# Patient Record
Sex: Female | Born: 1950
Health system: Southern US, Community
[De-identification: ages and names within clinical notes are randomized; demographics above are authoritative.]

## PROBLEM LIST (undated history)

## (undated) DIAGNOSIS — M1990 Unspecified osteoarthritis, unspecified site: Secondary | ICD-10-CM

## (undated) DIAGNOSIS — M259 Joint disorder, unspecified: Secondary | ICD-10-CM

## (undated) DIAGNOSIS — H4311 Vitreous hemorrhage, right eye: Secondary | ICD-10-CM

## (undated) DIAGNOSIS — F4310 Post-traumatic stress disorder, unspecified: Secondary | ICD-10-CM

## (undated) DIAGNOSIS — F410 Panic disorder [episodic paroxysmal anxiety] without agoraphobia: Secondary | ICD-10-CM

## (undated) DIAGNOSIS — K648 Other hemorrhoids: Secondary | ICD-10-CM

## (undated) DIAGNOSIS — G459 Transient cerebral ischemic attack, unspecified: Secondary | ICD-10-CM

## (undated) DIAGNOSIS — R112 Nausea with vomiting, unspecified: Secondary | ICD-10-CM

## (undated) DIAGNOSIS — R51 Headache: Secondary | ICD-10-CM

## (undated) DIAGNOSIS — T4145XA Adverse effect of unspecified anesthetic, initial encounter: Secondary | ICD-10-CM

## (undated) DIAGNOSIS — Z9889 Other specified postprocedural states: Secondary | ICD-10-CM

## (undated) DIAGNOSIS — D509 Iron deficiency anemia, unspecified: Secondary | ICD-10-CM

## (undated) DIAGNOSIS — D649 Anemia, unspecified: Secondary | ICD-10-CM

## (undated) DIAGNOSIS — T8859XA Other complications of anesthesia, initial encounter: Secondary | ICD-10-CM

## (undated) DIAGNOSIS — Z800 Family history of malignant neoplasm of digestive organs: Secondary | ICD-10-CM

## (undated) DIAGNOSIS — M539 Dorsopathy, unspecified: Secondary | ICD-10-CM

## (undated) DIAGNOSIS — K5790 Diverticulosis of intestine, part unspecified, without perforation or abscess without bleeding: Secondary | ICD-10-CM

## (undated) DIAGNOSIS — J45909 Unspecified asthma, uncomplicated: Secondary | ICD-10-CM

## (undated) DIAGNOSIS — D126 Benign neoplasm of colon, unspecified: Secondary | ICD-10-CM

## (undated) DIAGNOSIS — Z803 Family history of malignant neoplasm of breast: Secondary | ICD-10-CM

## (undated) DIAGNOSIS — K219 Gastro-esophageal reflux disease without esophagitis: Secondary | ICD-10-CM

## (undated) DIAGNOSIS — I10 Essential (primary) hypertension: Secondary | ICD-10-CM

## (undated) HISTORY — DX: Benign neoplasm of colon, unspecified: D12.6

## (undated) HISTORY — DX: Dorsopathy, unspecified: M53.9

## (undated) HISTORY — DX: Post-traumatic stress disorder, unspecified: F43.10

## (undated) HISTORY — DX: Family history of malignant neoplasm of digestive organs: Z80.0

## (undated) HISTORY — DX: Other hemorrhoids: K64.8

## (undated) HISTORY — PX: COLONOSCOPY: SHX174

## (undated) HISTORY — DX: Family history of malignant neoplasm of breast: Z80.3

## (undated) HISTORY — DX: Iron deficiency anemia, unspecified: D50.9

## (undated) HISTORY — PX: COLONOSCOPY W/ BIOPSIES AND POLYPECTOMY: SHX1376

## (undated) HISTORY — DX: Diverticulosis of intestine, part unspecified, without perforation or abscess without bleeding: K57.90

## (undated) HISTORY — DX: Transient cerebral ischemic attack, unspecified: G45.9

## (undated) HISTORY — DX: Unspecified asthma, uncomplicated: J45.909

## (undated) HISTORY — PX: DILATION AND CURETTAGE OF UTERUS: SHX78

## (undated) HISTORY — DX: Joint disorder, unspecified: M25.9

## (undated) HISTORY — DX: Panic disorder (episodic paroxysmal anxiety): F41.0

## (undated) HISTORY — PX: BREAST SURGERY: SHX581

## (undated) HISTORY — PX: ABDOMINAL HYSTERECTOMY: SHX81

---

## 1997-09-20 HISTORY — PX: OTHER SURGICAL HISTORY: SHX169

## 2005-02-27 ENCOUNTER — Emergency Department (HOSPITAL_COMMUNITY): Admission: EM | Admit: 2005-02-27 | Discharge: 2005-02-27 | Payer: Self-pay | Admitting: Family Medicine

## 2005-09-06 ENCOUNTER — Emergency Department (HOSPITAL_COMMUNITY): Admission: EM | Admit: 2005-09-06 | Discharge: 2005-09-06 | Payer: Self-pay | Admitting: Family Medicine

## 2005-09-22 ENCOUNTER — Emergency Department (HOSPITAL_COMMUNITY): Admission: EM | Admit: 2005-09-22 | Discharge: 2005-09-22 | Payer: Self-pay | Admitting: Emergency Medicine

## 2005-12-06 ENCOUNTER — Other Ambulatory Visit: Admission: RE | Admit: 2005-12-06 | Discharge: 2005-12-06 | Payer: Self-pay | Admitting: Obstetrics and Gynecology

## 2005-12-15 ENCOUNTER — Encounter: Admission: RE | Admit: 2005-12-15 | Discharge: 2005-12-15 | Payer: Self-pay | Admitting: Obstetrics and Gynecology

## 2007-06-13 ENCOUNTER — Emergency Department (HOSPITAL_COMMUNITY): Admission: EM | Admit: 2007-06-13 | Discharge: 2007-06-13 | Payer: Self-pay | Admitting: Emergency Medicine

## 2007-06-23 ENCOUNTER — Ambulatory Visit: Payer: Self-pay | Admitting: Family Medicine

## 2007-06-29 ENCOUNTER — Ambulatory Visit: Payer: Self-pay | Admitting: *Deleted

## 2007-07-19 ENCOUNTER — Encounter: Admission: RE | Admit: 2007-07-19 | Discharge: 2007-07-19 | Payer: Self-pay | Admitting: Family Medicine

## 2007-07-26 ENCOUNTER — Encounter: Admission: RE | Admit: 2007-07-26 | Discharge: 2007-07-26 | Payer: Self-pay | Admitting: Family Medicine

## 2007-07-26 HISTORY — PX: BREAST BIOPSY: SHX20

## 2007-08-21 HISTORY — PX: BREAST EXCISIONAL BIOPSY: SUR124

## 2007-09-07 ENCOUNTER — Encounter: Admission: RE | Admit: 2007-09-07 | Discharge: 2007-09-07 | Payer: Self-pay | Admitting: General Surgery

## 2007-09-11 ENCOUNTER — Encounter (INDEPENDENT_AMBULATORY_CARE_PROVIDER_SITE_OTHER): Payer: Self-pay | Admitting: General Surgery

## 2007-09-11 ENCOUNTER — Ambulatory Visit (HOSPITAL_BASED_OUTPATIENT_CLINIC_OR_DEPARTMENT_OTHER): Admission: RE | Admit: 2007-09-11 | Discharge: 2007-09-11 | Payer: Self-pay | Admitting: General Surgery

## 2007-09-11 ENCOUNTER — Encounter: Admission: RE | Admit: 2007-09-11 | Discharge: 2007-09-11 | Payer: Self-pay | Admitting: General Surgery

## 2008-07-19 ENCOUNTER — Encounter: Admission: RE | Admit: 2008-07-19 | Discharge: 2008-07-19 | Payer: Self-pay | Admitting: Internal Medicine

## 2008-08-28 ENCOUNTER — Encounter: Admission: RE | Admit: 2008-08-28 | Discharge: 2008-09-19 | Payer: Self-pay | Admitting: Internal Medicine

## 2009-06-29 ENCOUNTER — Emergency Department (HOSPITAL_COMMUNITY): Admission: EM | Admit: 2009-06-29 | Discharge: 2009-06-29 | Payer: Self-pay | Admitting: Emergency Medicine

## 2009-08-19 ENCOUNTER — Ambulatory Visit: Payer: Self-pay | Admitting: Family Medicine

## 2009-08-19 LAB — CONVERTED CEMR LAB
ALT: 10 units/L (ref 0–35)
AST: 13 units/L (ref 0–37)
Albumin: 4.2 g/dL (ref 3.5–5.2)
Basophils Relative: 0 % (ref 0–1)
CO2: 26 meq/L (ref 19–32)
Calcium: 9 mg/dL (ref 8.4–10.5)
Chloride: 107 meq/L (ref 96–112)
Creatinine, Ser: 0.69 mg/dL (ref 0.40–1.20)
Hemoglobin: 12.4 g/dL (ref 12.0–15.0)
Lymphocytes Relative: 23 % (ref 12–46)
Lymphs Abs: 1.4 10*3/uL (ref 0.7–4.0)
MCHC: 31 g/dL (ref 30.0–36.0)
Microalb, Ur: 1.22 mg/dL (ref 0.00–1.89)
Monocytes Absolute: 0.5 10*3/uL (ref 0.1–1.0)
Monocytes Relative: 8 % (ref 3–12)
Neutro Abs: 4.1 10*3/uL (ref 1.7–7.7)
Neutrophils Relative %: 67 % (ref 43–77)
Potassium: 3.7 meq/L (ref 3.5–5.3)
RBC: 4.85 M/uL (ref 3.87–5.11)
Sed Rate: 19 mm/hr (ref 0–22)
Sodium: 146 meq/L — ABNORMAL HIGH (ref 135–145)
TSH: 0.532 microintl units/mL (ref 0.350–4.500)
Total Protein: 6.5 g/dL (ref 6.0–8.3)
WBC: 6.1 10*3/uL (ref 4.0–10.5)

## 2009-10-14 ENCOUNTER — Ambulatory Visit (HOSPITAL_COMMUNITY): Admission: RE | Admit: 2009-10-14 | Discharge: 2009-10-14 | Payer: Self-pay | Admitting: Family Medicine

## 2009-10-14 ENCOUNTER — Ambulatory Visit: Payer: Self-pay | Admitting: Family Medicine

## 2009-11-07 ENCOUNTER — Ambulatory Visit: Payer: Self-pay | Admitting: Family Medicine

## 2009-11-20 ENCOUNTER — Ambulatory Visit (HOSPITAL_COMMUNITY): Admission: RE | Admit: 2009-11-20 | Discharge: 2009-11-20 | Payer: Self-pay | Admitting: Internal Medicine

## 2009-11-20 ENCOUNTER — Ambulatory Visit: Payer: Self-pay | Admitting: Family Medicine

## 2009-11-25 ENCOUNTER — Ambulatory Visit: Payer: Self-pay | Admitting: Family Medicine

## 2009-11-25 ENCOUNTER — Encounter: Admission: RE | Admit: 2009-11-25 | Discharge: 2009-11-25 | Payer: Self-pay | Admitting: Family Medicine

## 2009-11-29 ENCOUNTER — Encounter: Admission: RE | Admit: 2009-11-29 | Discharge: 2009-11-29 | Payer: Self-pay | Admitting: Family Medicine

## 2009-12-17 ENCOUNTER — Ambulatory Visit: Payer: Self-pay | Admitting: Obstetrics and Gynecology

## 2009-12-23 ENCOUNTER — Ambulatory Visit (HOSPITAL_COMMUNITY): Admission: RE | Admit: 2009-12-23 | Discharge: 2009-12-23 | Payer: Self-pay | Admitting: Family Medicine

## 2009-12-25 ENCOUNTER — Ambulatory Visit: Payer: Self-pay | Admitting: Family Medicine

## 2009-12-26 ENCOUNTER — Encounter: Admission: RE | Admit: 2009-12-26 | Discharge: 2009-12-26 | Payer: Self-pay | Admitting: Family Medicine

## 2010-01-13 ENCOUNTER — Encounter: Admission: RE | Admit: 2010-01-13 | Discharge: 2010-02-23 | Payer: Self-pay | Admitting: Family Medicine

## 2010-02-27 ENCOUNTER — Ambulatory Visit: Payer: Self-pay | Admitting: Family Medicine

## 2010-06-22 ENCOUNTER — Encounter: Admission: RE | Admit: 2010-06-22 | Discharge: 2010-06-22 | Payer: Self-pay | Admitting: Family Medicine

## 2011-02-02 NOTE — Op Note (Signed)
NAME:  Sheila Hartman, Sheila Hartman           ACCOUNT NO.:  [id number]   MEDICAL RECORD NO.:  [id number]          PATIENT TYPE:  AMB   LOCATION:  DSC                          FACILITY:  [facility]   PHYSICIAN:  Timothy E. Earlene Plater, M.D. DATE OF BIRTH:  05-05-1951   DATE OF PROCEDURE:  09/11/2007  DATE OF DISCHARGE:  09/11/2007                               OPERATIVE REPORT   PREOP DIAGNOSIS:  Calcifications, left breast.   POSTOPERATIVE DIAGNOSIS:  Calcifications, left breast.   PROCEDURE:  Needle-localized excision calcifications, left breast.   SURGEON:  Timothy E. Earlene Plater, M.D.   ANESTHESIA:  Local standby.   DESCRIPTION OF PROCEDURE:  [name] is 34, and has her first abnormal  mammogram with calcifications, left breast.  The patient is to large to  have a stereotactic biopsy, but she is a candidate for a free hand  needle localization, and surgical excision.  She has agreed, and is here  today for that.   The patient is seen, identified, the left breast marked, and the permit  signed.  She had been at the [facility] for an  excellent needle localization, Dr. Deboraha Sprang.   The patient was taken to the operating room, placed supine.  IV sedation  given.  The left breast was examined.  The bandage and tape removed, the  needle cut to length.  The area prepped and draped with Betadine.  The  wire entered the left breast from the medial aspect, going posterior and  laterally.  This area was measured, compared to the mammograms, and the  skin and subcu were anesthetized with 1/4% Marcaine.  A curvilinear  incision made over the needle entrance and medially.  I followed the  needle through the superficial subcu, and then widened the excision  around the shaft of the needle removing the tissue, the needle, the  shaft, and the point of the localizing wire.  There was a hematoma along  the shaft.  This area was marked #1, deep was marked #2, and superior  was marked #3.  An additional  rim of tissue was taken inferiorly along  the hematoma at the shaft of the needle.   Cautery was used to keep the wound dry; and it was approximated deep and  superficial with Monocryl and Steri-Strips.  I looked at the x-ray, all  of calcifications, and wire were included.  Counts were correct.  She  tolerated it well, and was removed to recovery room in good condition.  Vicodin and instructions given.  She will be followed as an outpatient.      Timothy E. Earlene Plater, M.D.  Electronically Signed     TED/MEDQ  D:  09/11/2007  T:  09/11/2007  Job:  [id number]

## 2011-04-06 ENCOUNTER — Other Ambulatory Visit: Payer: Self-pay | Admitting: Internal Medicine

## 2011-04-06 DIAGNOSIS — R921 Mammographic calcification found on diagnostic imaging of breast: Secondary | ICD-10-CM

## 2011-04-09 ENCOUNTER — Ambulatory Visit
Admission: RE | Admit: 2011-04-09 | Discharge: 2011-04-09 | Disposition: A | Discharge status: Home / Self Care | Payer: Self-pay | Source: Ambulatory Visit | Attending: Internal Medicine | Admitting: Internal Medicine

## 2011-04-09 DIAGNOSIS — R921 Mammographic calcification found on diagnostic imaging of breast: Secondary | ICD-10-CM

## 2011-06-02 ENCOUNTER — Inpatient Hospital Stay (INDEPENDENT_AMBULATORY_CARE_PROVIDER_SITE_OTHER)
Admission: RE | Admit: 2011-06-02 | Discharge: 2011-06-02 | Disposition: A | Discharge status: Home / Self Care | Payer: Self-pay | Source: Ambulatory Visit | Attending: Family Medicine | Admitting: Family Medicine

## 2011-06-02 DIAGNOSIS — R6889 Other general symptoms and signs: Secondary | ICD-10-CM

## 2011-06-02 DIAGNOSIS — I10 Essential (primary) hypertension: Secondary | ICD-10-CM

## 2011-06-02 DIAGNOSIS — M79609 Pain in unspecified limb: Secondary | ICD-10-CM

## 2011-06-02 LAB — POCT I-STAT, CHEM 8
Calcium, Ion: 1.21 mmol/L (ref 1.12–1.32)
Chloride: 110 mEq/L (ref 96–112)
Glucose, Bld: 107 mg/dL — ABNORMAL HIGH (ref 70–99)
HCT: 39 % (ref 36.0–46.0)
TCO2: 22 mmol/L (ref 0–100)

## 2011-06-25 LAB — DIFFERENTIAL
Basophils Relative: 0
Lymphocytes Relative: 23
Lymphs Abs: 1.4
Monocytes Relative: 5
Neutro Abs: 4.3

## 2011-06-25 LAB — CBC
HCT: 39.8
Platelets: 290
RDW: 14.9
WBC: 6.2

## 2011-06-25 LAB — COMPREHENSIVE METABOLIC PANEL
AST: 18
Albumin: 4
BUN: 7
Creatinine, Ser: 0.72
GFR calc Af Amer: >60
Potassium: 4.4
Total Protein: 6.8

## 2012-01-25 ENCOUNTER — Other Ambulatory Visit: Payer: Self-pay | Admitting: Nephrology

## 2012-01-25 DIAGNOSIS — Z1231 Encounter for screening mammogram for malignant neoplasm of breast: Secondary | ICD-10-CM

## 2012-01-27 ENCOUNTER — Ambulatory Visit
Admission: RE | Admit: 2012-01-27 | Discharge: 2012-01-27 | Disposition: A | Discharge status: Home / Self Care | Payer: Medicaid Other | Source: Ambulatory Visit | Attending: Nephrology | Admitting: Nephrology

## 2012-01-27 ENCOUNTER — Other Ambulatory Visit: Payer: Self-pay | Admitting: Nephrology

## 2012-01-27 DIAGNOSIS — R0602 Shortness of breath: Secondary | ICD-10-CM

## 2012-01-27 DIAGNOSIS — M25571 Pain in right ankle and joints of right foot: Secondary | ICD-10-CM

## 2012-02-22 ENCOUNTER — Encounter (HOSPITAL_BASED_OUTPATIENT_CLINIC_OR_DEPARTMENT_OTHER): Payer: Self-pay | Admitting: *Deleted

## 2012-02-22 ENCOUNTER — Observation Stay (HOSPITAL_BASED_OUTPATIENT_CLINIC_OR_DEPARTMENT_OTHER)
Admission: EM | Admit: 2012-02-22 | Discharge: 2012-02-23 | Disposition: A | Discharge status: Home / Self Care | Payer: Medicaid Other | Attending: Internal Medicine | Admitting: Internal Medicine

## 2012-02-22 ENCOUNTER — Emergency Department (HOSPITAL_BASED_OUTPATIENT_CLINIC_OR_DEPARTMENT_OTHER): Payer: Medicaid Other

## 2012-02-22 DIAGNOSIS — R209 Unspecified disturbances of skin sensation: Principal | ICD-10-CM | POA: Insufficient documentation

## 2012-02-22 DIAGNOSIS — Z8673 Personal history of transient ischemic attack (TIA), and cerebral infarction without residual deficits: Secondary | ICD-10-CM | POA: Insufficient documentation

## 2012-02-22 DIAGNOSIS — G459 Transient cerebral ischemic attack, unspecified: Secondary | ICD-10-CM

## 2012-02-22 DIAGNOSIS — I10 Essential (primary) hypertension: Secondary | ICD-10-CM | POA: Insufficient documentation

## 2012-02-22 HISTORY — DX: Essential (primary) hypertension: I10

## 2012-02-22 LAB — CBC
HCT: 39.8 % (ref 36.0–46.0)
MCH: 24.7 pg — ABNORMAL LOW (ref 26.0–34.0)
MCHC: 32.2 g/dL (ref 30.0–36.0)
MCV: 76.7 fL — ABNORMAL LOW (ref 78.0–100.0)
Platelets: 289 10*3/uL (ref 150–400)
RDW: 16.1 % — ABNORMAL HIGH (ref 11.5–15.5)
WBC: 9.3 10*3/uL (ref 4.0–10.5)

## 2012-02-22 LAB — DIFFERENTIAL
Basophils Absolute: 0.1 10*3/uL (ref 0.0–0.1)
Eosinophils Absolute: 0.1 10*3/uL (ref 0.0–0.7)
Eosinophils Relative: 1 % (ref 0–5)
Lymphocytes Relative: 22 % (ref 12–46)
Monocytes Absolute: 0.7 10*3/uL (ref 0.1–1.0)

## 2012-02-22 LAB — BASIC METABOLIC PANEL
CO2: 30 mEq/L (ref 19–32)
Calcium: 10.1 mg/dL (ref 8.4–10.5)
Creatinine, Ser: 0.9 mg/dL (ref 0.50–1.10)
GFR calc non Af Amer: 69 mL/min — ABNORMAL LOW (ref >90)
Sodium: 141 mEq/L (ref 135–145)

## 2012-02-22 NOTE — ED Notes (Signed)
Pt c/o allergic reaction to hydrocodone, numbness to right side of face

## 2012-02-22 NOTE — ED Notes (Signed)
Report received from KP, RN. Assuming care of patient at this time.

## 2012-02-22 NOTE — ED Provider Notes (Signed)
History     CSN: [id number]  Arrival date & time 02/22/12  2111   First MD Initiated Contact with Patient 02/22/12 2152      Chief Complaint  Patient presents with  . Facial numbness     (Consider location/radiation/quality/duration/timing/severity/associated sxs/prior treatment) HPI This is a 61 year old female who had the sudden onset of right facial paresthesias about 7 PM today. Her face did not go completely numb but did have altered sensation. This is accompanied by a feeling of coldness in the right upper extremity without numbness of the right upper extremity. The symptoms abated after about 30 minutes with the exception of a slight area of paresthesia is still remaining at right side of the mouth. There was no headache. There was no motor deficit. There was no speech deficit. She has a history of chronic nystagmus. She also has a history of back pain which requires her to ambulate with a cane. She states her ambulation has not changed. The symptoms are described as mild and she states she only came here at the insistence of her family.  Past Medical History  Diagnosis Date  . Hypertension     Past Surgical History  Procedure Date  . Abdominal hysterectomy   . Joint replacement     History reviewed. No pertinent family history.  History  Substance Use Topics  . Smoking status: Never Smoker   . Smokeless tobacco: Not on file  . Alcohol Use: No    OB History    Grav Para Term Preterm Abortions TAB SAB Ect Mult Living                  Review of Systems  All other systems reviewed and are negative.    Allergies  Penicillins  Home Medications   Current Outpatient Rx  Name Route Sig Dispense Refill  . ACETAMINOPHEN-CODEINE 300-30 MG PO TABS Oral Take 1 tablet by mouth every 4 (four) hours as needed. Patient used this medication for pain.    Marland Kitchen AMLODIPINE BESYLATE 5 MG PO TABS Oral Take 5 mg by mouth daily.    Marland Kitchen BENAZEPRIL HCL 10 MG PO TABS Oral Take 10 mg by  mouth daily.    Marland Kitchen DICLOFENAC SODIUM 75 MG PO TBEC Oral Take 75 mg by mouth 2 (two) times daily.      BP 146/82  Temp(Src) 98.5 F (36.9 C) (Oral)  Resp 16  Ht 5\' 7"  (1.702 m)  Wt 280 lb (127.007 kg)  BMI 43.85 kg/m2  SpO2 100%  Physical Exam General: Well-developed, well-nourished female in no acute distress; appearance consistent with age of record HENT: normocephalic, atraumatic Eyes: pupils equal round and reactive to light; extraocular muscles intact; persistent nystagmus Neck: supple; no bruit Heart: regular rate and rhythm; tachycardic Lungs: clear to auscultation bilaterally Abdomen: soft; nondistended; nontender Extremities: No deformity; full range of motion Neurologic: Awake, alert and oriented; motor function intact in all extremities and symmetric; no facial droop; normal coordination his speech; negative Romberg; antalgic gait Skin: Warm and dry Psychiatric: Normal mood and affect    ED Course  Procedures (including critical care time)     MDM   Nursing notes and vitals signs, including pulse oximetry, reviewed.  Summary of this visit's results, reviewed by myself:  Labs:  Results for orders placed during the hospital encounter of 02/22/12  CBC      Component Value Range   WBC 9.3  4.0 - 10.5 (K/uL)   RBC 5.19 (*) 3.87 -  5.11 (MIL/uL)   Hemoglobin 12.8  12.0 - 15.0 (g/dL)   HCT 40.9  81.1 - 91.4 (%)   MCV 76.7 (*) 78.0 - 100.0 (fL)   MCH 24.7 (*) 26.0 - 34.0 (pg)   MCHC 32.2  30.0 - 36.0 (g/dL)   RDW 78.2 (*) 95.6 - 15.5 (%)   Platelets 289  150 - 400 (K/uL)  DIFFERENTIAL      Component Value Range   Neutrophils Relative 70  43 - 77 (%)   Neutro Abs 6.5  1.7 - 7.7 (K/uL)   Lymphocytes Relative 22  12 - 46 (%)   Lymphs Abs 2.0  0.7 - 4.0 (K/uL)   Monocytes Relative 8  3 - 12 (%)   Monocytes Absolute 0.7  0.1 - 1.0 (K/uL)   Eosinophils Relative 1  0 - 5 (%)   Eosinophils Absolute 0.1  0.0 - 0.7 (K/uL)   Basophils Relative 1  0 - 1 (%)    Basophils Absolute 0.1  0.0 - 0.1 (K/uL)  BASIC METABOLIC PANEL      Component Value Range   Sodium 141  135 - 145 (mEq/L)   Potassium 3.8  3.5 - 5.1 (mEq/L)   Chloride 106  96 - 112 (mEq/L)   CO2 30  19 - 32 (mEq/L)   Glucose, Bld 131 (*) 70 - 99 (mg/dL)   BUN 17  6 - 23 (mg/dL)   Creatinine, Ser 2.13  0.50 - 1.10 (mg/dL)   Calcium 08.6  8.4 - 10.5 (mg/dL)   GFR calc non Af Amer 69 (*) >90 (mL/min)   GFR calc Af Amer 80 (*) >90 (mL/min)    Imaging Studies: Ct Head Wo Contrast  02/22/2012  *RADIOLOGY REPORT*  Clinical Data: Right-sided facial numbness and tingling.  CT HEAD WITHOUT CONTRAST  Technique:  Contiguous axial images were obtained from the base of the skull through the vertex without contrast.  Comparison: None  Findings: The ventricles are normal.  No extra-axial fluid collections are seen.  The brainstem and cerebellum are unremarkable.  No acute intracranial findings such as infarction or hemorrhage.  No mass lesions.  The bony calvarium is intact.  The visualized paranasal sinuses and mastoid air cells are clear.  IMPRESSION: No acute intracranial findings or mass lesions.  Original Report Authenticated By: P. Loralie Champagne, M.D.      EKG Interpretation:  Date & Time: 02/22/2012 10:13 PM  Rate: 104  Rhythm: sinus tachycardia  QRS Axis: right  Intervals: normal  ST/T Wave abnormalities: normal  Conduction Disutrbances:none  Narrative Interpretation:   Old EKG Reviewed: Rate is faster; axis is changed  12:25 AM We'll admit for TIA workup.        Hanley Seamen, MD 02/23/12 [phone]

## 2012-02-23 ENCOUNTER — Encounter (HOSPITAL_COMMUNITY): Payer: Self-pay | Admitting: Internal Medicine

## 2012-02-23 ENCOUNTER — Observation Stay (HOSPITAL_COMMUNITY): Payer: Medicaid Other

## 2012-02-23 DIAGNOSIS — I10 Essential (primary) hypertension: Secondary | ICD-10-CM

## 2012-02-23 DIAGNOSIS — G459 Transient cerebral ischemic attack, unspecified: Secondary | ICD-10-CM

## 2012-02-23 DIAGNOSIS — I517 Cardiomegaly: Secondary | ICD-10-CM

## 2012-02-23 LAB — RAPID URINE DRUG SCREEN, HOSP PERFORMED
Amphetamines: NOT DETECTED
Barbiturates: NOT DETECTED
Benzodiazepines: NOT DETECTED
Cocaine: NOT DETECTED
Tetrahydrocannabinol: NOT DETECTED

## 2012-02-23 LAB — COMPREHENSIVE METABOLIC PANEL
Alkaline Phosphatase: 83 U/L (ref 39–117)
BUN: 13 mg/dL (ref 6–23)
CO2: 27 mEq/L (ref 19–32)
Chloride: 106 mEq/L (ref 96–112)
GFR calc Af Amer: >90 mL/min (ref >90)
GFR calc non Af Amer: >90 mL/min (ref >90)
Glucose, Bld: 115 mg/dL — ABNORMAL HIGH (ref 70–99)
Potassium: 3.6 mEq/L (ref 3.5–5.1)
Total Bilirubin: 0.3 mg/dL (ref 0.3–1.2)
Total Protein: 7 g/dL (ref 6.0–8.3)

## 2012-02-23 LAB — CBC
HCT: 38.3 % (ref 36.0–46.0)
MCH: 24.8 pg — ABNORMAL LOW (ref 26.0–34.0)
MCV: 78 fL (ref 78.0–100.0)
WBC: 6.7 10*3/uL (ref 4.0–10.5)

## 2012-02-23 LAB — LIPID PANEL
Cholesterol: 151 mg/dL (ref 0–200)
LDL Cholesterol: 92 mg/dL (ref 0–99)
Triglycerides: 90 mg/dL (ref <150)
VLDL: 18 mg/dL (ref 0–40)

## 2012-02-23 LAB — GLUCOSE, CAPILLARY

## 2012-02-23 MED ORDER — ASPIRIN 325 MG PO TABS
325.0000 mg | ORAL_TABLET | Freq: Every day | ORAL | Status: DC
  Administered 2012-02-23: 325 mg via ORAL
  Filled 2012-02-23: qty 1

## 2012-02-23 MED ORDER — SODIUM CHLORIDE 0.9 % IV SOLN
INTRAVENOUS | Status: DC
  Administered 2012-02-23: 05:00:00 via INTRAVENOUS

## 2012-02-23 MED ORDER — BENAZEPRIL HCL 10 MG PO TABS
10.0000 mg | ORAL_TABLET | Freq: Every day | ORAL | Status: DC
  Administered 2012-02-23: 10 mg via ORAL
  Filled 2012-02-23: qty 1

## 2012-02-23 MED ORDER — ENOXAPARIN SODIUM 40 MG/0.4ML ~~LOC~~ SOLN
40.0000 mg | Freq: Every day | SUBCUTANEOUS | Status: DC
  Administered 2012-02-23: 40 mg via SUBCUTANEOUS
  Filled 2012-02-23: qty 0.4

## 2012-02-23 MED ORDER — SODIUM CHLORIDE 0.9 % IV SOLN: INTRAVENOUS | Status: AC

## 2012-02-23 MED ORDER — ACETAMINOPHEN-CODEINE #3 300-30 MG PO TABS: 1.0000 | ORAL_TABLET | ORAL | Status: DC | PRN

## 2012-02-23 MED ORDER — LORAZEPAM 1 MG PO TABS: 1.0000 mg | ORAL_TABLET | Freq: Once | ORAL | Status: DC

## 2012-02-23 MED ORDER — ASPIRIN 325 MG PO TABS: 325.0000 mg | ORAL_TABLET | Freq: Every day | ORAL | Status: DC

## 2012-02-23 MED ORDER — AMLODIPINE BESYLATE 5 MG PO TABS
5.0000 mg | ORAL_TABLET | Freq: Every day | ORAL | Status: DC
  Administered 2012-02-23: 5 mg via ORAL
  Filled 2012-02-23: qty 1

## 2012-02-23 MED ORDER — SODIUM CHLORIDE 0.9 % IV SOLN
INTRAVENOUS | Status: AC
  Administered 2012-02-23: 03:00:00 via INTRAVENOUS

## 2012-02-23 MED ORDER — LORAZEPAM 2 MG/ML IJ SOLN
1.0000 mg | Freq: Once | INTRAMUSCULAR | Status: AC
  Administered 2012-02-23: 1 mg via INTRAVENOUS
  Filled 2012-02-23: qty 1

## 2012-02-23 NOTE — H&P (Signed)
Sheila Hartman is an 61 y.o. female.   PCP - Dr.Mayo in [facility]. Chief Complaint: Tingling and numbness of the face and hand. HPI: 61 year-old female history of hypertension last evening around 7 to 7:30 PM while watching TV felt her right hand gets numb with tingling and also her right facial area. The whole episode lasted for 15 minutes and got completely resolved. Denies any headache, blurred vision, weakness of her upper or lower extremities, difficulty speaking or swallowing. By the time she reached ER her symptoms have resolved. CT head was negative and patient is admitted for further observation and management for possible TIA. Patient denies having similar symptoms previously. Denies any chest pain, shortness of breath or dizziness.  Past Medical History  Diagnosis Date  . Hypertension     Past Surgical History  Procedure Date  . Abdominal hysterectomy   . Joint replacement     History reviewed. No pertinent family history. Social History:  reports that she has never smoked. She does not have any smokeless tobacco history on file. She reports that she does not drink alcohol or use illicit drugs.  Allergies:  Allergies  Allergen Reactions  . Penicillins Hives    Medications Prior to Admission  Medication Sig Dispense Refill  . Acetaminophen-Codeine 300-30 MG per tablet Take 1 tablet by mouth every 4 (four) hours as needed. Patient used this medication for pain.      Marland Kitchen amLODipine (NORVASC) 5 MG tablet Take 5 mg by mouth daily.      . benazepril (LOTENSIN) 10 MG tablet Take 10 mg by mouth daily.      . diclofenac (VOLTAREN) 75 MG EC tablet Take 75 mg by mouth 2 (two) times daily.        Results for orders placed during the hospital encounter of 02/22/12 (from the past 48 hour(s))  CBC     Status: Abnormal   Collection Time   02/22/12 10:13 PM      Component Value Range Comment   WBC 9.3  4.0 - 10.5 (K/uL)    RBC 5.19 (*) 3.87 - 5.11 (MIL/uL)    Hemoglobin 12.8  12.0  - 15.0 (g/dL)    HCT 16.1  09.6 - 04.5 (%)    MCV 76.7 (*) 78.0 - 100.0 (fL)    MCH 24.7 (*) 26.0 - 34.0 (pg)    MCHC 32.2  30.0 - 36.0 (g/dL)    RDW 40.9 (*) 81.1 - 15.5 (%)    Platelets 289  150 - 400 (K/uL)   DIFFERENTIAL     Status: Normal   Collection Time   02/22/12 10:13 PM      Component Value Range Comment   Neutrophils Relative 70  43 - 77 (%)    Neutro Abs 6.5  1.7 - 7.7 (K/uL)    Lymphocytes Relative 22  12 - 46 (%)    Lymphs Abs 2.0  0.7 - 4.0 (K/uL)    Monocytes Relative 8  3 - 12 (%)    Monocytes Absolute 0.7  0.1 - 1.0 (K/uL)    Eosinophils Relative 1  0 - 5 (%)    Eosinophils Absolute 0.1  0.0 - 0.7 (K/uL)    Basophils Relative 1  0 - 1 (%)    Basophils Absolute 0.1  0.0 - 0.1 (K/uL)   BASIC METABOLIC PANEL     Status: Abnormal   Collection Time   02/22/12 10:13 PM      Component Value Range Comment  Sodium 141  135 - 145 (mEq/L)    Potassium 3.8  3.5 - 5.1 (mEq/L)    Chloride 106  96 - 112 (mEq/L)    CO2 30  19 - 32 (mEq/L)    Glucose, Bld 131 (*) 70 - 99 (mg/dL)    BUN 17  6 - 23 (mg/dL)    Creatinine, Ser 1.61  0.50 - 1.10 (mg/dL)    Calcium 09.6  8.4 - 10.5 (mg/dL)    GFR calc non Af Amer 69 (*) >90 (mL/min)    GFR calc Af Amer 80 (*) >90 (mL/min)    Ct Head Wo Contrast  02/22/2012  *RADIOLOGY REPORT*  Clinical Data: Right-sided facial numbness and tingling.  CT HEAD WITHOUT CONTRAST  Technique:  Contiguous axial images were obtained from the base of the skull through the vertex without contrast.  Comparison: None  Findings: The ventricles are normal.  No extra-axial fluid collections are seen.  The brainstem and cerebellum are unremarkable.  No acute intracranial findings such as infarction or hemorrhage.  No mass lesions.  The bony calvarium is intact.  The visualized paranasal sinuses and mastoid air cells are clear.  IMPRESSION: No acute intracranial findings or mass lesions.  Original Report Authenticated By: P. Loralie Champagne, M.D.    Review of Systems    Constitutional: Negative.   HENT: Negative.   Eyes: Negative.   Respiratory: Negative.   Cardiovascular: Negative.   Gastrointestinal: Negative.   Genitourinary: Negative.   Musculoskeletal: Negative.   Skin: Negative.   Neurological: Positive for tingling (Numbness and tingling in the right side of face and right hand.).  Endo/Heme/Allergies: Negative.   Psychiatric/Behavioral: Negative.     Blood pressure 126/86, pulse 88, temperature 98.5 F (36.9 C), temperature source Oral, resp. rate 18, height 5\' 7"  (1.702 m), weight 127.007 kg (280 lb), SpO2 100.00%. Physical Exam  Constitutional: She is oriented to person, place, and time. She appears well-developed and well-nourished. No distress.  HENT:  Head: Normocephalic and atraumatic.  Right Ear: External ear normal.  Left Ear: External ear normal.  Nose: Nose normal.  Mouth/Throat: Oropharynx is clear and moist. No oropharyngeal exudate.  Eyes: Conjunctivae are normal. Pupils are equal, round, and reactive to light. Right eye exhibits no discharge. Left eye exhibits no discharge. No scleral icterus.  Neck: Normal range of motion. Neck supple.  Cardiovascular: Normal rate and regular rhythm.   Respiratory: Effort normal and breath sounds normal. No respiratory distress. She has no wheezes. She has no rales.  GI: Soft. Bowel sounds are normal. She exhibits no distension. There is no tenderness. There is no rebound.  Musculoskeletal: Normal range of motion. She exhibits no edema and no tenderness.  Neurological: She is alert and oriented to person, place, and time.       No facial asymmetry. Tongue is midline. Moves all extremities 5/5.  Skin: Skin is warm and dry. She is not diaphoretic.  Psychiatric: Her behavior is normal.     Assessment/Plan #1. TIA - place patient on neuro checks, swallow evaluation, MRI/MRA brain, carotid Dopplers, 2-D echo. Aspirin. I have ordered 1 mg of Ativan one hour prior to MRI due to history of  claustrophobia #2. Hypertension - continue present medication. #3. Family history of stroke in both her parents.  CODE STATUS - full code.  Athziri Freundlich N. 02/23/2012, 3:54 AM

## 2012-02-23 NOTE — Progress Notes (Addendum)
*  PRELIMINARY RESULTS* Vascular Ultrasound Carotid Duplex (Doppler) has been completed.  No evidence of internal carotid artery stenosis bilaterally. Bilateral antegrade vertebral artery stenosis.  Malachy Moan, RDMS, RDCS 02/23/2012, 1:55 PM

## 2012-02-23 NOTE — ED Notes (Signed)
Pt refuses to change into a gown. States she will put one on once she arrives to the hospital. States she will allow [facility] to place pt on monitor upon arrival "but i dont see the point in you doing all that right now".

## 2012-02-23 NOTE — Care Management Note (Addendum)
    Page 1 of 1   02/24/2012     3:52:11 PM   CARE MANAGEMENT NOTE 02/24/2012  Patient:  Sheila Hartman,Sheila Hartman   Account Number:  [id number]  Date Initiated:  02/23/2012  Documentation initiated by:  Onnie Boer  Subjective/Objective Assessment:   PT WAS ADMITTED WITH TIA     Action/Plan:   PROGRESSION OF CARE AND DISCHARGE PLANNING   Anticipated DC Date:  02/25/2012   Anticipated DC Plan:  HOME W HOME HEALTH SERVICES      DC Planning Services  CM consult      Choice offered to / List presented to:             Status of service:  Completed, signed off Medicare Important Message given?   (If response is "NO", the following Medicare IM given date fields will be blank) Date Medicare IM given:   Date Additional Medicare IM given:    Discharge Disposition:  HOME/SELF CARE  Per UR Regulation:  Reviewed for med. necessity/level of care/duration of stay  If discussed at Long Length of Stay Meetings, dates discussed:    Comments:  02/23/12 Onnie Boer, RN, BSN 1551 PT DC'D TO HOME WITH SELF CARE  02/23/12 Onnie Boer, RN, BSN 1002 PT WAS ADMITTED WITH TIA.  PTA PT WAS AT HOME WITH SELF CARE.  WILL F/U ON DC NEEDS.

## 2012-02-23 NOTE — ED Notes (Signed)
Pt in agreement with plan to be admitted to [facility].

## 2012-02-23 NOTE — Progress Notes (Signed)
  Echocardiogram 2D Echocardiogram has been performed.  Jorje Guild [facility] 02/23/2012, 11:10 AM

## 2012-02-24 LAB — GLUCOSE, CAPILLARY: Glucose-Capillary: 97 mg/dL (ref 70–99)

## 2012-03-08 NOTE — Discharge Summary (Signed)
Physician Discharge Summary  Patient ID: Sheila Hartman MRN: [id number] DOB/AGE: [id number] 59 y.o.  Admit date: 02/22/2012 Discharge date: 03/08/2012  Primary Care Physician:  [facility]   Discharge Diagnoses:    Principal Problem:  *TIA (transient ischemic attack)  HTN (hypertension)    Medication List  As of 03/08/2012  7:46 PM   STOP taking these medications         diclofenac 75 MG EC tablet         TAKE these medications         Acetaminophen-Codeine 300-30 MG per tablet   Take 1 tablet by mouth every 4 (four) hours as needed. Patient used this medication for pain.      amLODipine 5 MG tablet   Commonly known as: NORVASC   Take 5 mg by mouth daily.      aspirin 325 MG tablet   Take 1 tablet (325 mg total) by mouth daily.      benazepril 10 MG tablet   Commonly known as: LOTENSIN   Take 10 mg by mouth daily.           Disposition and Follow-up:  PCP in 1 week  Significant Diagnostic Studies:   MRI HEAD IMPRESSION: No acute intracranial abnormality. Scattered foci of white matter signal abnormality could represent early chronic microvascular ischemic change.  MRA HEAD IMPRESSION: Negative MRA intracranial.  2D echo 6/5: Study Conclusions Left ventricle: The cavity size was normal. Wall thickness was increased in a pattern of mild LVH. Systolic function was vigorous. The estimated ejection fraction was in the range of 65% to 70%.   Carotid duplex No significant extracranial carotid artery stenosis demonstrated. Vertebrals are patent with antegrade flow.   Brief H and P:  Chief Complaint: Tingling and numbness of the face and hand.  HPI: 61 year-old female history of hypertension last evening around 7 to 7:30 PM while watching TV felt her right hand gets numb with tingling and also her right facial area. The whole episode lasted for 15 minutes and got completely resolved. Denies any headache, blurred vision, weakness of her upper or lower  extremities, difficulty speaking or swallowing. By the time she reached ER her symptoms have resolved. CT head was negative and patient is admitted for further observation and management for possible TIA. Patient denies having similar symptoms previously. Denies any chest pain, shortness of breath or dizziness.  Hospital Course:  #1. TIA - symptoms completely resolved prior to admission, underwent TIA/CVA  Workup as noted above, MRI/MRA/ECHO/Carotid duplex were unremarkable Started on ASA for secondary prevention   #2. Hypertension - continue present medication.   Time spent on Discharge:  Signed: Deiondra Denley [facility]  03/08/2012, 7:46 PM

## 2012-04-10 ENCOUNTER — Ambulatory Visit: Payer: Self-pay

## 2012-06-29 ENCOUNTER — Ambulatory Visit (INDEPENDENT_AMBULATORY_CARE_PROVIDER_SITE_OTHER): Payer: Medicaid Other | Admitting: Family Medicine

## 2012-06-29 ENCOUNTER — Encounter: Payer: Self-pay | Admitting: Family Medicine

## 2012-06-29 VITALS — BP 145/85 | HR 101 | Temp 98.1°F | Ht 68.0 in | Wt 294.0 lb

## 2012-06-29 DIAGNOSIS — M25559 Pain in unspecified hip: Secondary | ICD-10-CM | POA: Insufficient documentation

## 2012-06-29 DIAGNOSIS — G459 Transient cerebral ischemic attack, unspecified: Secondary | ICD-10-CM

## 2012-06-29 DIAGNOSIS — I10 Essential (primary) hypertension: Secondary | ICD-10-CM

## 2012-06-29 MED ORDER — DICLOFENAC SODIUM 75 MG PO TBEC: 75.0000 mg | DELAYED_RELEASE_TABLET | Freq: Two times a day (BID) | ORAL | Status: DC

## 2012-06-29 MED ORDER — AMLODIPINE BESYLATE 5 MG PO TABS: 5.0000 mg | ORAL_TABLET | Freq: Every day | ORAL | Status: DC

## 2012-06-29 MED ORDER — BENAZEPRIL HCL 10 MG PO TABS: 10.0000 mg | ORAL_TABLET | Freq: Every day | ORAL | Status: DC

## 2012-06-29 NOTE — Assessment & Plan Note (Signed)
Slightly above goal today. Patient states she typically is well controlled on Norvasc 5mg  daily and Lotensin 10mg . Will continue these medications for now and follow up in 3 months. If remains high, will increase Lotensin to 20mg  daily.

## 2012-06-29 NOTE — Assessment & Plan Note (Signed)
No symptoms. Lipid panel and A1C good. Will continue ASA, but can take 81mg  if she prefers. Will aim for better BP control, and encourage diet and exercise.

## 2012-06-29 NOTE — Patient Instructions (Addendum)
It was nice to meet you today! I hope your surgery does well. I will send the medical clearance to your hip doctor.  Please come back to see me in 3 month.  Adriane Gabbert M. Santoria Chason, M.D.

## 2012-06-29 NOTE — Assessment & Plan Note (Signed)
Medically clear from my standpoint for surgery. Will need to d/c ASA prior to procedure. Continue antihypertensives.
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
## 2012-07-21 ENCOUNTER — Telehealth: Payer: Self-pay | Admitting: *Deleted

## 2012-07-21 NOTE — Telephone Encounter (Signed)
Sheila Hartman with [facility] calling for our NPI.  Patient is having a Total Hip Replacement on 08/08/2012 with Dr. Charlann Boxer.  NPI authorization number given.  Ileana Ladd

## 2012-07-31 ENCOUNTER — Encounter (HOSPITAL_COMMUNITY): Payer: Self-pay

## 2012-08-02 ENCOUNTER — Ambulatory Visit
Admission: RE | Admit: 2012-08-02 | Discharge: 2012-08-02 | Disposition: A | Discharge status: Home / Self Care | Payer: Medicaid Other | Source: Ambulatory Visit | Attending: Nephrology | Admitting: Nephrology

## 2012-08-02 ENCOUNTER — Encounter (HOSPITAL_COMMUNITY)
Admission: RE | Admit: 2012-08-02 | Discharge: 2012-08-02 | Disposition: A | Discharge status: Home / Self Care | Payer: Medicaid Other | Source: Ambulatory Visit | Attending: Orthopedic Surgery | Admitting: Orthopedic Surgery

## 2012-08-02 ENCOUNTER — Encounter (HOSPITAL_COMMUNITY): Payer: Self-pay

## 2012-08-02 DIAGNOSIS — Z1231 Encounter for screening mammogram for malignant neoplasm of breast: Secondary | ICD-10-CM

## 2012-08-02 HISTORY — DX: Unspecified osteoarthritis, unspecified site: M19.90

## 2012-08-02 HISTORY — DX: Gastro-esophageal reflux disease without esophagitis: K21.9

## 2012-08-02 LAB — BASIC METABOLIC PANEL
GFR calc Af Amer: 86 mL/min — ABNORMAL LOW (ref >90)
GFR calc non Af Amer: 75 mL/min — ABNORMAL LOW (ref >90)
Potassium: 3.2 mEq/L — ABNORMAL LOW (ref 3.5–5.1)
Sodium: 143 mEq/L (ref 135–145)

## 2012-08-02 LAB — PROTIME-INR
INR: 0.93 (ref 0.00–1.49)
Prothrombin Time: 12.4 seconds (ref 11.6–15.2)

## 2012-08-02 LAB — URINALYSIS, ROUTINE W REFLEX MICROSCOPIC
Bilirubin Urine: NEGATIVE
Hgb urine dipstick: NEGATIVE
Ketones, ur: NEGATIVE mg/dL
Nitrite: NEGATIVE
Urobilinogen, UA: 1 mg/dL (ref 0.0–1.0)
pH: 5 (ref 5.0–8.0)

## 2012-08-02 LAB — CBC
Hemoglobin: 12.7 g/dL (ref 12.0–15.0)
RBC: 5.17 MIL/uL — ABNORMAL HIGH (ref 3.87–5.11)

## 2012-08-02 LAB — APTT: aPTT: 38 seconds — ABNORMAL HIGH (ref 24–37)

## 2012-08-02 LAB — SURGICAL PCR SCREEN
MRSA, PCR: NEGATIVE
Staphylococcus aureus: NEGATIVE

## 2012-08-02 NOTE — Pre-Procedure Instructions (Addendum)
CBC, BMET, PT, PTT, UA, T/S WERE DONE TODAY-PREOP AT [facility] AS PER ORDERS DR. Charlann Boxer.  PT HAS CXR 01/27/12 AND EKG 02/22/12 - REPORTS IN EPIC.  PT REPORTS ALLERGY TO PENICILLIN-HIVES.  I FAXED A NOTE TO DR. Nilsa Nutting OFFICE TO NOTIFY HIM OF PT'S ALLERGY AND THAT ANCEF IS ORDERED PREOP-DOES HE WANT TO GIVE SOME OTHER ANTIBIOTIC?

## 2012-08-02 NOTE — Patient Instructions (Signed)
YOUR SURGERY IS SCHEDULED AT [facility]  ON:  Tuesday  11/19  AT 12:10 PM  REPORT TO  SHORT STAY CENTER AT:  9:30 AM      PHONE # FOR SHORT STAY IS [phone]  DO NOT EAT OR DRINK ANYTHING AFTER MIDNIGHT THE NIGHT BEFORE YOUR SURGERY.  YOU MAY BRUSH YOUR TEETH, RINSE OUT YOUR MOUTH--BUT NO WATER, NO FOOD, NO CHEWING GUM, NO MINTS, NO CANDIES, NO CHEWING TOBACCO.  PLEASE TAKE THE FOLLOWING MEDICATIONS THE AM OF YOUR SURGERY WITH A FEW SIPS OF WATER:  AMLODIPINE   IF YOU USE INHALERS--USE YOUR INHALERS THE AM OF YOUR SURGERY AND BRING INHALERS TO THE HOSPITAL -TAKE TO SURGERY.    IF YOU ARE DIABETIC:  DO NOT TAKE ANY DIABETIC MEDICATIONS THE AM OF YOUR SURGERY.  IF YOU TAKE INSULIN IN THE EVENINGS--PLEASE ONLY TAKE 1/2 NORMAL EVENING DOSE THE NIGHT BEFORE YOUR SURGERY.  NO INSULIN THE AM OF YOUR SURGERY.  IF YOU HAVE SLEEP APNEA AND USE CPAP OR BIPAP--PLEASE BRING THE MASK AND THE TUBING.  DO NOT BRING YOUR MACHINE.  DO NOT BRING VALUABLES, MONEY, CREDIT CARDS.  DO NOT WEAR JEWELRY, MAKE-UP, NAIL POLISH AND NO METAL PINS OR CLIPS IN YOUR HAIR. CONTACT LENS, DENTURES / PARTIALS, GLASSES SHOULD NOT BE WORN TO SURGERY AND IN MOST CASES-HEARING AIDS WILL NEED TO BE REMOVED.  BRING YOUR GLASSES CASE, ANY EQUIPMENT NEEDED FOR YOUR CONTACT LENS. FOR PATIENTS ADMITTED TO THE HOSPITAL--CHECK OUT TIME THE DAY OF DISCHARGE IS 11:00 AM.  ALL INPATIENT ROOMS ARE PRIVATE - WITH BATHROOM, TELEPHONE, TELEVISION AND WIFI INTERNET.  IF YOU ARE BEING DISCHARGED THE SAME DAY OF YOUR SURGERY--YOU CAN NOT DRIVE YOURSELF HOME--AND SHOULD NOT GO HOME ALONE BY TAXI OR BUS.  NO DRIVING OR OPERATING MACHINERY FOR 24 HOURS FOLLOWING ANESTHESIA / PAIN MEDICATIONS.  PLEASE MAKE ARRANGEMENTS FOR SOMEONE TO BE WITH YOU AT HOME THE FIRST 24 HOURS AFTER SURGERY. RESPONSIBLE DRIVER'S NAME___________________________                                               PHONE #   _______________________                          PLEASE READ OVER ANY  FACT SHEETS THAT YOU WERE GIVEN: MRSA INFORMATION, BLOOD TRANSFUSION INFORMATION, INCENTIVE SPIROMETER INFORMATION.
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
## 2012-08-08 ENCOUNTER — Inpatient Hospital Stay (HOSPITAL_COMMUNITY): Payer: Medicaid Other

## 2012-08-08 ENCOUNTER — Inpatient Hospital Stay (HOSPITAL_COMMUNITY)
Admission: RE | Admit: 2012-08-08 | Discharge: 2012-08-10 | DRG: 470 | Disposition: A | Discharge status: Home Health | Payer: Medicaid Other | Source: Ambulatory Visit | Attending: Orthopedic Surgery | Admitting: Orthopedic Surgery

## 2012-08-08 ENCOUNTER — Inpatient Hospital Stay (HOSPITAL_COMMUNITY): Payer: Medicaid Other | Admitting: *Deleted

## 2012-08-08 ENCOUNTER — Encounter (HOSPITAL_COMMUNITY): Payer: Self-pay | Admitting: *Deleted

## 2012-08-08 ENCOUNTER — Encounter (HOSPITAL_COMMUNITY)
Admission: RE | Disposition: A | Discharge status: Home Health | Payer: Self-pay | Source: Ambulatory Visit | Attending: Orthopedic Surgery

## 2012-08-08 DIAGNOSIS — Z6841 Body Mass Index (BMI) 40.0 and over, adult: Secondary | ICD-10-CM

## 2012-08-08 DIAGNOSIS — M1610 Unilateral primary osteoarthritis, unspecified hip: Principal | ICD-10-CM | POA: Diagnosis present

## 2012-08-08 DIAGNOSIS — I10 Essential (primary) hypertension: Secondary | ICD-10-CM | POA: Diagnosis present

## 2012-08-08 DIAGNOSIS — D62 Acute posthemorrhagic anemia: Secondary | ICD-10-CM | POA: Diagnosis not present

## 2012-08-08 DIAGNOSIS — K219 Gastro-esophageal reflux disease without esophagitis: Secondary | ICD-10-CM | POA: Diagnosis present

## 2012-08-08 DIAGNOSIS — Z96649 Presence of unspecified artificial hip joint: Secondary | ICD-10-CM

## 2012-08-08 DIAGNOSIS — F411 Generalized anxiety disorder: Secondary | ICD-10-CM | POA: Diagnosis present

## 2012-08-08 DIAGNOSIS — D500 Iron deficiency anemia secondary to blood loss (chronic): Secondary | ICD-10-CM

## 2012-08-08 DIAGNOSIS — M169 Osteoarthritis of hip, unspecified: Principal | ICD-10-CM | POA: Diagnosis present

## 2012-08-08 DIAGNOSIS — Z01812 Encounter for preprocedural laboratory examination: Secondary | ICD-10-CM

## 2012-08-08 HISTORY — PX: TOTAL HIP ARTHROPLASTY: SHX124

## 2012-08-08 LAB — TYPE AND SCREEN: Antibody Screen: NEGATIVE

## 2012-08-08 LAB — HEMOGLOBIN AND HEMATOCRIT, BLOOD: HCT: 28.3 % — ABNORMAL LOW (ref 36.0–46.0)

## 2012-08-08 SURGERY — ARTHROPLASTY, HIP, TOTAL, ANTERIOR APPROACH
Anesthesia: General | Site: Hip | Laterality: Right | Wound class: Clean

## 2012-08-08 MED ORDER — BISACODYL 10 MG RE SUPP: 10.0000 mg | Freq: Every day | RECTAL | Status: DC | PRN

## 2012-08-08 MED ORDER — MIDAZOLAM HCL 5 MG/5ML IJ SOLN
INTRAMUSCULAR | Status: DC | PRN
  Administered 2012-08-08: 2 mg via INTRAVENOUS

## 2012-08-08 MED ORDER — EPHEDRINE SULFATE 50 MG/ML IJ SOLN
5.0000 mg | Freq: Once | INTRAMUSCULAR | Status: AC
  Administered 2012-08-08: 5 mg via INTRAVENOUS

## 2012-08-08 MED ORDER — ACETAMINOPHEN 10 MG/ML IV SOLN
INTRAVENOUS | Status: DC | PRN
  Administered 2012-08-08: 1000 mg via INTRAVENOUS

## 2012-08-08 MED ORDER — HYDROMORPHONE HCL PF 1 MG/ML IJ SOLN: 0.5000 mg | INTRAMUSCULAR | Status: DC | PRN

## 2012-08-08 MED ORDER — METHOCARBAMOL 100 MG/ML IJ SOLN
500.0000 mg | Freq: Four times a day (QID) | INTRAMUSCULAR | Status: DC | PRN
  Administered 2012-08-08: 500 mg via INTRAVENOUS
  Filled 2012-08-08: qty 5

## 2012-08-08 MED ORDER — LABETALOL HCL 5 MG/ML IV SOLN
INTRAVENOUS | Status: DC | PRN
  Administered 2012-08-08 (×2): 5 mg via INTRAVENOUS

## 2012-08-08 MED ORDER — HETASTARCH-ELECTROLYTES 6 % IV SOLN
INTRAVENOUS | Status: DC | PRN
  Administered 2012-08-08: 14:00:00 via INTRAVENOUS

## 2012-08-08 MED ORDER — ALUM & MAG HYDROXIDE-SIMETH 200-200-20 MG/5ML PO SUSP: 30.0000 mL | ORAL | Status: DC | PRN

## 2012-08-08 MED ORDER — PHENOL 1.4 % MT LIQD: 1.0000 | OROMUCOSAL | Status: DC | PRN

## 2012-08-08 MED ORDER — METOCLOPRAMIDE HCL 5 MG/ML IJ SOLN: 5.0000 mg | Freq: Three times a day (TID) | INTRAMUSCULAR | Status: DC | PRN

## 2012-08-08 MED ORDER — MENTHOL 3 MG MT LOZG: 1.0000 | LOZENGE | OROMUCOSAL | Status: DC | PRN

## 2012-08-08 MED ORDER — DEXAMETHASONE SODIUM PHOSPHATE 4 MG/ML IJ SOLN
INTRAMUSCULAR | Status: DC | PRN
  Administered 2012-08-08: 10 mg via INTRAVENOUS

## 2012-08-08 MED ORDER — CLINDAMYCIN PHOSPHATE 900 MG/50ML IV SOLN
900.0000 mg | INTRAVENOUS | Status: AC
  Administered 2012-08-08: 900 mg via INTRAVENOUS
  Filled 2012-08-08: qty 50

## 2012-08-08 MED ORDER — KETAMINE HCL 10 MG/ML IJ SOLN
INTRAMUSCULAR | Status: DC | PRN
  Administered 2012-08-08 (×2): 2 mg via INTRAVENOUS
  Administered 2012-08-08: 30 mg via INTRAVENOUS
  Administered 2012-08-08 (×3): 2 mg via INTRAVENOUS
  Administered 2012-08-08: 1 mg via INTRAVENOUS
  Administered 2012-08-08: 2 mg via INTRAVENOUS
  Administered 2012-08-08: 1 mg via INTRAVENOUS

## 2012-08-08 MED ORDER — DEXAMETHASONE SODIUM PHOSPHATE 10 MG/ML IJ SOLN
10.0000 mg | Freq: Once | INTRAMUSCULAR | Status: DC
  Filled 2012-08-08: qty 1

## 2012-08-08 MED ORDER — CELECOXIB 200 MG PO CAPS
200.0000 mg | ORAL_CAPSULE | Freq: Two times a day (BID) | ORAL | Status: DC
  Administered 2012-08-08 – 2012-08-10 (×4): 200 mg via ORAL
  Filled 2012-08-08 (×5): qty 1

## 2012-08-08 MED ORDER — EPHEDRINE SULFATE 50 MG/ML IJ SOLN
INTRAMUSCULAR | Status: AC
  Filled 2012-08-08: qty 1

## 2012-08-08 MED ORDER — DIPHENHYDRAMINE HCL 25 MG PO CAPS: 25.0000 mg | ORAL_CAPSULE | Freq: Four times a day (QID) | ORAL | Status: DC | PRN

## 2012-08-08 MED ORDER — ONDANSETRON HCL 4 MG/2ML IJ SOLN: 4.0000 mg | Freq: Four times a day (QID) | INTRAMUSCULAR | Status: DC | PRN

## 2012-08-08 MED ORDER — LACTATED RINGERS IV SOLN
INTRAVENOUS | Status: DC
  Administered 2012-08-08: 1000 mL via INTRAVENOUS

## 2012-08-08 MED ORDER — HYDROMORPHONE HCL PF 1 MG/ML IJ SOLN
0.2500 mg | INTRAMUSCULAR | Status: DC | PRN
Start: 2012-08-08 — End: 2012-08-08
  Administered 2012-08-08: 0.5 mg via INTRAVENOUS

## 2012-08-08 MED ORDER — DOCUSATE SODIUM 100 MG PO CAPS
100.0000 mg | ORAL_CAPSULE | Freq: Two times a day (BID) | ORAL | Status: DC
  Administered 2012-08-08 – 2012-08-10 (×4): 100 mg via ORAL

## 2012-08-08 MED ORDER — 0.9 % SODIUM CHLORIDE (POUR BTL) OPTIME
TOPICAL | Status: DC | PRN
  Administered 2012-08-08: 1000 mL

## 2012-08-08 MED ORDER — HYDROCODONE-ACETAMINOPHEN 7.5-325 MG PO TABS
1.0000 | ORAL_TABLET | ORAL | Status: DC
  Administered 2012-08-08 – 2012-08-10 (×4): 1 via ORAL
  Filled 2012-08-08: qty 1
  Filled 2012-08-08: qty 2
  Filled 2012-08-08 (×3): qty 1

## 2012-08-08 MED ORDER — INFLUENZA VIRUS VACC SPLIT PF IM SUSP: 0.5000 mL | INTRAMUSCULAR | Status: DC | PRN

## 2012-08-08 MED ORDER — LACTATED RINGERS IV BOLUS (SEPSIS): 500.0000 mL | Freq: Once | INTRAVENOUS | Status: DC

## 2012-08-08 MED ORDER — PROPOFOL 10 MG/ML IV BOLUS
INTRAVENOUS | Status: DC | PRN
  Administered 2012-08-08: 200 mg via INTRAVENOUS

## 2012-08-08 MED ORDER — PHENYLEPHRINE HCL 10 MG/ML IJ SOLN
INTRAMUSCULAR | Status: DC | PRN
  Administered 2012-08-08 (×2): 40 ug via INTRAVENOUS
  Administered 2012-08-08 (×3): 80 ug via INTRAVENOUS

## 2012-08-08 MED ORDER — NEOSTIGMINE METHYLSULFATE 1 MG/ML IJ SOLN
INTRAMUSCULAR | Status: DC | PRN
  Administered 2012-08-08: 5 mg via INTRAVENOUS

## 2012-08-08 MED ORDER — DEXAMETHASONE SODIUM PHOSPHATE 10 MG/ML IJ SOLN
10.0000 mg | Freq: Once | INTRAMUSCULAR | Status: AC
  Administered 2012-08-09: 10 mg via INTRAVENOUS
  Filled 2012-08-08: qty 1

## 2012-08-08 MED ORDER — ROCURONIUM BROMIDE 100 MG/10ML IV SOLN
INTRAVENOUS | Status: DC | PRN
  Administered 2012-08-08: 50 mg via INTRAVENOUS

## 2012-08-08 MED ORDER — ZOLPIDEM TARTRATE 5 MG PO TABS: 5.0000 mg | ORAL_TABLET | Freq: Every evening | ORAL | Status: DC | PRN

## 2012-08-08 MED ORDER — ONDANSETRON HCL 4 MG PO TABS: 4.0000 mg | ORAL_TABLET | Freq: Four times a day (QID) | ORAL | Status: DC | PRN

## 2012-08-08 MED ORDER — GLYCOPYRROLATE 0.2 MG/ML IJ SOLN
INTRAMUSCULAR | Status: DC | PRN
  Administered 2012-08-08: 0.6 mg via INTRAVENOUS

## 2012-08-08 MED ORDER — POLYETHYLENE GLYCOL 3350 17 G PO PACK
17.0000 g | PACK | Freq: Two times a day (BID) | ORAL | Status: DC
  Administered 2012-08-08 – 2012-08-10 (×4): 17 g via ORAL

## 2012-08-08 MED ORDER — PROMETHAZINE HCL 25 MG/ML IJ SOLN
INTRAMUSCULAR | Status: AC
  Filled 2012-08-08: qty 1

## 2012-08-08 MED ORDER — HYDROMORPHONE HCL PF 1 MG/ML IJ SOLN
INTRAMUSCULAR | Status: DC | PRN
  Administered 2012-08-08 (×2): 1 mg via INTRAVENOUS
  Administered 2012-08-08 (×2): 0.5 mg via INTRAVENOUS

## 2012-08-08 MED ORDER — FENTANYL CITRATE 0.05 MG/ML IJ SOLN
INTRAMUSCULAR | Status: DC | PRN
  Administered 2012-08-08: 50 ug via INTRAVENOUS
  Administered 2012-08-08: 100 ug via INTRAVENOUS
  Administered 2012-08-08 (×3): 50 ug via INTRAVENOUS

## 2012-08-08 MED ORDER — AMLODIPINE BESYLATE 5 MG PO TABS
5.0000 mg | ORAL_TABLET | Freq: Every morning | ORAL | Status: DC
Start: 2012-08-09 — End: 2012-08-10
  Administered 2012-08-09 – 2012-08-10 (×2): 5 mg via ORAL
  Filled 2012-08-08 (×2): qty 1

## 2012-08-08 MED ORDER — ONDANSETRON HCL 4 MG/2ML IJ SOLN
INTRAMUSCULAR | Status: DC | PRN
  Administered 2012-08-08: 4 mg via INTRAVENOUS

## 2012-08-08 MED ORDER — RIVAROXABAN 10 MG PO TABS
10.0000 mg | ORAL_TABLET | ORAL | Status: DC
  Administered 2012-08-09 – 2012-08-10 (×2): 10 mg via ORAL
  Filled 2012-08-08 (×3): qty 1

## 2012-08-08 MED ORDER — FLEET ENEMA 7-19 GM/118ML RE ENEM: 1.0000 | ENEMA | Freq: Once | RECTAL | Status: AC | PRN

## 2012-08-08 MED ORDER — FERROUS SULFATE 325 (65 FE) MG PO TABS
325.0000 mg | ORAL_TABLET | Freq: Three times a day (TID) | ORAL | Status: DC
  Administered 2012-08-08 – 2012-08-10 (×6): 325 mg via ORAL
  Filled 2012-08-08 (×8): qty 1

## 2012-08-08 MED ORDER — CLINDAMYCIN PHOSPHATE 600 MG/50ML IV SOLN
600.0000 mg | Freq: Four times a day (QID) | INTRAVENOUS | Status: AC
  Administered 2012-08-08 – 2012-08-09 (×2): 600 mg via INTRAVENOUS
  Filled 2012-08-08 (×2): qty 50

## 2012-08-08 MED ORDER — METOCLOPRAMIDE HCL 10 MG PO TABS: 5.0000 mg | ORAL_TABLET | Freq: Three times a day (TID) | ORAL | Status: DC | PRN

## 2012-08-08 MED ORDER — LACTATED RINGERS IV BOLUS (SEPSIS)
500.0000 mL | Freq: Once | INTRAVENOUS | Status: AC
  Administered 2012-08-08: 500 mL via INTRAVENOUS

## 2012-08-08 MED ORDER — METHOCARBAMOL 500 MG PO TABS
500.0000 mg | ORAL_TABLET | Freq: Four times a day (QID) | ORAL | Status: DC | PRN
  Administered 2012-08-09 (×2): 500 mg via ORAL
  Filled 2012-08-08 (×2): qty 1

## 2012-08-08 MED ORDER — CHLORHEXIDINE GLUCONATE 4 % EX LIQD
60.0000 mL | Freq: Once | CUTANEOUS | Status: DC
  Filled 2012-08-08: qty 60

## 2012-08-08 MED ORDER — METOCLOPRAMIDE HCL 5 MG/ML IJ SOLN
INTRAMUSCULAR | Status: DC | PRN
  Administered 2012-08-08: 10 mg via INTRAVENOUS

## 2012-08-08 MED ORDER — SODIUM CHLORIDE 0.9 % IV SOLN
100.0000 mL/h | INTRAVENOUS | Status: DC
  Administered 2012-08-08 – 2012-08-09 (×2): 100 mL/h via INTRAVENOUS
  Filled 2012-08-08 (×11): qty 1000

## 2012-08-08 MED ORDER — LACTATED RINGERS IV SOLN
INTRAVENOUS | Status: DC | PRN
  Administered 2012-08-08 (×3): via INTRAVENOUS

## 2012-08-08 MED ORDER — PROMETHAZINE HCL 25 MG/ML IJ SOLN
6.2500 mg | INTRAMUSCULAR | Status: DC | PRN
  Administered 2012-08-08: 6.25 mg via INTRAVENOUS

## 2012-08-08 SURGICAL SUPPLY — 42 items
BAG ZIPLOCK 12X15 (MISCELLANEOUS) ×4 IMPLANT
BLADE SAW SGTL 18X1.27X75 (BLADE) ×2 IMPLANT
CELLS DAT CNTRL 66122 CELL SVR (MISCELLANEOUS) ×1 IMPLANT
CLOTH BEACON ORANGE TIMEOUT ST (SAFETY) ×2 IMPLANT
DERMABOND ADVANCED (GAUZE/BANDAGES/DRESSINGS) ×1
DERMABOND ADVANCED .7 DNX12 (GAUZE/BANDAGES/DRESSINGS) ×1 IMPLANT
DRAPE C-ARM 42X72 X-RAY (DRAPES) ×2 IMPLANT
DRAPE STERI IOBAN 125X83 (DRAPES) ×2 IMPLANT
DRAPE U-SHAPE 47X51 STRL (DRAPES) ×6 IMPLANT
DRSG AQUACEL AG ADV 3.5X10 (GAUZE/BANDAGES/DRESSINGS) ×2 IMPLANT
DRSG TEGADERM 4X4.75 (GAUZE/BANDAGES/DRESSINGS) ×2 IMPLANT
DURAPREP 26ML APPLICATOR (WOUND CARE) ×2 IMPLANT
ELECT BLADE TIP CTD 4 INCH (ELECTRODE) ×2 IMPLANT
ELECT REM PT RETURN 9FT ADLT (ELECTROSURGICAL) ×2
ELECTRODE REM PT RTRN 9FT ADLT (ELECTROSURGICAL) ×1 IMPLANT
EVACUATOR 1/8 PVC DRAIN (DRAIN) ×2 IMPLANT
FACESHIELD LNG OPTICON STERILE (SAFETY) ×8 IMPLANT
GAUZE SPONGE 2X2 8PLY STRL LF (GAUZE/BANDAGES/DRESSINGS) ×1 IMPLANT
GLOVE BIOGEL PI IND STRL 7.5 (GLOVE) ×1 IMPLANT
GLOVE BIOGEL PI IND STRL 8 (GLOVE) ×1 IMPLANT
GLOVE BIOGEL PI INDICATOR 7.5 (GLOVE) ×1
GLOVE BIOGEL PI INDICATOR 8 (GLOVE) ×1
GLOVE ECLIPSE 8.0 STRL XLNG CF (GLOVE) ×2 IMPLANT
GLOVE INDICATOR 6.5 STRL GRN (GLOVE) ×2 IMPLANT
GLOVE ORTHO TXT STRL SZ7.5 (GLOVE) ×4 IMPLANT
GLOVE SURG SS PI 6.5 STRL IVOR (GLOVE) ×2 IMPLANT
GOWN BRE IMP PREV XXLGXLNG (GOWN DISPOSABLE) ×2 IMPLANT
GOWN BRE IMP SLV AUR XL STRL (GOWN DISPOSABLE) ×4 IMPLANT
GOWN STRL NON-REIN LRG LVL3 (GOWN DISPOSABLE) ×2 IMPLANT
KIT BASIN OR (CUSTOM PROCEDURE TRAY) ×2 IMPLANT
PACK TOTAL JOINT (CUSTOM PROCEDURE TRAY) ×2 IMPLANT
PADDING CAST COTTON 6X4 STRL (CAST SUPPLIES) ×2 IMPLANT
RTRCTR WOUND ALEXIS 18CM MED (MISCELLANEOUS) ×2
SPONGE GAUZE 2X2 STER 10/PKG (GAUZE/BANDAGES/DRESSINGS) ×1
SUCTION FRAZIER 12FR DISP (SUCTIONS) ×2 IMPLANT
SUT MNCRL AB 4-0 PS2 18 (SUTURE) ×2 IMPLANT
SUT VIC AB 1 CT1 36 (SUTURE) ×8 IMPLANT
SUT VIC AB 2-0 CT1 27 (SUTURE) ×2
SUT VIC AB 2-0 CT1 TAPERPNT 27 (SUTURE) ×2 IMPLANT
SUT VLOC 180 0 24IN GS25 (SUTURE) ×2 IMPLANT
TOWEL OR 17X26 10 PK STRL BLUE (TOWEL DISPOSABLE) ×4 IMPLANT
TRAY FOLEY CATH 14FRSI W/METER (CATHETERS) ×2 IMPLANT

## 2012-08-08 NOTE — Progress Notes (Signed)
PACU NOTE:  One black bag and cane received from surgical  Waiting area and placed at bedside.

## 2012-08-08 NOTE — Interval H&P Note (Signed)
History and Physical Interval Note:  08/08/2012 10:38 AM  Sheila Hartman  has presented today for surgery, with the diagnosis of Right Hip Osteoarthritis  The various methods of treatment have been discussed with the patient and family. After consideration of risks, benefits and other options for treatment, the patient has consented to  Procedure(s) (LRB) with comments: TOTAL HIP ARTHROPLASTY ANTERIOR APPROACH (Right) as a surgical intervention .  The patient's history has been reviewed, patient examined, no change in status, stable for surgery.  I have reviewed the patient's chart and labs.  Questions were answered to the patient's satisfaction.     Shelda Pal

## 2012-08-08 NOTE — Progress Notes (Signed)
PACU note; pt's Blood pressure 90's/60's; Dr. Rica Mast notified, made aware of blood loss with surgery, nausea and med given; orders rec'd; H & H drawn and IV fluid bolus given

## 2012-08-08 NOTE — Transfer of Care (Signed)
Immediate Anesthesia Transfer of Care Note  Patient: Sheila Hartman  Procedure(s) Performed: Procedure(s) (LRB) with comments: TOTAL HIP ARTHROPLASTY ANTERIOR APPROACH (Right)  Patient Location: PACU  Anesthesia Type:General  Level of Consciousness: sedated and patient cooperative  Airway & Oxygen Therapy: Patient Spontanous Breathing and Patient connected to face mask oxygen  Post-op Assessment: Report given to PACU RN, Post -op Vital signs reviewed and stable and Patient moving all extremities  Post vital signs: Reviewed and stable  Complications: No apparent anesthesia complications

## 2012-08-08 NOTE — Op Note (Signed)
NAME:  ALEECE LOYD                ACCOUNT NO.: [id number]      MEDICAL RECORD NO.: [id number]      FACILITY:  [facility]      PHYSICIAN:  Durene Romans D  DATE OF BIRTH:  December 13, 1950     DATE OF PROCEDURE:  08/08/2012                                 OPERATIVE REPORT         PREOPERATIVE DIAGNOSIS: Right  hip osteoarthritis.      POSTOPERATIVE DIAGNOSIS:  Right hip osteoarthritis.      PROCEDURE:  Right total hip replacement through an anterior approach   utilizing DePuy THR system, component size 52mm pinnacle cup, a size 36+4 neutral   Altrex liner, a size 4 Hi Tri Lock stem with a 36+1.5 delta ceramic   ball.      SURGEON:  Madlyn Frankel. Charlann Boxer, M.D.      ASSISTANT:  Lanney Gins, PA      ANESTHESIA:  General.      SPECIMENS:  None.      COMPLICATIONS:  None.      BLOOD LOSS:  700 cc     DRAINS:  One Hemovac.      INDICATION OF THE PROCEDURE:  CARESSA SCEARCE is a 61 y.o. female who had   presented to office for evaluation of right hip pain.  Radiographs revealed   progressive degenerative changes with bone-on-bone   articulation to the  hip joint.  The patient had painful limited range of   motion significantly affecting their overall quality of life.  The patient was failing to    respond to conservative measures, and at this point was ready   to proceed with more definitive measures.  The patient has noted progressive   degenerative changes in his hip, progressive problems and dysfunction   with regarding the hip prior to surgery.  Consent was obtained for   benefit of pain relief.  Specific risk of infection, DVT, component   failure, dislocation, need for revision surgery, as well discussion of   the anterior versus posterior approach were reviewed.  Consent was   obtained for benefit of anterior pain relief through an anterior   approach.      PROCEDURE IN DETAIL:  The patient was brought to operative theater.   Once adequate  anesthesia, preoperative antibiotics, 900mg  of Clindamycin administered.   The patient was positioned supine on the OSI Hanna table.  Once adequate   padding of boney process was carried out, we had predraped out the hip, and  used fluoroscopy to confirm orientation of the pelvis and position.      The right hip was then prepped and draped from proximal iliac crest to   mid thigh with shower curtain technique.      Time-out was performed identifying the patient, planned procedure, and   extremity.     An incision was then made 2 cm distal and lateral to the   anterior superior iliac spine extending over the orientation of the   tensor fascia lata muscle and sharp dissection was carried down to the   fascia of the muscle and protractor placed in the soft tissues.      The fascia was then incised.  The muscle belly was identified  and swept   laterally and retractor placed along the superior neck.  Following   cauterization of the circumflex vessels and removing some pericapsular   fat, a second cobra retractor was placed on the inferior neck.  A third   retractor was placed on the anterior acetabulum after elevating the   anterior rectus.  A L-capsulotomy was along the line of the   superior neck to the trochanteric fossa, then extended proximally and   distally.  Tag sutures were placed and the retractors were then placed   intracapsular.  We then identified the trochanteric fossa and   orientation of my neck cut, confirmed this radiographically   and then made a neck osteotomy with the femur on traction.  The femoral   head was removed without difficulty or complication.  Traction was let   off and retractors were placed posterior and anterior around the   acetabulum.      The labrum and foveal tissue were debrided.  I began reaming with a 45mm   reamer and reamed up to 51mm reamer with good bony bed preparation and a 52   cup was chosen.  The final 52mm Pinnacle cup was then  impacted under fluoroscopy  to confirm the depth of penetration and orientation with respect to   abduction.  A screw was placed followed by the hole eliminator.  Osteophytes were removed from the anterior and posterior aspect of the acetabulum.  The final   36+4 neutral Altrex liner was impacted with good visualized rim fit.  The cup was positioned anatomically within the acetabular portion of the pelvis.      At this point, the femur was rolled at 80 degrees.  Further capsule was   released off the inferior aspect of the femoral neck.  I then   released the superior capsule proximally.  The hook was placed laterally   along the femur and elevated manually and held in position with the bed   hook.  The leg was then extended and adducted with the leg rolled to 100   degrees of external rotation.  Once the proximal femur was fully   exposed, I used a box osteotome to set orientation.  I then began   broaching with the starting chili pepper broach and passed this by hand and then broached up to 4.  With the 4 broach in place I chose a high offset neck and did a trial reduction with the 36+1.5 ball.  The offset was appropriate, leg lengths   appeared to be equal, confirmed radiographically.   Given these findings, I went ahead and dislocated the hip, repositioned all   retractors and positioned the right hip in the extended and abducted position.  The final 4 Hi Tri Lock stem was   chosen and it was impacted down to the level of neck cut.  Based on this   and the trial reduction, a 36+1.5 delta ceramic ball was chosen and   impacted onto a clean and dry trunnion, and the hip was reduced.  The   hip had been irrigated throughout the case again at this point.  I did   reapproximate the superior capsular leaflet to the anterior leaflet   using #1 Vicryl, placed a medium Hemovac drain deep.  The fascia of the   tensor fascia lata muscle was then reapproximated using #1 Vicryl.  The   remaining wound  was closed with 2-0 Vicryl and running 4-0 Monocryl.   The hip  was cleaned, dried, and dressed sterilely using Dermabond and   Aquacel dressing.  Drain site dressed separately.  She was then brought   to recovery room in stable condition tolerating the procedure well.    Lanney Gins, PA-C was present for the entirety of the case involved from   preoperative positioning, perioperative retractor management, general   facilitation of the case, as well as primary wound closure as assistant.            Madlyn Frankel Charlann Boxer, M.D.

## 2012-08-08 NOTE — Anesthesia Preprocedure Evaluation (Signed)
Anesthesia Evaluation    Airway Mallampati: II TM Distance: >3 FB Neck ROM: Full    Dental  (+) Teeth Intact and Caps   Pulmonary  breath sounds clear to auscultation  Pulmonary exam normal       Cardiovascular hypertension, Pt. on medications Rhythm:Regular Rate:Normal     Neuro/Psych Anxiety TIA (No residual)   GI/Hepatic GERD-  ,  Endo/Other  Morbid obesity  Renal/GU      Musculoskeletal   Abdominal   Peds  Hematology   Anesthesia Other Findings   Reproductive/Obstetrics                           Anesthesia Physical Anesthesia Plan  ASA: III  Anesthesia Plan: General   Post-op Pain Management:    Induction: Intravenous  Airway Management Planned: Oral ETT  Additional Equipment:   Intra-op Plan:   Post-operative Plan: Extubation in OR  Informed Consent: I have reviewed the patients History and Physical, chart, labs and discussed the procedure including the risks, benefits and alternatives for the proposed anesthesia with the patient or authorized representative who has indicated his/her understanding and acceptance.   Dental advisory given  Plan Discussed with: CRNA  Anesthesia Plan Comments:         Anesthesia Quick Evaluation

## 2012-08-08 NOTE — Anesthesia Postprocedure Evaluation (Signed)
Anesthesia Post Note  Patient: Sheila Hartman  Procedure(s) Performed: Procedure(s) (LRB): TOTAL HIP ARTHROPLASTY ANTERIOR APPROACH (Right)  Anesthesia type: General  Patient location: PACU  Post pain: Pain level controlled  Post assessment: Post-op Vital signs reviewed  Last Vitals:  Filed Vitals:   08/08/12 2029  BP:   Pulse: 84  Temp:   Resp: 14    Post vital signs: Reviewed  Level of consciousness: sedated  Complications: No apparent anesthesia complications

## 2012-08-08 NOTE — Progress Notes (Signed)
PACU note; lab results called to Dr. Rica Mast; new orders rec'd

## 2012-08-08 NOTE — Preoperative (Signed)
Beta Blockers   Reason not to administer Beta Blockers:Not Applicable, not on home BB 

## 2012-08-09 ENCOUNTER — Encounter (HOSPITAL_COMMUNITY): Payer: Self-pay | Admitting: Orthopedic Surgery

## 2012-08-09 DIAGNOSIS — D500 Iron deficiency anemia secondary to blood loss (chronic): Secondary | ICD-10-CM

## 2012-08-09 LAB — BASIC METABOLIC PANEL
Chloride: 107 mEq/L (ref 96–112)
Creatinine, Ser: 0.73 mg/dL (ref 0.50–1.10)
GFR calc Af Amer: >90 mL/min (ref >90)
Potassium: 3.8 mEq/L (ref 3.5–5.1)
Sodium: 140 mEq/L (ref 135–145)

## 2012-08-09 LAB — CBC
Platelets: 233 10*3/uL (ref 150–400)
RDW: 15.1 % (ref 11.5–15.5)
WBC: 13.6 10*3/uL — ABNORMAL HIGH (ref 4.0–10.5)

## 2012-08-09 NOTE — Progress Notes (Signed)
Clinical Social Work Department BRIEF PSYCHOSOCIAL ASSESSMENT 08/09/2012  Patient:  ALAUNA, HAYDEN     Account Number:  [id number]     Admit date:  08/08/2012  Clinical Social Worker:  Candie Chroman  Date/Time:  08/09/2012 10:53 AM  Referred by:  RN  Date Referred:  08/09/2012 Referred for  SNF Placement   Other Referral:   Interview type:  Patient Other interview type:    PSYCHOSOCIAL DATA Living Status:  FAMILY Admitted from facility:   Level of care:   Primary support name:  Reneta Barris Primary support relationship to patient:  FAMILY Degree of support available:   supportive    CURRENT CONCERNS Current Concerns  Post-Acute Placement   Other Concerns:    SOCIAL WORK ASSESSMENT / PLAN Pt is a 61 yr old female living at home with her family prior to hospitalization.CSW met with pt to assist with d/c planning. Pt plans to return home following hospital d/c. D/C needs discussed with PA/PT. D/C home with [facility] appears appropriate at this time. PT will work with pt on stairs prior to d/c home.   Assessment/plan status:  Psychosocial Support/Ongoing Assessment of Needs Other assessment/ plan:   HH vs ST SNF   Information/referral to community resources:   None needed at this time.    PATIENT'S/FAMILY'S RESPONSE TO PLAN OF CARE: Pt is plans to return home with [facility]. Pt will request CSW assistance with [facility] if she feels , after continued treatment with PT, SNF is needed.    Cori Razor LCSW [phone]

## 2012-08-09 NOTE — Progress Notes (Signed)
   08/09/12 1200  PT Visit Information  Last PT Received On 08/09/12  Assistance Needed +1  PT Time Calculation  PT Start Time 1209  PT Stop Time 1235  PT Time Calculation (min) 26 min  Subjective Data  Subjective I feel better  Patient Stated Goal home  Precautions  Precautions None  Restrictions  RLE Weight Bearing WBAT  Cognition  Overall Cognitive Status Appears within functional limits for tasks assessed/performed  Arousal/Alertness Awake/alert  Orientation Level Appears intact for tasks assessed  Behavior During Session [facility] for tasks performed  Transfers  Transfers Sit to Stand;Stand to Sit  Sit to Stand 4: Min guard;5: Supervision  Stand to Sit 4: Min guard;5: Supervision  Details for Transfer Assistance cues for hand placement,and wt shift  Ambulation/Gait  Ambulation/Gait Assistance 4: Min guard  Ambulation Distance (Feet) 95 Feet  Assistive device Rolling walker  Ambulation/Gait Assistance Details less dizziness; cues for sequence  Gait Pattern Step-to pattern;Trunk flexed  Exercises  Exercises Total Joint  Total Joint Exercises  Ankle Circles/Pumps AROM;Both;10 reps  Quad Sets AROM;10 reps;Both  Heel Slides AAROM;Right;10 reps  Hip ABduction/ADduction Right;10 reps;AAROM  Long Arc Quad AROM;20 reps;Right  PT - End of Session  Activity Tolerance Patient tolerated treatment well  Patient left in chair;with call bell/phone within reach;with family/visitor present  PT - Assessment/Plan  Comments on Treatment Session progressing nicely, stairs in am  PT Plan Discharge plan remains appropriate;Frequency remains appropriate  PT Frequency 7X/week  Follow Up Recommendations Home health PT  Equipment Recommended Rolling walker with 5" wheels  Acute Rehab PT Goals  Time For Goal Achievement 08/14/12  Potential to Achieve Goals Good  Pt will go Sit to Stand with modified independence  PT Goal: Sit to Stand - Progress Progressing toward goal  Pt will go Stand to Sit  with modified independence  PT Goal: Stand to Sit - Progress Progressing toward goal  Pt will Ambulate 51 - 150 feet;with supervision;with rolling walker  PT Goal: Ambulate - Progress Progressing toward goal  PT General Charges  $$ ACUTE PT VISIT 1 Procedure  PT Treatments  $Gait Training 23-37 mins

## 2012-08-09 NOTE — Evaluation (Signed)
Physical Therapy Evaluation Patient Details Name: Sheila Hartman MRN: [id number] DOB: Dec 15, 1950 Today's Date: 08/09/2012 Time: 0952-1020 PT Time Calculation (min): 28 min  PT Assessment / Plan / Recommendation Clinical Impression  pt is s/p AA R THA POD # 1 today and will benefit from PT to maximize independence for return home.    PT Assessment  Patient needs continued PT services    Follow Up Recommendations  Home health PT    Does the patient have the potential to tolerate intense rehabilitation      Barriers to Discharge   15 steps to enter apt    Equipment Recommendations  Rolling walker with 5" wheels    Recommendations for Other Services     Frequency 7X/week    Precautions / Restrictions Restrictions Weight Bearing Restrictions: No RLE Weight Bearing: Weight bearing as tolerated   Pertinent Vitals/Pain       Mobility  Bed Mobility Bed Mobility: Supine to Sit Supine to Sit: 5: Supervision Details for Bed Mobility Assistance: increased time; pt self assists with LLE Transfers Transfers: Sit to Stand;Stand to Sit Sit to Stand: 4: Min guard;From bed;With upper extremity assist Stand to Sit: 4: Min guard;To chair/3-in-1;With upper extremity assist Details for Transfer Assistance: cues for hand placement,and wt shift Ambulation/Gait Ambulation/Gait Assistance: 4: Min guard Ambulation Distance (Feet): 30 Feet Assistive device: Rolling walker Ambulation/Gait Assistance Details: chair follow due to mild dizziness; cues for sequence and and RW distance from self Gait Pattern: Step-to pattern;Trunk flexed    Shoulder Instructions     Exercises Total Joint Exercises Ankle Circles/Pumps: AROM;Both;10 reps Quad Sets: AROM;10 reps;Both   PT Diagnosis: Difficulty walking  PT Problem List: Decreased strength;Decreased range of motion;Decreased activity tolerance;Decreased balance;Decreased mobility;Decreased knowledge of use of DME PT Treatment Interventions:  Gait training;Stair training;Functional mobility training;Therapeutic activities;Therapeutic exercise;Balance training;Patient/family education;DME instruction   PT Goals Acute Rehab PT Goals PT Goal Formulation: With patient Time For Goal Achievement: 08/14/12 Potential to Achieve Goals: Good Pt will go Supine/Side to Sit: with modified independence PT Goal: Supine/Side to Sit - Progress: Goal set today Pt will go Sit to Supine/Side: with modified independence PT Goal: Sit to Supine/Side - Progress: Goal set today Pt will go Sit to Stand: with modified independence PT Goal: Sit to Stand - Progress: Goal set today Pt will go Stand to Sit: with modified independence PT Goal: Stand to Sit - Progress: Goal set today Pt will Ambulate: 51 - 150 feet;with supervision;with rolling walker PT Goal: Ambulate - Progress: Goal set today Pt will Go Up / Down Stairs: Flight;with min assist;with least restrictive assistive device;with rail(s) PT Goal: Up/Down Stairs - Progress: Goal set today  Visit Information  Last PT Received On: 08/09/12 Assistance Needed: +2 (safety due to dizziness)    Subjective Data  Subjective: It is this hip Patient Stated Goal: home   Prior Functioning  Home Living Lives With: Other (Comment) Available Help at Discharge: Family Type of Home: Apartment Home Access: Stairs to enter Secretary/administrator of Steps: 15 Entrance Stairs-Rails: Right Home Layout: One level Home Adaptive Equipment: Straight cane Prior Function Level of Independence: Independent Able to Take Stairs?: Yes Communication Communication: No difficulties    Cognition  Overall Cognitive Status: Appears within functional limits for tasks assessed/performed Arousal/Alertness: Awake/alert Orientation Level: Appears intact for tasks assessed Behavior During Session: [facility] for tasks performed    Extremity/Trunk Assessment Right Upper Extremity Assessment RUE ROM/Strength/Tone: [facility] for tasks  assessed Left Upper Extremity Assessment LUE ROM/Strength/Tone: [facility] for  tasks assessed Right Lower Extremity Assessment RLE ROM/Strength/Tone: Deficits RLE ROM/Strength/Tone Deficits: hip grossly 3/5, ankle WFL Left Lower Extremity Assessment LLE ROM/Strength/Tone: WFL for tasks assessed   Balance    End of Session PT - End of Session Activity Tolerance: Patient tolerated treatment well Patient left: in chair;with call bell/phone within reach;with family/visitor present Nurse Communication: Mobility status  GP     [facility] 08/09/2012, 10:34 AM

## 2012-08-09 NOTE — Evaluation (Addendum)
Occupational Therapy Evaluation Patient Details Name: CECILIA Hartman MRN: [id number] DOB: 1951/03/08 Today's Date: 08/09/2012 Time: 4098-1191 OT Time Calculation (min): 15 min  OT Assessment / Plan / Recommendation Clinical Impression  This 61 year old female was admitted for R anterior direct THA.  All education was completed.  Pt does not need further OT.  HHPT can assess her tub and recommend DME if pt is unable to step inside in a couple of days.      OT Assessment  Patient does not need any further OT services    Follow Up Recommendations  No OT follow up    Barriers to Discharge      Equipment Recommendations  Other (comment)  Pt wants an elevated toilet seat--(needs one that accommodates her weight 297 lbs).  Will not fit into one with arm rests--prefer this over 3:1. HHPT can assess for tub dme if she cannot step over tub in a couple of days   Recommendations for Other Services    Frequency       Precautions / Restrictions Precautions Precautions: None Restrictions RLE Weight Bearing: Weight bearing as tolerated   Pertinent Vitals/Pain No pain     ADL  Grooming: Simulated;Min guard Where Assessed - Grooming: Supported standing Toilet Transfer: Performed;Minimal Dentist Method: Sit to Barista: Comfort height toilet;Grab bars Toileting - Architect and Hygiene: Simulated;Min guard Where Assessed - Engineer, mining and Hygiene: Sit to stand from 3-in-1 or toilet Equipment Used: Rolling walker Transfers/Ambulation Related to ADLs: ambulated to bathroom with min guard A.  She is not ready to step into tub at this time:reviewed method.  cues for turning 90 degrees instead of 270 and cues for UE/LE management ADL Comments: Pt's family member is giving her a reacher:  hers is broken.  Pt has used this for adls before.  She does not have precautions to follow but cannot reach to don pants yet.      OT  Diagnosis:    OT Problem List:   OT Treatment Interventions:     OT Goals    Visit Information  Last OT Received On: 08/09/12 Assistance Needed: +1    Subjective Data  Subjective: My toilet is a little higher than a normal toilet Patient Stated Goal: home   Prior Functioning     Home Living Bathroom Shower/Tub: Engineer, manufacturing systems:  (comfort height commode) Communication Communication: No difficulties         Vision/Perception     Cognition  Overall Cognitive Status: Appears within functional limits for tasks assessed/performed Arousal/Alertness: Awake/alert Orientation Level: Appears intact for tasks assessed Behavior During Session: [facility] for tasks performed    Extremity/Trunk Assessment Right Upper Extremity Assessment RUE ROM/Strength/Tone: [facility] for tasks assessed Left Upper Extremity Assessment LUE ROM/Strength/Tone: WFL for tasks assessed     Mobility Transfers Sit to Stand: 5: Supervision Stand to Sit: 4: Min guard;5: Supervision Details for Transfer Assistance: cues for hand placement,and wt shift     Shoulder Instructions     Exercise    Balance     End of Session OT - End of Session Activity Tolerance: Patient tolerated treatment well Patient left: in chair;with call bell/phone within reach;with family/visitor present  GO     Sheila Hartman 08/09/2012, 2:36 PM Marica Otter, OTR/L [phone] 08/09/2012

## 2012-08-09 NOTE — Progress Notes (Signed)
   Subjective: 1 Day Post-Op Procedure(s) (LRB): TOTAL HIP ARTHROPLASTY ANTERIOR APPROACH (Right)   Patient reports pain as mild, pain well controlled. Feels that she is doing really well. No events throughout the night.   Objective:   VITALS:   Filed Vitals:   08/09/12 1000  BP: 121/76  Pulse: 99  Temp: 98.4 F (36.9 C)   Resp: 16    Neurovascular intact Dorsiflexion/Plantar flexion intact Incision: dressing C/D/I No cellulitis present Compartment soft  LABS  Basename 08/09/12 0440 08/08/12 1618  HGB 8.1* 9.1*  HCT 26.0* 28.3*  WBC 13.6* --  PLT 233 --     Basename 08/09/12 0440  NA 140  K 3.8  BUN 11  CREATININE 0.73  GLUCOSE 129*     Assessment/Plan: 1 Day Post-Op Procedure(s) (LRB): TOTAL HIP ARTHROPLASTY ANTERIOR APPROACH (Right) Foley cath d/c'ed HV drain d/c'ed Advance diet Up with therapy D/C IV fluids Plan for discharge tomorrow to home, if she continues to do well.  Expected ABLA  Treated with iron and will observe  Morbid Obesity (BMI >40)  Estimated Body mass index is 47.94 kg/(m^2) as calculated from the following:   Height as of this encounter: 5\' 6" (1.676 m).   Weight as of this encounter: 297 lb(134.718 kg). Patient also counseled that weight may inhibit the healing process Patient counseled that losing weight will help with future health issues       Sheila Hartman. Sheila Hartman   PAC  08/09/2012, 11:06 AM

## 2012-08-09 NOTE — Progress Notes (Signed)
Utilization review completed.  

## 2012-08-10 LAB — BASIC METABOLIC PANEL
BUN: 17 mg/dL (ref 6–23)
Chloride: 106 mEq/L (ref 96–112)
GFR calc Af Amer: >90 mL/min (ref >90)
Potassium: 4 mEq/L (ref 3.5–5.1)

## 2012-08-10 LAB — CBC
HCT: 23.9 % — ABNORMAL LOW (ref 36.0–46.0)
Hemoglobin: 7.6 g/dL — ABNORMAL LOW (ref 12.0–15.0)
RBC: 3.02 MIL/uL — ABNORMAL LOW (ref 3.87–5.11)
RDW: 15.4 % (ref 11.5–15.5)
WBC: 15.5 10*3/uL — ABNORMAL HIGH (ref 4.0–10.5)

## 2012-08-10 MED ORDER — ASPIRIN 325 MG PO TABS: 325.0000 mg | ORAL_TABLET | Freq: Two times a day (BID) | ORAL | Status: DC

## 2012-08-10 MED ORDER — FERROUS SULFATE 325 (65 FE) MG PO TABS: 325.0000 mg | ORAL_TABLET | Freq: Three times a day (TID) | ORAL | Status: DC

## 2012-08-10 MED ORDER — DSS 100 MG PO CAPS: 100.0000 mg | ORAL_CAPSULE | Freq: Two times a day (BID) | ORAL | Status: DC

## 2012-08-10 MED ORDER — DIPHENHYDRAMINE HCL 25 MG PO CAPS: 25.0000 mg | ORAL_CAPSULE | Freq: Four times a day (QID) | ORAL | Status: DC | PRN

## 2012-08-10 MED ORDER — HYDROCODONE-ACETAMINOPHEN 7.5-325 MG PO TABS: 1.0000 | ORAL_TABLET | ORAL | Status: DC | PRN

## 2012-08-10 MED ORDER — POLYETHYLENE GLYCOL 3350 17 G PO PACK: 17.0000 g | PACK | Freq: Two times a day (BID) | ORAL | Status: DC

## 2012-08-10 MED ORDER — METHOCARBAMOL 500 MG PO TABS: 500.0000 mg | ORAL_TABLET | Freq: Four times a day (QID) | ORAL | Status: DC | PRN

## 2012-08-10 NOTE — Progress Notes (Signed)
Physical Therapy Treatment Patient Details Name: Sheila Hartman MRN: [id number] DOB: May 15, 1951 Today's Date: 08/10/2012 Time: 0865-7846 PT Time Calculation (min): 29 min  PT Assessment / Plan / Recommendation Comments on Treatment Session  Performed flight of stairs using one right rail and one crutch. Given handout on HEP and performed all standing TE's. Pt plan to D/C to home today.    Follow Up Recommendations  Home health PT     Does the patient have the potential to tolerate intense rehabilitation     Barriers to Discharge        Equipment Recommendations  Rolling walker with 5" wheels    Recommendations for Other Services    Frequency 7X/week   Plan Discharge plan remains appropriate    Precautions / Restrictions Precautions Precautions: None Precaution Comments: Direct Anterior THR Restrictions Weight Bearing Restrictions: No RLE Weight Bearing: Weight bearing as tolerated    Pertinent Vitals/Pain "soreness" ICE applied    Mobility  Bed Mobility Bed Mobility: Supine to Sit;Sitting - Scoot to Edge of Bed Supine to Sit: 6: Modified independent (Device/Increase time) Sitting - Scoot to Edge of Bed: 6: Modified independent (Device/Increase time) Details for Bed Mobility Assistance: increased time  Transfers Transfers: Sit to Stand;Stand to Sit Sit to Stand: 6: Modified independent (Device/Increase time);From chair/3-in-1 Stand to Sit: 6: Modified independent (Device/Increase time);To chair/3-in-1 Details for Transfer Assistance: increased time and good use of hands  Ambulation/Gait Ambulation/Gait Assistance: 5: Supervision Ambulation Distance (Feet): 115 Feet Assistive device: Rolling walker Ambulation/Gait Assistance Details: feeling better, no c/o dizzyness.  Increased time and < 25% VC's on safety with turns inreguards to proper walker to self distance. Gait Pattern: Step-through pattern;Trunk flexed  Stairs: Yes Stairs Assistance: 5:  Supervision Stairs Assistance Details (indicate cue type and reason): 25% VC's on proper sequencing and crutch placement. Stair Management Technique: One rail Right;Forwards (one crutch) Number of Stairs: 12     Exercises Total Joint Exercises Knee Flexion: AROM;Right;10 reps;Standing Standing Hip Extension: Right;10 reps;Standing;AROM Standing Hip ABD: Right; 10 reps AROM Standing Hip flexion: Right; 10 reps AROM    PT Goals                                                               progressing    Visit Information  Last PT Received On: 08/10/12 Assistance Needed: +1                   End of Session PT - End of Session Equipment Utilized During Treatment: Gait belt Activity Tolerance: Patient tolerated treatment well Patient left: in chair;with call bell/phone within reach (ICE to right hip) Nurse Communication: Other (comment) (Pt ready for D/C to home)   Felecia Shelling  PTA [facility]  Acute  Rehab Pager     [phone]

## 2012-08-10 NOTE — Care Management Note (Signed)
    Page 1 of 2   08/10/2012     12:53:02 PM   CARE MANAGEMENT NOTE 08/10/2012  Patient:  Sheila Hartman, Sheila Hartman   Account Number:  [id number]  Date Initiated:  08/09/2012  Documentation initiated by:  Colleen Can  Subjective/Objective Assessment:   DX OSTEOARTHRITIS HIP-RIGHT; TOTAL HIP REPLACEMNT-RIGHT, ANTERIOR APPROACH     Action/Plan:   CM  spoke with patient. Plans are for her to return to her home in [place], [place] where family members willl be caregivers. Will need RW and commde chair. Wants agency in network   Anticipated DC Date:  08/10/2012   Anticipated DC Plan:  HOME W HOME HEALTH SERVICES  In-house referral  Clinical Social Worker      DC Associate Professor  CM consult      [facility] Choice  HOME HEALTH  DURABLE MEDICAL EQUIPMENT   Choice offered to / List presented to:  C-1 Patient   DME arranged  [facility]  3-N-1      DME agency  [facility]     HH arranged  HH-2 PT      [facility] agency  [facility]   Status of service:  Completed, signed off Medicare Important Message given?  NA - LOS <3 / Initial given by admissions (If response is "NO", the following Medicare IM given date fields will be blank) Date Medicare IM given:   Date Additional Medicare IM given:    Discharge Disposition:  HOME W HOME HEALTH SERVICES  Per UR Regulation:  Reviewed for med. necessity/level of care/duration of stay  If discussed at Long Length of Stay Meetings, dates discussed:    Comments:  08/10/2012 Colleen Can BSN RN CCM [phone] Pt will discharge today with [facility] providing [facility] services> Services will staqrt within 48hrs of discharge. Contact information for [facility] agency given to patient.

## 2012-08-10 NOTE — Progress Notes (Signed)
   Subjective: 2 Days Post-Op Procedure(s) (LRB): TOTAL HIP ARTHROPLASTY ANTERIOR APPROACH (Right)   Patient reports pain as mild, pain well controlled. No events throughout the night. Ready to be discharged home.  Objective:   VITALS:   Filed Vitals:   08/10/12 0617  BP: 106/69  Pulse: 81  Temp: 98.2 F (36.8 C)  Resp: 16    Neurovascular intact Dorsiflexion/Plantar flexion intact Incision: scant drainage No cellulitis present Compartment soft HV drain site draining moderately, dressing changed   LABS  Basename 08/10/12 0447 08/09/12 0440 08/08/12 1618  HGB 7.6* 8.1* 9.1*  HCT 23.9* 26.0* 28.3*  WBC 15.5* 13.6* --  PLT 240 233 --     Basename 08/10/12 0447 08/09/12 0440  NA 141 140  K 4.0 3.8  BUN 17 11  CREATININE 0.77 0.73  GLUCOSE 141* 129*     Assessment/Plan: 2 Days Post-Op Procedure(s) (LRB): TOTAL HIP ARTHROPLASTY ANTERIOR APPROACH (Right) BID or as needed dressing changes to the HV site with guaze and tape Up with therapy Discharge home with home health Follow up in 2 weeks at [facility]. Follow up with OLIN,Leylah Tarnow D in 2 weeks.  Contact information:  [facility] [address], [address] [place] [address] [phone]     Expected ABLA (asymptomatic) Treated with iron and will observe   Morbid Obesity (BMI >40)  Estimated Body mass index is 47.94 kg/(m^2) as calculated from the following:  Height as of this encounter: 5\' 6" (1.676 m).  Weight as of this encounter: 297 lb(134.718 kg).  Patient also counseled that weight may inhibit the healing process  Patient counseled that losing weight will help with future health issues     [name]. Pamelyn Bancroft   PAC  08/10/2012, 8:25 AM

## 2012-08-10 NOTE — Progress Notes (Signed)
Pt for discharge home today with [facility] PT per [facility]. Dressing CDI to R hip. Benzoin applied to [facility] site by MD this am d/t bleeding. Dressing supplies provided for home use. Tolerated PT session this am. DME's delivered for home use. Discharge instructions & Rx given with verbalized understanding. Awaiting friend to pick her up & assist with d/c.

## 2012-08-16 NOTE — Discharge Summary (Signed)
Physician Discharge Summary  Patient ID: [name] MRN: [id number] DOB/AGE: 59-Jan-1952 59 y.o.  Admit date: 08/08/2012 Discharge date: 08/10/2012   Procedures:  Procedure(s) (LRB): TOTAL HIP ARTHROPLASTY ANTERIOR APPROACH (Right)  Attending Physician:  Dr. Durene Romans   Admission Diagnoses:   Right hip OA / pain  Discharge Diagnoses:  Principal Problem:  *S/P right THA, AA Active Problems:  Expected blood loss anemia  Morbid obesity with BMI of 45.0-49.9, adult Hypertension   PTSD (post-traumatic stress disorder)   Panic attacks   TIA (transient ischemic attack)   GERD (gastroesophageal reflux disease)   Arthritis   HPI:  Sheila Hartman, 61 y.o. female, has a history of pain and functional disability in the right hip(s) due to arthritis and patient has failed non-surgical conservative treatments for greater than 12 weeks to include NSAID's and/or analgesics, corticosteriod injections, use of assistive devices and activity modification. Onset of symptoms was gradual starting 2 years ago with gradually worsening course since that time.The patient noted no past surgery on the right hip(s). Patient currently rates pain in the right hip at 9 out of 10 with activity. Patient has worsening of pain with activity and weight bearing, pain that interfers with activities of daily living and pain with passive range of motion. Patient has evidence of periarticular osteophytes and joint space narrowing by imaging studies. This condition presents safety issues increasing the risk of falls. Risks, benefits and expectations were discussed with the patient. Patient understand the risks, benefits and expectations and wishes to proceed with surgery.  PCP: Rodman Pickle, MD   Discharged Condition: good  Hospital Course:  Patient underwent the above stated procedure on 08/08/2012. Patient tolerated the procedure well and brought to the recovery room in good condition and subsequently  to the floor.  POD #1 BP: 121/76 ; Pulse: 99 ; Temp: 98.4 F (36.9 C) ; Resp: 16  Pt's foley was removed, as well as the hemovac drain removed. IV was changed to a saline lock. Patient reports pain as mild, pain well controlled. Feels that she is doing really well. No events throughout the night.  Neurovascular intact, dorsiflexion/plantar flexion intact, incision: dressing C/D/I, no cellulitis present and compartment soft.   LABS  Basename  08/09/12 0440   HGB  8.1  HCT  26.0   POD #2  BP: 106/69 ; Pulse: 81 ; Temp: 98.2 F (36.8 C) ; Resp: 16 Patient reports pain as mild, pain well controlled. No events throughout the night. Ready to be discharged home. Neurovascular intact, dorsiflexion/plantar flexion intact, incision: dressing C/D/I, no cellulitis present and compartment soft.   LABS  Basename  08/10/12 0447   HGB  7.6  HCT  23.9    Discharge Exam: General appearance: alert, cooperative and no distress Extremities: Homans sign is negative, no sign of DVT, no edema, redness or tenderness in the calves or thighs and no ulcers, gangrene or trophic changes  Disposition:  Home-Health Care Svc with follow up in 2 weeks   Follow-up Information    Follow up with Shelda Pal, MD. In 2 weeks.   Contact information:   [address] [place] [address] [phone]          Discharge Orders    Future Orders Please Complete By Expires   Diet - low sodium heart healthy      Call MD / Call 911      Comments:   If you experience chest pain or shortness of breath,  CALL 911 and be transported to the hospital emergency room.  If you develope a fever above 101 F, pus (white drainage) or increased drainage or redness at the wound, or calf pain, call your surgeon's office.   Discharge instructions      Comments:   Maintain surgical dressing for 10-14 days, then replace with gauze and tape. Twice daily dressing changes or as needed to the hemo-vac site, until it  stops draining.  Keep the area dry and clean until follow up. Follow up in 2 weeks at [facility]. Call with any questions or concerns.   Constipation Prevention      Comments:   Drink plenty of fluids.  Prune juice may be helpful.  You may use a stool softener, such as Colace (over the counter) 100 mg twice a day.  Use MiraLax (over the counter) for constipation as needed.   Increase activity slowly as tolerated      Change dressing      Comments:   Maintain surgical dressing for 10-14 days, then replace with 4x4 guaze and tape. Keep the area dry and clean.   TED hose      Comments:   Use stockings (TED hose) for 2 weeks on both leg(s).  You may remove them at night for sleeping.      Discharge Medication List as of 08/10/2012 11:23 AM    START taking these medications   Details  diphenhydrAMINE (BENADRYL) 25 mg capsule Take 1 capsule (25 mg total) by mouth every 6 (six) hours as needed for itching, allergies or sleep., Starting 08/10/2012, Until Discontinued, No Print    docusate sodium 100 MG CAPS Take 100 mg by mouth 2 (two) times daily., Starting 08/10/2012, Until Discontinued, No Print    ferrous sulfate 325 (65 FE) MG tablet Take 1 tablet (325 mg total) by mouth 3 (three) times daily after meals., Starting 08/10/2012, Until Discontinued, No Print    HYDROcodone-acetaminophen (NORCO) 7.5-325 MG per tablet Take 1-2 tablets by mouth every 4 (four) hours as needed for pain., Starting 08/10/2012, Until Discontinued, Print    methocarbamol (ROBAXIN) 500 MG tablet Take 1 tablet (500 mg total) by mouth every 6 (six) hours as needed (muscle spasms)., Starting 08/10/2012, Until Discontinued, No Print    polyethylene glycol (MIRALAX / GLYCOLAX) packet Take 17 g by mouth 2 (two) times daily., Starting 08/10/2012, Until Discontinued, No Print      CONTINUE these medications which have CHANGED   Details  aspirin 325 MG tablet Take 1 tablet (325 mg total) by mouth 2 (two) times  daily., Starting 08/10/2012, Until Fri 08/10/13, No Print      CONTINUE these medications which have NOT CHANGED   Details  amLODipine (NORVASC) 5 MG tablet Take 5 mg by mouth every morning., Starting 06/29/2012, Until Discontinued, Historical Med    benazepril (LOTENSIN) 10 MG tablet Take 10 mg by mouth every morning., Starting 06/29/2012, Until Discontinued, Historical Med      STOP taking these medications     diclofenac (VOLTAREN) 75 MG EC tablet Comments:  Reason for Stopping:           Signed: [name]. Makilah Dowda   PAC  08/16/2012, 4:01 PM

## 2012-10-16 ENCOUNTER — Ambulatory Visit (INDEPENDENT_AMBULATORY_CARE_PROVIDER_SITE_OTHER): Payer: Medicaid Other | Admitting: Family Medicine

## 2012-10-16 ENCOUNTER — Encounter: Payer: Self-pay | Admitting: Family Medicine

## 2012-10-16 VITALS — BP 139/93 | HR 83 | Temp 97.4°F | Ht 66.0 in | Wt 302.0 lb

## 2012-10-16 DIAGNOSIS — I10 Essential (primary) hypertension: Secondary | ICD-10-CM

## 2012-10-16 DIAGNOSIS — M25559 Pain in unspecified hip: Secondary | ICD-10-CM

## 2012-10-16 DIAGNOSIS — M79609 Pain in unspecified limb: Secondary | ICD-10-CM

## 2012-10-16 DIAGNOSIS — M79602 Pain in left arm: Secondary | ICD-10-CM

## 2012-10-16 MED ORDER — AMLODIPINE BESY-BENAZEPRIL HCL 5-20 MG PO CAPS: 1.0000 | ORAL_CAPSULE | Freq: Every day | ORAL | Status: DC

## 2012-10-16 NOTE — Assessment & Plan Note (Signed)
BP close to goal today. Will change medication to Lotrel 5-20mg  since she would rather take one pill daily and has done well on this in the past. F/u in 3 months and will need routine labs at that time.

## 2012-10-16 NOTE — Assessment & Plan Note (Signed)
Doing well after surgery. Will follow up with Dr. Charlann Boxer in a few weeks. Continue to use cane until cleared by Dr. Charlann Boxer.

## 2012-10-16 NOTE — Assessment & Plan Note (Signed)
Pain is not limiting her daily life. Could be biceps tendon strain or mild rotator cuff injury. ROM is good, continue exercises and take Aleve as needed for pain. I do not think the pain is interarticular therefore steroid injection was not given today, but if pain persists will consider that as an alternative treatment.

## 2012-10-16 NOTE — Progress Notes (Signed)
Patient ID: Sheila Hartman, female   DOB: 1951-08-29, 62 y.o.   MRN: [id number]  [name] Lavontae Cornia M. Shmiel Morton, MD Phone: [phone]   Subjective: HPI: Patient is a 62 y.o. female presenting to clinic today for follow up appointment. Concerns today include left arm pain  1. S/p hip replacement- Had surgery in November 2013. Doing well after surgery. Pain is well controlled. She states that Dr. Charlann Boxer let her know that there was a small piece of an instrument that broke off during surgery that will not cause any problems. PT x 2-3 weeks and used walker for 2 weeks. Now with cane but trying to wean herself off that as well. Will take time to fully recover and she is pleased with result.   2. Hypertension - Long-standing history. Blood pressure today: 139/93 Taking Meds: Taking benazepril and norvasc and requesting change back to Lotrel.  Side effects: None ROS: Denies headache, visual changes, nausea, vomiting, chest pain, abdominal pain or shortness of breath.  3. Arm pain- right sided arm pain for many months. Upper arm. 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 Does not interfere with daily life but does have to rest her shoulder. Does use the cane in that arm but it was hurting before the surgery. Feels like it is arthritis. Tried Aleve with some relief but continues to have pain.  History Reviewed: Non smoker. Health Maintenance: Decline flu shot  ROS: Please see HPI above.  Objective: Office vital signs reviewed. BP 139/93  Pulse 83  Temp 97.4 F (36.3 C) (Oral)  Ht 5\' 6"  (1.676 m)  Wt 302 lb (136.986 kg)  BMI 48.74 kg/m2  Physical Examination:  General: Awake, alert. NAD. Very pleasant HEENT: Atraumatic, normocephalic.  Pulm: CTAB, no wheezes Cardio: RRR, no murmurs appreciated Abdomen: Obese+BS, soft, nontender, nondistended Extremities: Left upper extremity with some mild TTP over biceps tendon. Full ROM with no pops or  clicks. Good strength.   Right hip with well healed scar. No extremity edema. Neuro: Grossly intact. Walks with cane.  Assessment: 62 y.o. female follow up appointment  Plan: See Problem List and After Visit Summary

## 2012-10-16 NOTE — Patient Instructions (Signed)
I have changed your blood pressure medication to Lotrel 5-20mg   Start taking baby aspirin rather than the full dose.  For your shoulder, I have given you some range of motion exercises. I would also take Aleve as needed for anti-inflammatory.  I will see you back in 3 months.  Amber M. Hairford, M.D.  Shoulder Exercises EXERCISES  RANGE OF MOTION (ROM) AND STRETCHING EXERCISES These exercises may help you when beginning to rehabilitate your injury. Your symptoms may resolve with or without further involvement from your physician, physical therapist or athletic trainer. While completing these exercises, remember:   Restoring tissue flexibility helps normal motion to return to the joints. This allows healthier, less painful movement and activity.  An effective stretch should be held for at least 30 seconds.  A stretch should never be painful. You should only feel a gentle lengthening or release in the stretched tissue. ROM - Pendulum  Bend at the waist so that your right / left arm falls away from your body. Support yourself with your opposite hand on a solid surface, such as a table or a countertop.  Your right / left arm should be perpendicular to the ground. If it is not perpendicular, you need to lean over farther. Relax the muscles in your right / left arm and shoulder as much as possible.  Gently sway your hips and trunk so they move your right / left arm without any use of your right / left shoulder muscles.  Progress your movements so that your right / left arm moves side to side, then forward and backward, and finally, both clockwise and counterclockwise.  Complete __________ repetitions in each direction. Many people use this exercise to relieve discomfort in their shoulder as well as to gain range of motion. Repeat __________ times. Complete this exercise __________ times per day. STRETCH  Flexion, Standing  Stand with good posture. With an underhand grip on your right /  left hand and an overhand grip on the opposite hand, grasp a broomstick or cane so that your hands are a little more than shoulder-width apart.  Keeping your right / left elbow straight and shoulder muscles relaxed, push the stick with your opposite hand to raise your right / left arm in front of your body and then overhead. Raise your arm until you feel a stretch in your right / left shoulder, but before you have increased shoulder pain.  Try to avoid shrugging your right / left shoulder as your arm rises by keeping your shoulder blade tucked down and toward your mid-back spine. Hold __________ seconds.  Slowly return to the starting position. Repeat __________ times. Complete this exercise __________ times per day. STRETCH - Internal Rotation  Place your right / left hand behind your back, palm-up.  Throw a towel or belt over your opposite shoulder. Grasp the towel/belt with your right / left hand.  While keeping an upright posture, gently pull up on the towel/belt until you feel a stretch in the front of your right / left shoulder.  Avoid shrugging your right / left shoulder as your arm rises by keeping your shoulder blade tucked down and toward your mid-back spine.  Hold __________. Release the stretch by lowering your opposite hand. Repeat __________ times. Complete this exercise __________ times per day. STRETCH - External Rotation and Abduction  Stagger your stance through a doorframe. It does not matter which foot is forward.  As instructed by your physician, physical therapist or athletic trainer, place your hands:  And forearms above your head and on the door frame.  And forearms at head-height and on the door frame.  At elbow-height and on the door frame.  Keeping your head and chest upright and your stomach muscles tight to prevent over-extending your low-back, slowly shift your weight onto your front foot until you feel a stretch across your chest and/or in the front of  your shoulders.  Hold __________ seconds. Shift your weight to your back foot to release the stretch. Repeat __________ times. Complete this stretch __________ times per day.  STRENGTHENING EXERCISES  These exercises may help you when beginning to rehabilitate your injury. They may resolve your symptoms with or without further involvement from your physician, physical therapist or athletic trainer. While completing these exercises, remember:   Muscles can gain both the endurance and the strength needed for everyday activities through controlled exercises.  Complete these exercises as instructed by your physician, physical therapist or athletic trainer. Progress the resistance and repetitions only as guided.  You may experience muscle soreness or fatigue, but the pain or discomfort you are trying to eliminate should never worsen during these exercises. If this pain does worsen, stop and make certain you are following the directions exactly. If the pain is still present after adjustments, discontinue the exercise until you can discuss the trouble with your clinician.  If advised by your physician, during your recovery, avoid activity or exercises which involve actions that place your right / left hand or elbow above your head or behind your back or head. These positions stress the tissues which are trying to heal. STRENGTH - Scapular Depression and Adduction  With good posture, sit on a firm chair. Supported your arms in front of you with pillows, arm rests or a table top. Have your elbows in line with the sides of your body.  Gently draw your shoulder blades down and toward your mid-back spine. Gradually increase the tension without tensing the muscles along the top of your shoulders and the back of your neck.  Hold for __________ seconds. Slowly release the tension and relax your muscles completely before completing the next repetition.  After you have practiced this exercise, remove the arm  support and complete it in standing as well as sitting. Repeat __________ times. Complete this exercise __________ times per day.  STRENGTH - External Rotators  Secure a rubber exercise band/tubing to a fixed object so that it is at the same height as your right / left elbow when you are standing or sitting on a firm surface.  Stand or sit so that the secured exercise band/tubing is at your side that is not injured.  Bend your elbow 90 degrees. Place a folded towel or small pillow under your right / left arm so that your elbow is a few inches away from your side.  Keeping the tension on the exercise band/tubing, pull it away from your body, as if pivoting on your elbow. Be sure to keep your body steady so that the movement is only coming from your shoulder rotating.  Hold __________ seconds. Release the tension in a controlled manner as you return to the starting position. Repeat __________ times. Complete this exercise __________ times per day.  STRENGTH - Supraspinatus  Stand or sit with good posture. Grasp a __________ weight or an exercise band/tubing so that your hand is "thumbs-up," like when you shake hands.  Slowly lift your right / left hand from your thigh into the air, traveling about 30 degrees  from straight out at your side. Lift your hand to shoulder height or as far as you can without increasing any shoulder pain. Initially, many people do not lift their hands above shoulder height.  Avoid shrugging your right / left shoulder as your arm rises by keeping your shoulder blade tucked down and toward your mid-back spine.  Hold for __________ seconds. Control the descent of your hand as you slowly return to your starting position. Repeat __________ times. Complete this exercise __________ times per day.  STRENGTH - Shoulder Extensors  Secure a rubber exercise band/tubing so that it is at the height of your shoulders when you are either standing or sitting on a firm arm-less  chair.  With a thumbs-up grip, grasp an end of the band/tubing in each hand. Straighten your elbows and lift your hands straight in front of you at shoulder height. Step back away from the secured end of band/tubing until it becomes tense.  Squeezing your shoulder blades together, pull your hands down to the sides of your thighs. Do not allow your hands to go behind you.  Hold for __________ seconds. Slowly ease the tension on the band/tubing as you reverse the directions and return to the starting position. Repeat __________ times. Complete this exercise __________ times per day.  STRENGTH - Scapular Retractors  Secure a rubber exercise band/tubing so that it is at the height of your shoulders when you are either standing or sitting on a firm arm-less chair.  With a palm-down grip, grasp an end of the band/tubing in each hand. Straighten your elbows and lift your hands straight in front of you at shoulder height. Step back away from the secured end of band/tubing until it becomes tense.  Squeezing your shoulder blades together, draw your elbows back as you bend them. Keep your upper arm lifted away from your body throughout the exercise.  Hold __________ seconds. Slowly ease the tension on the band/tubing as you reverse the directions and return to the starting position. Repeat __________ times. Complete this exercise __________ times per day. STRENGTH  Scapular Depressors  Find a sturdy chair without wheels, such as a from a dining room table.  Keeping your feet on the floor, lift your bottom from the seat and lock your elbows.  Keeping your elbows straight, allow gravity to pull your body weight down. Your shoulders will rise toward your ears.  Raise your body against gravity by drawing your shoulder blades down your back, shortening the distance between your shoulders and ears. Although your feet should always maintain contact with the floor, your feet should progressively support less  body weight as you get stronger.  Hold __________ seconds. In a controlled and slow manner, lower your body weight to begin the next repetition. Repeat __________ times. Complete this exercise __________ times per day.  Document Released: 07/21/2005 Document Revised: 11/29/2011 Document Reviewed: 12/19/2008 [facility] Patient Information 2013 [place], [place].

## 2012-12-20 ENCOUNTER — Encounter: Payer: Self-pay | Admitting: Family Medicine

## 2012-12-20 ENCOUNTER — Ambulatory Visit (INDEPENDENT_AMBULATORY_CARE_PROVIDER_SITE_OTHER): Payer: Medicaid Other | Admitting: Family Medicine

## 2012-12-20 VITALS — BP 149/84 | HR 112 | Temp 98.6°F | Ht 66.0 in | Wt 302.0 lb

## 2012-12-20 DIAGNOSIS — M25579 Pain in unspecified ankle and joints of unspecified foot: Secondary | ICD-10-CM

## 2012-12-20 DIAGNOSIS — M25572 Pain in left ankle and joints of left foot: Secondary | ICD-10-CM

## 2012-12-20 NOTE — Progress Notes (Signed)
Patient ID: Sheila Hartman, female   DOB: 1951-09-17, 62 y.o.   MRN: [id number]  [name] Brienne Liguori M. Yarnell Arvidson, MD Phone: [phone]   Subjective: HPI: Patient is a 62 y.o. female presenting to clinic today for same day appointment for left foot pain. She has had left lateral heel pain for about 10 years but getting worse. She had cortisone shot in [place] (?8 years ago) which helped. Worse with shoes with back on them. Prefers wearing clogs. Does not limit daily activity, but it is annoying.Takes Aleve as needed. No obvious swelling or redness, no bruising. No injury that she is aware of.  S/p right hip replacement but heel pain was before that.   History Reviewed: Non-smoker. Health Maintenance: UTD  ROS: Please see HPI above.  Objective: Office vital signs reviewed. There were no vitals taken for this visit.  Physical Examination:  General: Awake, alert. NAD HEENT: Atraumatic, normocephalic Extremities: No edema, no rashes. Good peripheral pulses.  Left heel without erythema or breakdown. TTP lateral ankle, distal to mallelous. No TTP of sole of foot. Ankle stable with no pops/clicks. Good strength. Gait observed with Dr. Jennette Kettle. She has a short stride with slightly outward pointing toes on left. Neuro: Grossly intact  Assessment: 62 y.o. female with foot pain  Plan: See Problem List and After Visit Summary

## 2012-12-20 NOTE — Patient Instructions (Addendum)
It was good to see you today. Continue taking Aleve as needed for pain. We will give you a call to make an appointment with Dr. Jennette Kettle in sports medicine clinic for orthotics. I have attached the information sheet we give patients starting nitro for injury. If you decide to start this before you see Dr. Jennette Kettle, let me know and I will send it to the pharmacy.  Take care! Sheila Hartman, M.D.  FYI: Nitroglycerin Protocol   Apply 1/8-1/4 nitroglycerin patch to affected area daily.  Change position of patch within the affected area every 24 hours.  You may experience a headache during the first 1-2 weeks of using the patch, these should subside.  If you experience headaches after beginning nitroglycerin patch treatment, you may take your preferred over the counter pain reliever.  Another side effect of the nitroglycerin patch is skin irritation or rash related to patch adhesive.  Please notify our office if you develop more severe headaches or rash, and stop the patch.  Tendon healing with nitroglycerin patch may require 12 to 24 weeks depending on the extent of injury.  Men should not use if taking Viagra, Cialis, or Levitra.   Do not use if you have migraines or rosacea.

## 2012-12-20 NOTE — Assessment & Plan Note (Signed)
Discussed with Dr. Jennette Kettle. Patient most likely has chronic tendon tear or strain from gait abnormality after right hip replacement. Discussed starting nitro patch with patient but she is hesitant at this time. Given information and have encouraged her to consider it for tendon healing. Will refer to [facility] for orthotic evaluation with Dr. Jennette Kettle and follow up with me after that. Pt agrees with plan.

## 2012-12-22 ENCOUNTER — Encounter: Payer: Self-pay | Admitting: Family Medicine

## 2012-12-22 ENCOUNTER — Ambulatory Visit (INDEPENDENT_AMBULATORY_CARE_PROVIDER_SITE_OTHER): Payer: Medicaid Other | Admitting: Family Medicine

## 2012-12-22 VITALS — BP 128/87 | HR 80 | Ht 66.0 in | Wt 302.0 lb

## 2012-12-22 DIAGNOSIS — M25579 Pain in unspecified ankle and joints of unspecified foot: Secondary | ICD-10-CM

## 2012-12-22 DIAGNOSIS — M25572 Pain in left ankle and joints of left foot: Secondary | ICD-10-CM

## 2012-12-22 NOTE — Progress Notes (Signed)
Patient ID: Sheila Hartman, female   DOB: 09/20/1951, 62 y.o.   MRN: [id number] Patient here for orthotics. I had seen her briefly in family medicine clinic with her PCP. She had recently had hip replacement. Since then she's been having left posterior ankle and heel pain. OBJECTIVE: Well-developed female no acute distress Patient was fitted for a : standard, cushioned, semi-rigid orthotic. The orthotic was heated, placed on the orthotic stand. The patient was positioned in subtalar neutral position and 10 degrees of ankle dorsiflexion in a weight bearing stance on the heated orthotic blank After completion of molding, a stable base was applied to the orthotic blank. The blank was ground to a stable position for weight bearing. Blank: Red size 11 Base: Blue foam Posting: Left lateral heel.  Face to face time spent in evaluation, measurement and manufacture of custom molded orthotic was 40 minutes.

## 2013-01-12 ENCOUNTER — Ambulatory Visit: Payer: Medicaid Other | Admitting: Family Medicine

## 2013-02-16 ENCOUNTER — Ambulatory Visit (INDEPENDENT_AMBULATORY_CARE_PROVIDER_SITE_OTHER): Payer: Medicaid Other | Admitting: Family Medicine

## 2013-02-16 VITALS — BP 148/81 | HR 102 | Temp 97.9°F | Wt 305.0 lb

## 2013-02-16 DIAGNOSIS — M62838 Other muscle spasm: Secondary | ICD-10-CM

## 2013-02-16 MED ORDER — CYCLOBENZAPRINE HCL 5 MG PO TABS: 5.0000 mg | ORAL_TABLET | Freq: Three times a day (TID) | ORAL | Status: DC | PRN

## 2013-02-16 NOTE — Patient Instructions (Addendum)
I think you have a muscle spasm -Time will help -Warm compresses will help   Soak a towel in warm water or use a heating pad -Try Alleve  -Try the muscle relaxant (Flexeril)   Do 1 tablet first. If this helps but is not strong enough, you can take up to 2 tablets every 8 hours.  -Try a neck brace if you think it will make you more comfortable   Medical supply stores will carry this    Follow-up in 1-2 weeks if the pain is not getting better or worsens
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
## 2013-02-16 NOTE — Progress Notes (Signed)
  Subjective:    Patient ID: Sheila Hartman, female    DOB: 09/29/1950, 62 y.o.   MRN: [id number]  HPI # SDA Right-sided neck pain, radiates to shoulder She woke up this morning and it was there. It happened one day last week and it went away.   She has trouble driving because she cannot turn her head  Medications:  -None tried  Alleviated by:  -Nothing  Exacerbated by: moving head    Review of Systems Denies paresthesias, fevers, chills  Allergies, medication, past medical history reviewed.  Smoking status noted. TIA 06/2012 Joint pain: ankle, foot, hip Morbid obesity HTN     Objective:   Physical Exam GEN: NAD; obese NECK:    Appearance: normal   Tender: along right trapezius with spasm; no tenderness on left   Pain with turning head to right with ROM 160 deg on right versus 170 on left NEURO: 5/5 strength upper extremities, sensation intact; warm; 2+ radial pulses    Assessment & Plan:

## 2013-03-15 ENCOUNTER — Encounter (HOSPITAL_BASED_OUTPATIENT_CLINIC_OR_DEPARTMENT_OTHER): Payer: Self-pay | Admitting: *Deleted

## 2013-03-15 ENCOUNTER — Emergency Department (HOSPITAL_BASED_OUTPATIENT_CLINIC_OR_DEPARTMENT_OTHER)
Admission: EM | Admit: 2013-03-15 | Discharge: 2013-03-15 | Disposition: A | Discharge status: Home / Self Care | Payer: Medicaid Other | Attending: Emergency Medicine | Admitting: Emergency Medicine

## 2013-03-15 DIAGNOSIS — Z79899 Other long term (current) drug therapy: Secondary | ICD-10-CM | POA: Insufficient documentation

## 2013-03-15 DIAGNOSIS — H547 Unspecified visual loss: Secondary | ICD-10-CM | POA: Insufficient documentation

## 2013-03-15 DIAGNOSIS — Z8673 Personal history of transient ischemic attack (TIA), and cerebral infarction without residual deficits: Secondary | ICD-10-CM | POA: Insufficient documentation

## 2013-03-15 DIAGNOSIS — Z880 Allergy status to penicillin: Secondary | ICD-10-CM | POA: Insufficient documentation

## 2013-03-15 DIAGNOSIS — H538 Other visual disturbances: Secondary | ICD-10-CM | POA: Insufficient documentation

## 2013-03-15 DIAGNOSIS — I10 Essential (primary) hypertension: Secondary | ICD-10-CM | POA: Insufficient documentation

## 2013-03-15 DIAGNOSIS — Z7982 Long term (current) use of aspirin: Secondary | ICD-10-CM | POA: Insufficient documentation

## 2013-03-15 DIAGNOSIS — Z87891 Personal history of nicotine dependence: Secondary | ICD-10-CM | POA: Insufficient documentation

## 2013-03-15 DIAGNOSIS — M129 Arthropathy, unspecified: Secondary | ICD-10-CM | POA: Insufficient documentation

## 2013-03-15 DIAGNOSIS — Z8659 Personal history of other mental and behavioral disorders: Secondary | ICD-10-CM | POA: Insufficient documentation

## 2013-03-15 DIAGNOSIS — Z8719 Personal history of other diseases of the digestive system: Secondary | ICD-10-CM | POA: Insufficient documentation

## 2013-03-15 MED ORDER — FLUORESCEIN SODIUM 1 MG OP STRP
ORAL_STRIP | OPHTHALMIC | Status: AC
  Filled 2013-03-15: qty 1

## 2013-03-15 MED ORDER — TETRACAINE HCL 0.5 % OP SOLN
OPHTHALMIC | Status: AC
  Administered 2013-03-15: 2 [drp] via OPHTHALMIC
  Filled 2013-03-15: qty 2

## 2013-03-15 MED ORDER — TETRACAINE HCL 0.5 % OP SOLN
2.0000 [drp] | Freq: Once | OPHTHALMIC | Status: AC
  Administered 2013-03-15: 2 [drp] via OPHTHALMIC
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
## 2013-03-15 NOTE — ED Notes (Signed)
Pt c/o right eye irritation and blurred vision and " floaters" x 6 hrs

## 2013-03-15 NOTE — ED Notes (Signed)
Doctor will be calling back to [phone]

## 2013-03-19 ENCOUNTER — Telehealth: Payer: Self-pay | Admitting: Family Medicine

## 2013-03-19 DIAGNOSIS — H547 Unspecified visual loss: Secondary | ICD-10-CM

## 2013-03-19 NOTE — Telephone Encounter (Signed)
Referral placed for opthalmology. Patient already has appointment.  Thank you! Cinque Begley M. Ziara Thelander, M.D.

## 2013-03-19 NOTE — Telephone Encounter (Signed)
Patient was seen at [facility] ER on 6/26 lost vision in right eye. [facility] ER sent her for a follow up at Dr. Clarisa Kindred  At Dr. Jacelyn Pi office on [address]. [phone] and said that she needs a referral so that she can continue to go there. She has an appointment this WED 7/2 and needs this done before they will see her. JW

## 2013-03-20 DIAGNOSIS — H4311 Vitreous hemorrhage, right eye: Secondary | ICD-10-CM

## 2013-03-20 HISTORY — DX: Vitreous hemorrhage, right eye: H43.11

## 2013-03-21 ENCOUNTER — Other Ambulatory Visit: Payer: Self-pay | Admitting: Ophthalmology

## 2013-03-27 ENCOUNTER — Encounter (HOSPITAL_COMMUNITY): Payer: Self-pay | Admitting: Pharmacy Technician

## 2013-03-29 NOTE — H&P (Signed)
  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 She notes modest improvement. She can see some shapes and shadows. History of Present Illness:   She has no other known eye problems. Past History:  Allergies:  Penicillin, hives, Active Medications:   Other Medications:  Lotril 1 po QD, 81 ASA QD Birth History:  none Past Ocular History:   Nystagmus Past Medical History:   Hypertension Arthritis Past Surgical History:   Hysterectomy 1997 Hip repalcement right side. Left knee arthroscopy Family History: , Sister has Nystagmus as well, a cousin has nystragmus in one eye All other family history is negative. Social History:   Smoking Status: former smoker  Alcohol:  none  Review of Systems:   Eyes: + decreased vision  All other systems are negative.  Examination:  Visual Acuity:   Distance [facility] [place]:  OD: CF 25ft    OS: 20/200 ph 20/80 IOP:  OD:  18     OS:  18    @ 05:11PM (Goldmann applanation)  Confrontation visual field:  OU:  Normal  Motility:  nystagmus  Pupils:  OU:  Shape, size, direct and consensual reaction normal  Adnexa:  Preauricular LN, lacrimal drainage, lacrimal glands, orbit normal  Eyelids:  Eyelids:  normal Conjunctiva:  OU:  bulbar, palpebral normal  Cornea:  OU:  epithelium, stroma, endothelium, tear film normal  Anterior Chamber:  OU:  depth normal, no cell, no flare, 3+ deep / clear  Iris:  OU:  normal Dilation:  OU: Tropicamide 1%/ N 2.5% @ 03:11PM  Lens:  OD:  clear without abnormal findings  Vitreous:  OD:  4+, vitreous hemorrhage OS:  normal  Optic Disc:  OD:  cupping: no view OS:  cupping: 0.1 , small, tilted  Macula:  OD:  No view OS:  normal    Vessels:  OS:  normal    Periphery:  OS:  normal  Orientation to person, place and time:  Normal  Mood and affect:  Normal  Impression:  379.23  Vitreous Hemorrhage OD: ? etiology -  -retina attached on BScan x 03/21/2013 224.60  Choroidal Nevus OD  Plan/Treatment:  We discussed her options as I am still unable to see the retina and I cannot rule out a torn retina, etc. I cannot treat the cause for the time being. She expresses the desire to proceed with surgery to remove the blood and treat the source at the time of surgery.  She indicated understanding our discussion and felt that her questions had been answered to her satisfaction.   Patient Instructions: Please do not eat anything after mignight the day before surgery. Return to clinic:  Thursday July 17th at 5:00 PM. for post-operative follow-up  Schedule:  Pars Plana, Vitrectomy, Membrane Peeling, Endolaser, Fluid Gas Exchange Right Eye x 04/04/2016   (electronically signed) Shade Flood, MD

## 2013-04-03 ENCOUNTER — Encounter (HOSPITAL_COMMUNITY): Payer: Self-pay

## 2013-04-03 ENCOUNTER — Encounter (HOSPITAL_COMMUNITY)
Admission: RE | Admit: 2013-04-03 | Discharge: 2013-04-03 | Disposition: A | Discharge status: Home / Self Care | Payer: Medicaid Other | Source: Ambulatory Visit | Attending: Ophthalmology | Admitting: Ophthalmology

## 2013-04-03 ENCOUNTER — Ambulatory Visit (HOSPITAL_COMMUNITY)
Admission: RE | Admit: 2013-04-03 | Discharge: 2013-04-03 | Disposition: A | Discharge status: Home / Self Care | Payer: Medicaid Other | Source: Ambulatory Visit | Attending: Anesthesiology | Admitting: Anesthesiology

## 2013-04-03 HISTORY — DX: Vitreous hemorrhage, right eye: H43.11

## 2013-04-03 LAB — CBC
HCT: 39 % (ref 36.0–46.0)
MCHC: 32.8 g/dL (ref 30.0–36.0)
MCV: 76.8 fL — ABNORMAL LOW (ref 78.0–100.0)
RDW: 14.9 % (ref 11.5–15.5)

## 2013-04-03 LAB — BASIC METABOLIC PANEL
BUN: 13 mg/dL (ref 6–23)
Creatinine, Ser: 0.95 mg/dL (ref 0.50–1.10)
GFR calc Af Amer: 75 mL/min — ABNORMAL LOW (ref >90)
GFR calc non Af Amer: 64 mL/min — ABNORMAL LOW (ref >90)
Potassium: 3.6 mEq/L (ref 3.5–5.1)

## 2013-04-03 NOTE — Pre-Procedure Instructions (Signed)
JAYLEAN BUENAVENTURA  04/03/2013   Your procedure is scheduled on:  Wednesday, July 16  Report to [facility] at 0830 AM.  Call this number if you have problems the morning of surgery: [phone]   Remember:   Do not eat food or drink liquids after midnight.   Take these medicines the morning of surgery with A SIP OF WATER: Lotrel   Do not wear jewelry, make-up or nail polish.  Do not wear lotions, powders, or perfumes. You may wear deodorant.  Do not shave 48 hours prior to surgery.   Do not bring valuables to the hospital.  [facility] is not responsible  for any belongings or valuables.  Contacts, dentures or bridgework may not be worn into surgery.      Patients discharged the day of surgery will not be allowed to drive  home.  Name and phone number of your driver:    Special Instructions: Shower using CHG 2 nights before surgery and the night before surgery.  If you shower the day of surgery use CHG.  Use special wash - you have one bottle of CHG for all showers.  You should use approximately 1/3 of the bottle for each shower.   Please read over the following fact sheets that you were given: Pain Booklet, Coughing and Deep Breathing and Surgical Site Infection Prevention

## 2013-04-04 ENCOUNTER — Ambulatory Visit (HOSPITAL_COMMUNITY)
Admission: RE | Admit: 2013-04-04 | Discharge: 2013-04-04 | Disposition: A | Discharge status: Home / Self Care | Payer: Medicaid Other | Source: Ambulatory Visit | Attending: Ophthalmology | Admitting: Ophthalmology

## 2013-04-04 ENCOUNTER — Encounter (HOSPITAL_COMMUNITY): Payer: Self-pay | Admitting: Anesthesiology

## 2013-04-04 ENCOUNTER — Ambulatory Visit (HOSPITAL_COMMUNITY): Payer: Medicaid Other | Admitting: Anesthesiology

## 2013-04-04 ENCOUNTER — Encounter (HOSPITAL_COMMUNITY): Payer: Self-pay | Admitting: *Deleted

## 2013-04-04 ENCOUNTER — Encounter (HOSPITAL_COMMUNITY)
Admission: RE | Disposition: A | Discharge status: Home / Self Care | Payer: Self-pay | Source: Ambulatory Visit | Attending: Ophthalmology

## 2013-04-04 DIAGNOSIS — H5500 Unspecified nystagmus: Secondary | ICD-10-CM | POA: Insufficient documentation

## 2013-04-04 DIAGNOSIS — K219 Gastro-esophageal reflux disease without esophagitis: Secondary | ICD-10-CM | POA: Insufficient documentation

## 2013-04-04 DIAGNOSIS — M129 Arthropathy, unspecified: Secondary | ICD-10-CM | POA: Insufficient documentation

## 2013-04-04 DIAGNOSIS — H33009 Unspecified retinal detachment with retinal break, unspecified eye: Secondary | ICD-10-CM | POA: Insufficient documentation

## 2013-04-04 DIAGNOSIS — Z880 Allergy status to penicillin: Secondary | ICD-10-CM | POA: Insufficient documentation

## 2013-04-04 DIAGNOSIS — H35419 Lattice degeneration of retina, unspecified eye: Secondary | ICD-10-CM | POA: Insufficient documentation

## 2013-04-04 DIAGNOSIS — Z7982 Long term (current) use of aspirin: Secondary | ICD-10-CM | POA: Insufficient documentation

## 2013-04-04 DIAGNOSIS — Z90710 Acquired absence of both cervix and uterus: Secondary | ICD-10-CM | POA: Insufficient documentation

## 2013-04-04 DIAGNOSIS — Z8673 Personal history of transient ischemic attack (TIA), and cerebral infarction without residual deficits: Secondary | ICD-10-CM | POA: Insufficient documentation

## 2013-04-04 DIAGNOSIS — F411 Generalized anxiety disorder: Secondary | ICD-10-CM | POA: Insufficient documentation

## 2013-04-04 DIAGNOSIS — Z96649 Presence of unspecified artificial hip joint: Secondary | ICD-10-CM | POA: Insufficient documentation

## 2013-04-04 DIAGNOSIS — Z87891 Personal history of nicotine dependence: Secondary | ICD-10-CM | POA: Insufficient documentation

## 2013-04-04 DIAGNOSIS — Z79899 Other long term (current) drug therapy: Secondary | ICD-10-CM | POA: Insufficient documentation

## 2013-04-04 DIAGNOSIS — I10 Essential (primary) hypertension: Secondary | ICD-10-CM | POA: Insufficient documentation

## 2013-04-04 DIAGNOSIS — D3130 Benign neoplasm of unspecified choroid: Secondary | ICD-10-CM | POA: Insufficient documentation

## 2013-04-04 DIAGNOSIS — H4310 Vitreous hemorrhage, unspecified eye: Secondary | ICD-10-CM | POA: Insufficient documentation

## 2013-04-04 DIAGNOSIS — E7089 Other disorders of aromatic amino-acid metabolism: Secondary | ICD-10-CM | POA: Insufficient documentation

## 2013-04-04 HISTORY — PX: PARS PLANA VITRECTOMY: SHX2166

## 2013-04-04 HISTORY — PX: GAS/FLUID EXCHANGE: SHX5334

## 2013-04-04 SURGERY — PARS PLANA VITRECTOMY WITH 25 GAUGE
Anesthesia: Monitor Anesthesia Care | Site: Eye | Laterality: Right | Wound class: Clean

## 2013-04-04 MED ORDER — DEXAMETHASONE SODIUM PHOSPHATE 10 MG/ML IJ SOLN
INTRAMUSCULAR | Status: AC
  Filled 2013-04-04: qty 1

## 2013-04-04 MED ORDER — BUPIVACAINE HCL (PF) 0.5 % IJ SOLN: INTRAMUSCULAR | Status: DC | PRN

## 2013-04-04 MED ORDER — LIDOCAINE HCL 2 % IJ SOLN
INTRAMUSCULAR | Status: AC
  Filled 2013-04-04: qty 20

## 2013-04-04 MED ORDER — PHENYLEPHRINE HCL 2.5 % OP SOLN
1.0000 [drp] | OPHTHALMIC | Status: AC | PRN
  Administered 2013-04-04 (×3): 1 [drp] via OPHTHALMIC

## 2013-04-04 MED ORDER — VANCOMYCIN SUBCONJUNCTIVAL INJECTION 25 MG/0.5 ML
50.0000 mg | INTRAOCULAR | Status: AC
  Administered 2013-04-04: 50 mg via SUBCONJUNCTIVAL
  Filled 2013-04-04: qty 1

## 2013-04-04 MED ORDER — SODIUM HYALURONATE 10 MG/ML IO SOLN
INTRAOCULAR | Status: AC
Start: 2013-04-04 — End: 2013-04-04
  Filled 2013-04-04: qty 0.85

## 2013-04-04 MED ORDER — SODIUM CHLORIDE 0.9 % IV SOLN: INTRAVENOUS | Status: DC

## 2013-04-04 MED ORDER — HYPROMELLOSE (GONIOSCOPIC) 2.5 % OP SOLN
OPHTHALMIC | Status: AC
  Filled 2013-04-04: qty 15

## 2013-04-04 MED ORDER — MIDAZOLAM HCL 5 MG/5ML IJ SOLN
INTRAMUSCULAR | Status: DC | PRN
  Administered 2013-04-04: 2 mg via INTRAVENOUS

## 2013-04-04 MED ORDER — BACITRACIN-POLYMYXIN B 500-10000 UNIT/GM OP OINT
TOPICAL_OINTMENT | OPHTHALMIC | Status: AC
  Filled 2013-04-04: qty 3.5

## 2013-04-04 MED ORDER — BUPIVACAINE HCL (PF) 0.75 % IJ SOLN
INTRAMUSCULAR | Status: AC
  Filled 2013-04-04: qty 10

## 2013-04-04 MED ORDER — GATIFLOXACIN 0.5 % OP SOLN
OPHTHALMIC | Status: AC
  Filled 2013-04-04: qty 2.5

## 2013-04-04 MED ORDER — PHENYLEPHRINE HCL 2.5 % OP SOLN
OPHTHALMIC | Status: AC
  Filled 2013-04-04: qty 2

## 2013-04-04 MED ORDER — HYPROMELLOSE (GONIOSCOPIC) 2.5 % OP SOLN
OPHTHALMIC | Status: DC | PRN
  Administered 2013-04-04: 2 [drp] via OPHTHALMIC

## 2013-04-04 MED ORDER — TETRACAINE HCL 0.5 % OP SOLN
2.0000 [drp] | OPHTHALMIC | Status: AC
  Administered 2013-04-04: 2 [drp] via OPHTHALMIC

## 2013-04-04 MED ORDER — GATIFLOXACIN 0.5 % OP SOLN
1.0000 [drp] | OPHTHALMIC | Status: AC | PRN
  Administered 2013-04-04 (×3): 1 [drp] via OPHTHALMIC

## 2013-04-04 MED ORDER — 0.9 % SODIUM CHLORIDE (POUR BTL) OPTIME
TOPICAL | Status: DC | PRN
  Administered 2013-04-04: 1000 mL

## 2013-04-04 MED ORDER — PREDNISOLONE ACETATE 1 % OP SUSP
OPHTHALMIC | Status: AC
  Filled 2013-04-04: qty 5

## 2013-04-04 MED ORDER — NA CHONDROIT SULF-NA HYALURON 40-30 MG/ML IO SOLN
INTRAOCULAR | Status: AC
  Filled 2013-04-04: qty 0.5

## 2013-04-04 MED ORDER — TRIAMCINOLONE ACETONIDE 40 MG/ML IJ SUSP
INTRAMUSCULAR | Status: AC
  Filled 2013-04-04: qty 5

## 2013-04-04 MED ORDER — SODIUM CHLORIDE 0.9 % IV SOLN
INTRAVENOUS | Status: DC | PRN
  Administered 2013-04-04: 10:00:00 via INTRAVENOUS

## 2013-04-04 MED ORDER — EPINEPHRINE HCL 1 MG/ML IJ SOLN
INTRAMUSCULAR | Status: AC
  Filled 2013-04-04: qty 1

## 2013-04-04 MED ORDER — ACETAZOLAMIDE SODIUM 500 MG IJ SOLR
INTRAMUSCULAR | Status: AC
  Filled 2013-04-04: qty 500

## 2013-04-04 MED ORDER — DEXAMETHASONE SODIUM PHOSPHATE 10 MG/ML IJ SOLN
INTRAMUSCULAR | Status: DC | PRN
  Administered 2013-04-04: 10 mg

## 2013-04-04 MED ORDER — TETRACAINE HCL 0.5 % OP SOLN
OPHTHALMIC | Status: AC
  Filled 2013-04-04: qty 2

## 2013-04-04 MED ORDER — PREDNISOLONE ACETATE 1 % OP SUSP
1.0000 [drp] | OPHTHALMIC | Status: AC
  Administered 2013-04-04: 1 [drp] via OPHTHALMIC

## 2013-04-04 MED ORDER — FENTANYL CITRATE 0.05 MG/ML IJ SOLN
INTRAMUSCULAR | Status: DC | PRN
  Administered 2013-04-04 (×2): 25 ug via INTRAVENOUS
  Administered 2013-04-04: 50 ug via INTRAVENOUS
  Administered 2013-04-04: 25 ug via INTRAVENOUS

## 2013-04-04 MED ORDER — EPINEPHRINE HCL 1 MG/ML IJ SOLN
INTRAOCULAR | Status: DC | PRN
  Administered 2013-04-04: 11:00:00

## 2013-04-04 MED ORDER — PROPOFOL 10 MG/ML IV BOLUS
INTRAVENOUS | Status: DC | PRN
  Administered 2013-04-04: 50 mg via INTRAVENOUS

## 2013-04-04 MED ORDER — BACITRACIN-POLYMYXIN B 500-10000 UNIT/GM OP OINT
TOPICAL_OINTMENT | OPHTHALMIC | Status: DC | PRN
  Administered 2013-04-04: 1 via OPHTHALMIC

## 2013-04-04 MED ORDER — LIDOCAINE HCL 2 % IJ SOLN
INTRAMUSCULAR | Status: DC | PRN
  Administered 2013-04-04: 11:00:00 via RETROBULBAR

## 2013-04-04 SURGICAL SUPPLY — 43 items
APPLICATOR COTTON TIP 6IN STRL (MISCELLANEOUS) ×2 IMPLANT
CANNULA FLEX TIP 23G (CANNULA) IMPLANT
COVER MAYO STAND STRL (DRAPES) ×2 IMPLANT
DRAPE OPHTHALMIC 77X100 STRL (CUSTOM PROCEDURE TRAY) ×2 IMPLANT
DRAPE POUCH INSTRU U-SHP 10X18 (DRAPES) ×2 IMPLANT
DRSG TEGADERM 4X4.75 (GAUZE/BANDAGES/DRESSINGS) ×2 IMPLANT
GAS AUTO FILL CONSTEL (OPHTHALMIC) ×2
GAS AUTO FILL CONSTELLATION (OPHTHALMIC) ×1 IMPLANT
GAS OPHTHALMIC (MISCELLANEOUS) ×2 IMPLANT
GLOVE ECLIPSE 7.0 STRL STRAW (GLOVE) ×4 IMPLANT
GLOVE SS BIOGEL STRL SZ 6.5 (GLOVE) ×1 IMPLANT
GLOVE SUPERSENSE BIOGEL SZ 6.5 (GLOVE) ×1
GOWN EXTRA PROTECTION XL (GOWNS) ×2 IMPLANT
GOWN STRL NON-REIN LRG LVL3 (GOWN DISPOSABLE) ×2 IMPLANT
ILLUMINATOR ENDO 25GA (MISCELLANEOUS) ×2 IMPLANT
ILLUMINATOR WIDEFIELD DIFF (MISCELLANEOUS) ×2 IMPLANT
KIT ROOM TURNOVER OR (KITS) ×2 IMPLANT
KNIFE GRIESHABER SHARP 2.5MM (MISCELLANEOUS) ×2 IMPLANT
MARKER SKIN DUAL TIP RULER LAB (MISCELLANEOUS) ×2 IMPLANT
MASK EYE SHIELD (GAUZE/BANDAGES/DRESSINGS) ×2 IMPLANT
NEEDLE 18GX1X1/2 (RX/OR ONLY) (NEEDLE) ×2 IMPLANT
NEEDLE 22X1 1/2 (OR ONLY) (NEEDLE) ×2 IMPLANT
NEEDLE 25GX 5/8IN NON SAFETY (NEEDLE) ×2 IMPLANT
NEEDLE HYPO 22GX1.5 SAFETY (NEEDLE) IMPLANT
NEEDLE HYPO 30X.5 LL (NEEDLE) ×4 IMPLANT
NS IRRIG 1000ML POUR BTL (IV SOLUTION) ×2 IMPLANT
PACK VITRECTOMY CUSTOM (CUSTOM PROCEDURE TRAY) ×2 IMPLANT
PACK VITRECTOMY PIC MCHSVP (PACKS) ×2 IMPLANT
PAD ARMBOARD 7.5X6 YLW CONV (MISCELLANEOUS) ×4 IMPLANT
PAD EYE OVAL STERILE LF (GAUZE/BANDAGES/DRESSINGS) ×2 IMPLANT
PAK VITRECTOMY PIK  23GA (OPHTHALMIC RELATED) ×2 IMPLANT
PROBE DIRECTIONAL LASER (MISCELLANEOUS) IMPLANT
SCRAPER DIAMOND 25GA (OPHTHALMIC RELATED) IMPLANT
SET FLUID INJECTOR (SET/KITS/TRAYS/PACK) IMPLANT
SOLUTION ANTI FOG 6CC (MISCELLANEOUS) ×2 IMPLANT
SPEAR EYE SURG WECK-CEL (MISCELLANEOUS) ×6 IMPLANT
SUT PLAIN 6 0 TG1408 (SUTURE) ×2 IMPLANT
SYR TB 1ML LUER SLIP (SYRINGE) ×2 IMPLANT
SYRINGE 10CC LL (SYRINGE) IMPLANT
TAPE PAPER MEDFIX 1IN X 10YD (GAUZE/BANDAGES/DRESSINGS) ×2 IMPLANT
TOWEL OR 17X24 6PK STRL BLUE (TOWEL DISPOSABLE) ×4 IMPLANT
WATER STERILE IRR 1000ML POUR (IV SOLUTION) ×2 IMPLANT
WIPE INSTRUMENT VISIWIPE 73X73 (MISCELLANEOUS) ×2 IMPLANT

## 2013-04-04 NOTE — Anesthesia Preprocedure Evaluation (Addendum)
Anesthesia Evaluation  Patient identified by MRN, date of birth, ID band Patient awake    Reviewed: Allergy & Precautions, H&P , NPO status , Patient's Chart, lab work & pertinent test results  History of Anesthesia Complications Negative for: history of anesthetic complications  Airway       Dental   Pulmonary neg pulmonary ROS, former smoker,          Cardiovascular hypertension, Pt. on medications     Neuro/Psych PSYCHIATRIC DISORDERS Anxiety TIA   GI/Hepatic Neg liver ROS, GERD-  Controlled,  Endo/Other  Morbid obesity  Renal/GU negative Renal ROS     Musculoskeletal   Abdominal   Peds  Hematology   Anesthesia Other Findings   Reproductive/Obstetrics                          Anesthesia Physical Anesthesia Plan  ASA: III  Anesthesia Plan: MAC   Post-op Pain Management:    Induction:   Airway Management Planned: Nasal Cannula  Additional Equipment:   Intra-op Plan:   Post-operative Plan:   Informed Consent:   Plan Discussed with: CRNA and Anesthesiologist  Anesthesia Plan Comments:         Anesthesia Quick Evaluation

## 2013-04-04 NOTE — Transfer of Care (Signed)
Immediate Anesthesia Transfer of Care Note  Patient: Sheila Hartman  Procedure(s) Performed: Procedure(s) with comments: PARS PLANA VITRECTOMY WITH 25 GAUGE (Right) GAS/FLUID EXCHANGE (Right) - C3F8  Patient Location: PACU  Anesthesia Type:MAC  Level of Consciousness: awake, alert  and oriented  Airway & Oxygen Therapy: Patient Spontanous Breathing  Post-op Assessment: Report given to PACU RN and Post -op Vital signs reviewed and stable  Post vital signs: Reviewed and stable  Complications: No apparent anesthesia complications

## 2013-04-04 NOTE — Op Note (Signed)
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  A solid lid speculum was placed. The conjunctiva was displaced with a cotton tipped applicator at the  7:30  meridian. A trocar/cannula was placed 3.5 mm from the surgical limbus. The cannula was visualized in the vitreous cavity. The infusion line was allowed to run and then clamped when placed at the cannula opening. The line was inserted and secured to the drape with an adhesive strip. Trocar/Cannulas were then placed at the 9:30 and 2:30 meridian. The light pipe and vitreous cutter were inserted into the vitreous cavity and the wide field lens was placed. The patient had resorbing vitreous hemorrhage which was caused by a large horssshoe tear at 2:30 and a smaller tear along lattive at 4:30. The vitreous was detached. Core vitrectomy was carried out uneventfully with care taken to remove the vitreous up to the vitreous base for 360 degrees.  Total air fluid exchange was then carried out re-attaching the retina. Focal Laser was applied surrounging both breaks with a wide margin given her pale/blond fundus. Focal Laser was also applied around all lattice lesions, some of which were quite posterior. C3F8 gas was mixed by the surgeon at 15% concentration. The gas was then passed through the eye for a total volume of 50cc using a canula for egress at 2:30.   The cannulas were removed from the 9:30 and 2:30 positions with concommitant tamponade using a cotton tipped applicator. Subconjunctival injections of Ancef 100mg /0.38ml and Dexamethasone 4mg /36ml  were placed in the infero-medial quadrant to avoid proximity to the cannula sites.   The infusion cannula was removed with concomitant tamponade with the cotton tipped applicator leaving the ocular pressure less than 10 by palpation.  The speculum and drapes were removed and the eye was patched with Polymixin/Bacitracin ophthalmic ointment. An eye shield was placed and the patient  was transferred alert and conversant with stable vital signs to the post operative recovery area.  Shade Flood MD

## 2013-04-04 NOTE — Preoperative (Signed)
Beta Blockers   Reason not to administer Beta Blockers:Not Applicable 

## 2013-04-04 NOTE — Interval H&P Note (Signed)
History and Physical Interval Note:  04/04/2013 10:09 AM  Sheila Hartman  has presented today for surgery, with the diagnosis of Vitreous Hemorrhage OD  The various methods of treatment have been discussed with the patient and family. After consideration of risks, benefits and other options for treatment, the patient has consented to  Procedure(s): PARS PLANA VITRECTOMY WITH 25 GAUGE (Right) as a surgical intervention .  The patient's history has been reviewed, patient examined, no change in status, stable for surgery.  I have reviewed the patient's chart and labs.  Questions were answered to the patient's satisfaction.     Mashelle Busick, Waynette Buttery

## 2013-04-04 NOTE — Anesthesia Postprocedure Evaluation (Signed)
Anesthesia Post Note  Patient: Sheila Hartman  Procedure(s) Performed: Procedure(s) (LRB): PARS PLANA VITRECTOMY WITH 25 GAUGE (Right) GAS/FLUID EXCHANGE (Right)  Anesthesia type: MAC  Patient location: PACU  Post pain: Pain level controlled  Post assessment: Patient's Cardiovascular Status Stable  Last Vitals:  Filed Vitals:   04/04/13 1135  BP: 144/69  Pulse: 74  Temp: 37.6 C  Resp: 16    Post vital signs: Reviewed and stable  Level of consciousness: sedated  Complications: No apparent anesthesia complications

## 2013-04-06 ENCOUNTER — Encounter (HOSPITAL_COMMUNITY): Payer: Self-pay | Admitting: Ophthalmology

## 2013-05-04 ENCOUNTER — Other Ambulatory Visit: Payer: Self-pay | Admitting: Ophthalmology

## 2013-05-04 NOTE — H&P (Signed)
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 She notes improvement. She can see some shapes and shadows. She sees the bubble in her inferior VF. History of Present Illness:   She has no other known eye problems. Past History:  Allergies:  Penicillin, hives, Active Medications:   Other Medications:  Lotril 1 po QD, 81 ASA QD Birth History:  none Past Ocular History:   Nystagmus Past Medical History:   Hypertension Arthritis Past Surgical History:   Hysterectomy 1997 Hip repalcement right side. Left knee arthroscopy Family History: , Sister has Nystagmus as well, a cousin has nystragmus in one eye All other family history is negative. Social History:   Smoking Status: former smoker  Alcohol:  none  Review of Systems:   Eyes: + decreased vision  All other systems are negative.  Examination:  Visual Acuity:   [facility] [place]:  OD: 20/400    OS: 20/200 ph 20/80 IOP:  OD:  14    @ 04:40PM (Goldmann applanation)  Confrontation visual field:  OU:  Normal  Motility:  nystagmus  Pupils:  OU:  Shape, size, direct and consensual reaction normal  Adnexa:  Preauricular LN, lacrimal drainage, lacrimal glands, orbit normal  Eyelids:  Eyelids:  normal Conjunctiva:  OU:  bulbar, palpebral normal  Cornea:  OU:  epithelium, stroma, endothelium, tear film normal  Anterior Chamber:  OU:  depth normal, no cell, no flare, 3+ deep / clear  Iris:  OU:  normal Dilation:  OU: Tropicamide 1%/ N 2.5% @ 03:11PM  Lens:  OU:  1+ nuclear sclerotic cataract  Vitreous:  OD:  30% C3F8[15] gas fill   OS:  normal  Optic Disc:  OD:  cupping: no view   OS:  cupping: 0.1 , small, tilted  Macula: OS:   normal  Vessels: OS:  normal  Periphery: OS:  normal, inferior rd    Impression:  361.02  Retinal Detachment, partial, multiple defect OD: Macula Off 362.63  -Lattice Degeneration OD 379.23  -Vitreous Hemorrhage OD -  --post PPV, FGX, EL OD x 7/172014 224.60  Choroidal Nevus OD  Plan/Treatment:  I discussed the finding of a recurrent RD on today's exam. The exact retinal defect is not identified. I have recommended repeat vitrectomy, endolaser combined with scleral buckle OD. I will try to schedule for 05/08/2013. We discussed risk of surgery inclusive of recurrence , infection, vision loss. 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 I have advised against that as I wish to minimize risk of PVR given the extended period with vitreous hemorrhage.  She indicated understanding our discussion and felt that her questions had been answered to her satisfaction.  She indicates that she desires to proceed with the recommended treatment/care plan.   Medications:   Continue Prednisolone Acetate 1 gtt affected eye 1 gtt OD QID  D/C Zymaxid 1 gtt affected eye QID  Patient Instructions: Please do not eat anything after mignight the day before surgery. Return to clinic:  05/09/2013, 5:30 PM. for post-operative follow-up  Schedule:  Pars Plana, Vitrectomy, Membrane Peeling, Endolaser, Fluid Gas Exchange, Scelral Buckle Right Eye   (electronically   signed) Cyrene Gharibian, MD   

## 2013-05-07 ENCOUNTER — Encounter (HOSPITAL_COMMUNITY): Payer: Self-pay | Admitting: Ophthalmology

## 2013-05-07 MED ORDER — GATIFLOXACIN 0.5 % OP SOLN
1.0000 [drp] | OPHTHALMIC | Status: AC | PRN
  Administered 2013-05-08 (×3): 1 [drp] via OPHTHALMIC
  Filled 2013-05-07: qty 2.5

## 2013-05-07 MED ORDER — TETRACAINE HCL 0.5 % OP SOLN: 2.0000 [drp] | OPHTHALMIC | Status: DC

## 2013-05-07 MED ORDER — PHENYLEPHRINE HCL 2.5 % OP SOLN
1.0000 [drp] | OPHTHALMIC | Status: AC | PRN
  Administered 2013-05-08 (×3): 1 [drp] via OPHTHALMIC
  Filled 2013-05-07: qty 2

## 2013-05-07 MED ORDER — PREDNISOLONE ACETATE 1 % OP SUSP: 1.0000 [drp] | OPHTHALMIC | Status: DC

## 2013-05-07 NOTE — Progress Notes (Signed)
Pt denies SOB, chest pain, and being under the care of a cardiologist. Pt made aware to Stop taking Aspirin and herbal medications. Do not take any NSAIDs ie: Ibuprofen, Advil, Naproxen or any medication containing Aspirin. 

## 2013-05-07 NOTE — Progress Notes (Signed)
Pt denies fever, cold, and chills but C/O having a "stuffy nose."

## 2013-05-08 ENCOUNTER — Encounter (HOSPITAL_COMMUNITY): Payer: Self-pay | Admitting: *Deleted

## 2013-05-08 ENCOUNTER — Telehealth (HOSPITAL_COMMUNITY): Payer: Self-pay | Admitting: Emergency Medicine

## 2013-05-08 ENCOUNTER — Ambulatory Visit (HOSPITAL_COMMUNITY)
Admission: RE | Admit: 2013-05-08 | Discharge: 2013-05-08 | Disposition: A | Discharge status: Home / Self Care | Payer: Medicaid Other | Source: Ambulatory Visit | Attending: Ophthalmology | Admitting: Ophthalmology

## 2013-05-08 ENCOUNTER — Encounter (HOSPITAL_COMMUNITY)
Admission: RE | Disposition: A | Discharge status: Home / Self Care | Payer: Self-pay | Source: Ambulatory Visit | Attending: Ophthalmology

## 2013-05-08 ENCOUNTER — Ambulatory Visit (HOSPITAL_COMMUNITY): Payer: Medicaid Other | Admitting: Anesthesiology

## 2013-05-08 ENCOUNTER — Encounter (HOSPITAL_COMMUNITY): Payer: Self-pay | Admitting: Anesthesiology

## 2013-05-08 DIAGNOSIS — Z87891 Personal history of nicotine dependence: Secondary | ICD-10-CM | POA: Insufficient documentation

## 2013-05-08 DIAGNOSIS — Z79899 Other long term (current) drug therapy: Secondary | ICD-10-CM | POA: Insufficient documentation

## 2013-05-08 DIAGNOSIS — H4310 Vitreous hemorrhage, unspecified eye: Secondary | ICD-10-CM | POA: Insufficient documentation

## 2013-05-08 DIAGNOSIS — M129 Arthropathy, unspecified: Secondary | ICD-10-CM | POA: Insufficient documentation

## 2013-05-08 DIAGNOSIS — K219 Gastro-esophageal reflux disease without esophagitis: Secondary | ICD-10-CM | POA: Insufficient documentation

## 2013-05-08 DIAGNOSIS — H2510 Age-related nuclear cataract, unspecified eye: Secondary | ICD-10-CM | POA: Insufficient documentation

## 2013-05-08 DIAGNOSIS — H33029 Retinal detachment with multiple breaks, unspecified eye: Secondary | ICD-10-CM | POA: Insufficient documentation

## 2013-05-08 DIAGNOSIS — H35419 Lattice degeneration of retina, unspecified eye: Secondary | ICD-10-CM | POA: Insufficient documentation

## 2013-05-08 DIAGNOSIS — I10 Essential (primary) hypertension: Secondary | ICD-10-CM | POA: Insufficient documentation

## 2013-05-08 DIAGNOSIS — H5500 Unspecified nystagmus: Secondary | ICD-10-CM | POA: Insufficient documentation

## 2013-05-08 DIAGNOSIS — F411 Generalized anxiety disorder: Secondary | ICD-10-CM | POA: Insufficient documentation

## 2013-05-08 HISTORY — DX: Nausea with vomiting, unspecified: R11.2

## 2013-05-08 HISTORY — DX: Other complications of anesthesia, initial encounter: T88.59XA

## 2013-05-08 HISTORY — PX: PHOTOCOAGULATION WITH LASER: SHX6027

## 2013-05-08 HISTORY — DX: Headache: R51

## 2013-05-08 HISTORY — PX: VITRECTOMY 23 GAUGE WITH SCLERAL BUCKLE: SHX6181

## 2013-05-08 HISTORY — DX: Anemia, unspecified: D64.9

## 2013-05-08 HISTORY — PX: GAS/FLUID EXCHANGE: SHX5334

## 2013-05-08 HISTORY — DX: Other specified postprocedural states: Z98.890

## 2013-05-08 HISTORY — DX: Adverse effect of unspecified anesthetic, initial encounter: T41.45XA

## 2013-05-08 LAB — BASIC METABOLIC PANEL
CO2: 26 mEq/L (ref 19–32)
Calcium: 9.8 mg/dL (ref 8.4–10.5)
Chloride: 105 mEq/L (ref 96–112)
Glucose, Bld: 117 mg/dL — ABNORMAL HIGH (ref 70–99)
Potassium: 3.1 mEq/L — ABNORMAL LOW (ref 3.5–5.1)
Sodium: 142 mEq/L (ref 135–145)

## 2013-05-08 LAB — CBC
Hemoglobin: 12.5 g/dL (ref 12.0–15.0)
MCH: 23.8 pg — ABNORMAL LOW (ref 26.0–34.0)
Platelets: 303 10*3/uL (ref 150–400)
RBC: 5.26 MIL/uL — ABNORMAL HIGH (ref 3.87–5.11)
WBC: 9.8 10*3/uL (ref 4.0–10.5)

## 2013-05-08 SURGERY — VITRECTOMY, USING 23-GAUGE INSTRUMENTS, WITH SCLERAL BUCKLING
Anesthesia: General | Site: Eye | Laterality: Right | Wound class: Clean

## 2013-05-08 MED ORDER — TRIAMCINOLONE ACETONIDE 40 MG/ML IJ SUSP
INTRAMUSCULAR | Status: AC
  Filled 2013-05-08: qty 5

## 2013-05-08 MED ORDER — ROCURONIUM BROMIDE 100 MG/10ML IV SOLN
INTRAVENOUS | Status: DC | PRN
  Administered 2013-05-08: 50 mg via INTRAVENOUS

## 2013-05-08 MED ORDER — MIDAZOLAM HCL 5 MG/5ML IJ SOLN
INTRAMUSCULAR | Status: DC | PRN
  Administered 2013-05-08: 2 mg via INTRAVENOUS

## 2013-05-08 MED ORDER — TRIAMCINOLONE ACETONIDE 40 MG/ML IJ SUSP
INTRAMUSCULAR | Status: DC | PRN
  Administered 2013-05-08: 40 mg

## 2013-05-08 MED ORDER — ONDANSETRON HCL 4 MG/2ML IJ SOLN
INTRAMUSCULAR | Status: DC | PRN
  Administered 2013-05-08: 4 mg via INTRAVENOUS

## 2013-05-08 MED ORDER — PREDNISOLONE ACETATE 1 % OP SUSP
1.0000 [drp] | OPHTHALMIC | Status: AC
  Administered 2013-05-08: 1 [drp] via OPHTHALMIC
  Filled 2013-05-08: qty 5

## 2013-05-08 MED ORDER — DEXAMETHASONE SODIUM PHOSPHATE 10 MG/ML IJ SOLN
INTRAMUSCULAR | Status: DC | PRN
  Administered 2013-05-08: 8 mg via INTRAVENOUS

## 2013-05-08 MED ORDER — DEXAMETHASONE SODIUM PHOSPHATE 10 MG/ML IJ SOLN
INTRAMUSCULAR | Status: AC
  Filled 2013-05-08: qty 1

## 2013-05-08 MED ORDER — HYPROMELLOSE (GONIOSCOPIC) 2.5 % OP SOLN
OPHTHALMIC | Status: DC | PRN
  Administered 2013-05-08: 2 [drp] via OPHTHALMIC

## 2013-05-08 MED ORDER — TETRACAINE HCL 0.5 % OP SOLN
OPHTHALMIC | Status: AC
  Filled 2013-05-08: qty 2

## 2013-05-08 MED ORDER — SODIUM HYALURONATE 10 MG/ML IO SOLN
INTRAOCULAR | Status: DC | PRN
  Administered 2013-05-08: 0.85 mL via INTRAOCULAR

## 2013-05-08 MED ORDER — HYPROMELLOSE (GONIOSCOPIC) 2.5 % OP SOLN
OPHTHALMIC | Status: AC
  Filled 2013-05-08: qty 15

## 2013-05-08 MED ORDER — EPINEPHRINE HCL 1 MG/ML IJ SOLN
INTRAMUSCULAR | Status: AC
  Filled 2013-05-08: qty 1

## 2013-05-08 MED ORDER — LIDOCAINE HCL (CARDIAC) 20 MG/ML IV SOLN
INTRAVENOUS | Status: DC | PRN
  Administered 2013-05-08: 100 mg via INTRAVENOUS

## 2013-05-08 MED ORDER — ACETAZOLAMIDE SODIUM 500 MG IJ SOLR
INTRAMUSCULAR | Status: AC
  Filled 2013-05-08: qty 500

## 2013-05-08 MED ORDER — BUPIVACAINE HCL (PF) 0.75 % IJ SOLN
INTRAMUSCULAR | Status: DC | PRN
  Administered 2013-05-08: 10 mL

## 2013-05-08 MED ORDER — VANCOMYCIN SUBCONJUNCTIVAL INJECTION 25 MG/0.5 ML
INTRAOCULAR | Status: DC | PRN
  Administered 2013-05-08: 50 mg via SUBCONJUNCTIVAL

## 2013-05-08 MED ORDER — BSS PLUS IO SOLN
INTRAOCULAR | Status: AC
  Filled 2013-05-08: qty 500

## 2013-05-08 MED ORDER — NA CHONDROIT SULF-NA HYALURON 40-30 MG/ML IO SOLN
INTRAOCULAR | Status: AC
  Filled 2013-05-08: qty 0.5

## 2013-05-08 MED ORDER — BUPIVACAINE HCL (PF) 0.75 % IJ SOLN
INTRAMUSCULAR | Status: AC
  Filled 2013-05-08: qty 10

## 2013-05-08 MED ORDER — HYDROMORPHONE HCL PF 1 MG/ML IJ SOLN
INTRAMUSCULAR | Status: DC | PRN
  Administered 2013-05-08 (×2): 0.5 mg via INTRAVENOUS

## 2013-05-08 MED ORDER — TETRACAINE HCL 0.5 % OP SOLN
2.0000 [drp] | OPHTHALMIC | Status: AC
  Administered 2013-05-08: 2 [drp] via OPHTHALMIC
  Filled 2013-05-08: qty 2

## 2013-05-08 MED ORDER — PROPOFOL 10 MG/ML IV BOLUS
INTRAVENOUS | Status: DC | PRN
  Administered 2013-05-08: 200 mg via INTRAVENOUS

## 2013-05-08 MED ORDER — SODIUM CHLORIDE 0.9 % IV SOLN
INTRAVENOUS | Status: DC
  Administered 2013-05-08 (×3): via INTRAVENOUS

## 2013-05-08 MED ORDER — BSS IO SOLN
INTRAOCULAR | Status: AC
  Filled 2013-05-08: qty 15

## 2013-05-08 MED ORDER — FENTANYL CITRATE 0.05 MG/ML IJ SOLN
INTRAMUSCULAR | Status: DC | PRN
  Administered 2013-05-08 (×5): 50 ug via INTRAVENOUS

## 2013-05-08 MED ORDER — EPINEPHRINE HCL 1 MG/ML IJ SOLN
INTRAOCULAR | Status: DC | PRN
  Administered 2013-05-08: 14:00:00

## 2013-05-08 MED ORDER — DEXAMETHASONE SODIUM PHOSPHATE 10 MG/ML IJ SOLN
INTRAMUSCULAR | Status: DC | PRN
  Administered 2013-05-08: 10 mg

## 2013-05-08 MED ORDER — LIDOCAINE HCL 2 % IJ SOLN
INTRAMUSCULAR | Status: AC
  Filled 2013-05-08: qty 20

## 2013-05-08 MED ORDER — ACETAZOLAMIDE SODIUM 500 MG IJ SOLR
INTRAMUSCULAR | Status: DC | PRN
  Administered 2013-05-08: 500 mg via INTRAVENOUS

## 2013-05-08 MED ORDER — SODIUM HYALURONATE 10 MG/ML IO SOLN
INTRAOCULAR | Status: AC
  Filled 2013-05-08: qty 0.85

## 2013-05-08 MED ORDER — BSS IO SOLN
INTRAOCULAR | Status: DC | PRN
  Administered 2013-05-08 (×2): 15 mL via INTRAOCULAR

## 2013-05-08 MED ORDER — VANCOMYCIN SUBCONJUNCTIVAL INJECTION 25 MG/0.5 ML
25.0000 mg | INTRAOCULAR | Status: DC
  Filled 2013-05-08: qty 0.5

## 2013-05-08 MED ORDER — BACITRACIN-POLYMYXIN B 500-10000 UNIT/GM OP OINT
TOPICAL_OINTMENT | OPHTHALMIC | Status: AC
  Filled 2013-05-08: qty 3.5

## 2013-05-08 SURGICAL SUPPLY — 85 items
APL SRG 3 HI ABS STRL LF PLS (MISCELLANEOUS) ×2
APPLICATOR COTTON TIP 6IN STRL (MISCELLANEOUS) ×3 IMPLANT
APPLICATOR DR MATTHEWS STRL (MISCELLANEOUS) ×3 IMPLANT
BAND SCLERAL BUCKLING TYPE 42 (Ophthalmic Related) ×3 IMPLANT
BLADE EYE MINI 60D BEAVER (BLADE) ×3 IMPLANT
BLADE MINI BENT 60 DEGREE (BLADE) IMPLANT
BLADE MVR KNIFE 19G (BLADE) IMPLANT
BLADE MVR KNIFE 20G (BLADE) ×3 IMPLANT
BLADE STAB KNIFE 15DEG (BLADE) IMPLANT
CANNULA DUAL BORE 23G (CANNULA) ×3 IMPLANT
CANNULA INFUSION 4 (MISCELLANEOUS) IMPLANT
CANNULA VLV SOFT TIP 23GA (OPHTHALMIC) IMPLANT
CANNULA VLV SOFT TIP 25GA (OPHTHALMIC) IMPLANT
CLOTH BEACON ORANGE TIMEOUT ST (SAFETY) ×3 IMPLANT
CORDS BIPOLAR (ELECTRODE) ×3 IMPLANT
COVER MAYO STAND STRL (DRAPES) ×3 IMPLANT
DRAPE OPHTHALMIC 77X100 STRL (CUSTOM PROCEDURE TRAY) ×3 IMPLANT
DRAPE POUCH INSTRU U-SHP 10X18 (DRAPES) ×3 IMPLANT
DRSG TEGADERM 4X4.75 (GAUZE/BANDAGES/DRESSINGS) ×3 IMPLANT
ERASER HMR WETFIELD 23G BP (MISCELLANEOUS) ×3 IMPLANT
FILTER BLUE MILLIPORE (MISCELLANEOUS) IMPLANT
FORCEPS GRIESHABER ILM 25G A (INSTRUMENTS) IMPLANT
GAS AUTO FILL CONSTEL (OPHTHALMIC)
GAS AUTO FILL CONSTELLATION (OPHTHALMIC) IMPLANT
GAS OPHTHALMIC (MISCELLANEOUS) IMPLANT
GLOVE BIO SURGEON STRL SZ7.5 (GLOVE) ×3 IMPLANT
GLOVE SS BIOGEL STRL SZ 6.5 (GLOVE) ×2 IMPLANT
GLOVE SUPERSENSE BIOGEL SZ 6.5 (GLOVE) ×1
GLOVE SURG SS PI 6.5 STRL IVOR (GLOVE) ×3 IMPLANT
GOWN EXTRA PROTECTION XL (GOWNS) ×3 IMPLANT
GOWN STRL NON-REIN LRG LVL3 (GOWN DISPOSABLE) ×3 IMPLANT
HANDLE PNEUMATIC FOR CONSTEL (OPHTHALMIC) IMPLANT
ILLUMINATOR WIDEFIELD DIFF (MISCELLANEOUS) IMPLANT
KIT PERFLUORON PROCEDURE 5ML (MISCELLANEOUS) ×3 IMPLANT
KIT ROOM TURNOVER OR (KITS) ×3 IMPLANT
KNIFE CRESCENT 1.75 EDGEAHEAD (BLADE) IMPLANT
KNIFE GRIESHABER SHARP 2.5MM (MISCELLANEOUS) ×3 IMPLANT
MARKER SKIN DUAL TIP RULER LAB (MISCELLANEOUS) IMPLANT
MASK EYE SHIELD (GAUZE/BANDAGES/DRESSINGS) ×3 IMPLANT
NEEDLE 18GX1X1/2 (RX/OR ONLY) (NEEDLE) IMPLANT
NEEDLE 22X1 1/2 (OR ONLY) (NEEDLE) IMPLANT
NEEDLE 25GX 5/8IN NON SAFETY (NEEDLE) ×3 IMPLANT
NEEDLE FILTER BLUNT 18X 1/2SAF (NEEDLE) ×1
NEEDLE FILTER BLUNT 18X1 1/2 (NEEDLE) ×2 IMPLANT
NEEDLE HYPO 22GX1.5 SAFETY (NEEDLE) IMPLANT
NEEDLE HYPO 30X.5 LL (NEEDLE) ×12 IMPLANT
NS IRRIG 1000ML POUR BTL (IV SOLUTION) ×3 IMPLANT
OIL SILICONE OPHTHALMIC ADAPTO (Ophthalmic Related) ×3 IMPLANT
PACK FRAGMATOME (OPHTHALMIC) ×3 IMPLANT
PACK VITRECTOMY CUSTOM (CUSTOM PROCEDURE TRAY) ×3 IMPLANT
PACK VITRECTOMY PIC MCHSVP (PACKS) IMPLANT
PAD ARMBOARD 7.5X6 YLW CONV (MISCELLANEOUS) ×6 IMPLANT
PAD EYE OVAL STERILE LF (GAUZE/BANDAGES/DRESSINGS) ×3 IMPLANT
PAK VITRECTOMY PIK  23GA (OPHTHALMIC RELATED) ×3 IMPLANT
PENCIL BIPOLAR 25GA STR DISP (OPHTHALMIC RELATED) ×3 IMPLANT
PROBE LASER ILLUM FLEX CVD 25G (OPHTHALMIC) IMPLANT
ROLLS DENTAL (MISCELLANEOUS) ×6 IMPLANT
SCISSORS TIP ADVANCED DSP 25GA (INSTRUMENTS) IMPLANT
SCRAPER DIAMOND 25GA (OPHTHALMIC RELATED) IMPLANT
SCRAPER DIAMOND DUST MEMBRANE (MISCELLANEOUS) ×3 IMPLANT
SET INJECTOR OIL FLUID CONSTEL (OPHTHALMIC) ×3 IMPLANT
SET VGFI TUBING 8065808002 (SET/KITS/TRAYS/PACK) IMPLANT
SOLUTION ANTI FOG 6CC (MISCELLANEOUS) IMPLANT
SPEAR EYE SURG WECK-CEL (MISCELLANEOUS) ×9 IMPLANT
STRIP CLOSURE SKIN 1/2X4 (GAUZE/BANDAGES/DRESSINGS) ×3 IMPLANT
SUT ETHILON 10 0 CS140 6 (SUTURE) IMPLANT
SUT ETHILON 4 0 P 3 18 (SUTURE) ×3 IMPLANT
SUT ETHILON 5 0 P 3 18 (SUTURE)
SUT ETHILON 9 0 TG140 8 (SUTURE) ×3 IMPLANT
SUT NYLON ETHILON 5-0 P-3 1X18 (SUTURE) IMPLANT
SUT PLAIN 6 0 TG1408 (SUTURE) ×3 IMPLANT
SUT POLY NON ABSORB 10-0 8 STR (SUTURE) IMPLANT
SUT SILK 2 0 (SUTURE) ×3
SUT SILK 2-0 18XBRD TIE 12 (SUTURE) ×2 IMPLANT
SUT VICRYL 7 0 TG140 8 (SUTURE) IMPLANT
SUT VICRYL ABS 6-0 S29 18IN (SUTURE) ×3 IMPLANT
SYR 20CC LL (SYRINGE) IMPLANT
SYR 5ML LL (SYRINGE) IMPLANT
SYR TB 1ML LUER SLIP (SYRINGE) ×3 IMPLANT
SYRINGE 10CC LL (SYRINGE) IMPLANT
TOWEL OR 17X24 6PK STRL BLUE (TOWEL DISPOSABLE) ×9 IMPLANT
TUBE EXTENSION HAMMER (TUBING) ×3 IMPLANT
WATER STERILE IRR 1000ML POUR (IV SOLUTION) ×3 IMPLANT
WIPE INSTRUMENT ADHESIVE BACK (MISCELLANEOUS) ×3 IMPLANT
WIPE INSTRUMENT VISIWIPE 73X73 (MISCELLANEOUS) ×3 IMPLANT

## 2013-05-08 NOTE — Interval H&P Note (Signed)
History and Physical Interval Note:  05/08/2013 1:33 PM  Sheila Hartman  has presented today for surgery, with the diagnosis of Retinal Detachment  Right Eye  The various methods of treatment have been discussed with the patient and family. After consideration of risks, benefits and other options for treatment, the patient has consented to  Procedure(s): SCLERAL BUCKLE (Right) PARS PLANA VITRECTOMY WITH 25 GAUGE (Right) as a surgical intervention .  The patient's history has been reviewed, patient examined, no change in status, stable for surgery.  I have reviewed the patient's chart and labs.  Questions were answered to the patient's satisfaction.     Dustan Hyams, Waynette Buttery

## 2013-05-08 NOTE — Transfer of Care (Signed)
Immediate Anesthesia Transfer of Care Note  Patient: Sheila Hartman  Procedure(s) Performed: Procedure(s) with comments: PARS PLANA VITRECTOMY 23 GAUGE WITH SCLERAL BUCKLE (Right) PHOTOCOAGULATION WITH LASER (Right) - ENDOLASER GAS/FLUID EXCHANGE (Right)  Patient Location: PACU  Anesthesia Type:General  Level of Consciousness: awake, alert  and oriented  Airway & Oxygen Therapy: Patient Spontanous Breathing and Patient connected to nasal cannula oxygen  Post-op Assessment: Report given to PACU RN and Post -op Vital signs reviewed and stable  Post vital signs: Reviewed and stable  Complications: No apparent anesthesia complications

## 2013-05-08 NOTE — H&P (View-Only) (Signed)
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 She notes improvement. She can see some shapes and shadows. She sees the bubble in her inferior VF. History of Present Illness:   She has no other known eye problems. Past History:  Allergies:  Penicillin, hives, Active Medications:   Other Medications:  Lotril 1 po QD, 81 ASA QD Birth History:  none Past Ocular History:   Nystagmus Past Medical History:   Hypertension Arthritis Past Surgical History:   Hysterectomy 1997 Hip repalcement right side. Left knee arthroscopy Family History: , Sister has Nystagmus as well, a cousin has nystragmus in one eye All other family history is negative. Social History:   Smoking Status: former smoker  Alcohol:  none  Review of Systems:   Eyes: + decreased vision  All other systems are negative.  Examination:  Visual Acuity:   [facility] [place]:  OD: 20/400    OS: 20/200 ph 20/80 IOP:  OD:  14    @ 04:40PM (Goldmann applanation)  Confrontation visual field:  OU:  Normal  Motility:  nystagmus  Pupils:  OU:  Shape, size, direct and consensual reaction normal  Adnexa:  Preauricular LN, lacrimal drainage, lacrimal glands, orbit normal  Eyelids:  Eyelids:  normal Conjunctiva:  OU:  bulbar, palpebral normal  Cornea:  OU:  epithelium, stroma, endothelium, tear film normal  Anterior Chamber:  OU:  depth normal, no cell, no flare, 3+ deep / clear  Iris:  OU:  normal Dilation:  OU: Tropicamide 1%/ N 2.5% @ 03:11PM  Lens:  OU:  1+ nuclear sclerotic cataract  Vitreous:  OD:  30% C3F8[15] gas fill   OS:  normal  Optic Disc:  OD:  cupping: no view   OS:  cupping: 0.1 , small, tilted  Macula: OS:   normal  Vessels: OS:  normal  Periphery: OS:  normal, inferior rd    Impression:  361.02  Retinal Detachment, partial, multiple defect OD: Macula Off 362.63  -Lattice Degeneration OD 379.23  -Vitreous Hemorrhage OD -  --post PPV, FGX, EL OD x 7/172014 224.60  Choroidal Nevus OD  Plan/Treatment:  I discussed the finding of a recurrent RD on today's exam. The exact retinal defect is not identified. I have recommended repeat vitrectomy, endolaser combined with scleral buckle OD. I will try to schedule for 05/08/2013. We discussed risk of surgery inclusive of recurrence , infection, vision loss. 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 I have advised against that as I wish to minimize risk of PVR given the extended period with vitreous hemorrhage.  She indicated understanding our discussion and felt that her questions had been answered to her satisfaction.  She indicates that she desires to proceed with the recommended treatment/care plan.   Medications:   Continue Prednisolone Acetate 1 gtt affected eye 1 gtt OD QID  D/C Zymaxid 1 gtt affected eye QID  Patient Instructions: Please do not eat anything after mignight the day before surgery. Return to clinic:  05/09/2013, 5:30 PM. for post-operative follow-up  Schedule:  Pars Plana, Vitrectomy, Membrane Peeling, Endolaser, Fluid Gas Exchange, Scelral Buckle Right Eye   (electronically  signed) Shade Flood, MD

## 2013-05-08 NOTE — Op Note (Signed)
Sheila Hartman 05/08/2013 Diagnosis: Proliferative Vitreoretinopathy, Tractional Retina Detachment Right Eye  Procedure: Pars Plana Vitrectomy, Membrane Peeling, Endolaser, Fluid Gas Exchange, Silicone Oil and Scleral Buckle, Pars Plana Lensectomy Operative Eye:  right eye  Surgeon: Shade Flood Estimated Blood Loss: minimal Specimens for Pathology:  None Complications: none   The  patient was prepped and draped in the usual fashion for ocular surgery on the  right eye .  A solid lid speculum was placed.  Conjunctival peritomies were placed at the 9:30 and 2:30 meridians and the conjunctiva was severed from the corneal limbus for 360 degrees. Blunt dissection of the intermuscular septa was achieved with the tenotomy scissors. A cotton tipped applicator was used to strip tenon's from each rectus muscle. The Graeffe muscle hook was used to isolate each rectus muscule. The Gass threaded muscle hook was then used to place a 2-0 silk ligature around each rectus muscle. The sclera was cleaned and cauterized. The cautery was used to demarcate scleral marks for creating scleral belk loops at the equator in each quadrant. The belt loops were located that posteriorly as the patient had posteriorly located areas of lattice which I wished to support with the buckle. A Grieshaber 0.1 blade was then used to place radial groves in the sclera to facilitate dissection of the belt loops in each quadrant. The [place] 6600 blade was used to dissect the tunnel through the sclera. The Deaver dissector was used to complete each belt loop. The #42 band was used to encircle the eye using the belt loops in each quadrant after placing the buckle element beneath each rectus muscle. The overlap was placed in the superotemporal quadrant. A 2-0 nylon was used as a ligature. A syringe was used to remove vitreous air to lower the IOP.Marland Kitchen The buckle was brought to moderate height and secured with the ligature. The sutures were fixed to  the drape and then the infusion trocar was placed at the 7:30 meridian. The tip was visualized in the vitreous cavity. The trocar was removed and the infusion line attached with the line clamped. Additional trocar cannulas were placed at 9:30 and 2:30. The light pipe was inserted along with the vitreous cutter. The view of the retina was hazy due to a PSC Cataract that had formed with gas in the eye. The retina appeared to be under traction based in the direction of her original break at approximately 10:00. I was not able to adequately visualize the retina for peeling the expected membrane and had to make the decision to perform Pars Plana Lensectomy to adequately perform the needed surgical maneuvers. A 20g MVR blade was used to enter the sclera at 10:00 and then enter the equator of the lens. The lens was hydrodissected to loosen the cortex from the anterior capsule which was planned to be preserved. The lens was removed uneventfully using the fragmatome handpiece. The vitreous cutter was used used to remove any cortex remnants. The contact lens was placed and she had a broad PVR membrane which emanated from her original tear at 10:00. It covered the retina surface back to the macula and was causing foreshortening temporally. I was able to grasp the membrane from the macular area and strip it anteriorly to the posterior edge of her original break. The vitreous cutter was used to remove any remnants form the anterior retina and vitreous base. Care wa taken to remove any residual vitreous at the vitreous base was carefully dissected/shaved flush. The buckle was well placed at  moderate height supporting her inferior lattice. An endocautery was used to mark the superior retina at the 2:00 meridian anterior to the equator for a purposeful drainage retinotomy. Perfluron liquid was placed over the macula and used to displace sub-retinal fluid anteriorly. Total air/fluid exchange was then carried out. The Perfluron was  removed from the pre-macular area with the silicone tipped cannula. Endolaser was applied over the buckle crest and surrounding all areas of lattice for 360 degrees to insure any small breaks were treated. The 20 g sclerotomy was used to place 5000CS Silicone Oil was placed filling the posterior chamber. The ocular pressure was less than by palpation. The infusion line was clamped and residual air was removed with a 25g needle on a TB syringe. The remaining cannulas were removed and the scleral openings  were closed with 7-0 nylon sutures. The globe surface and sub-conjunctival space was irrigated with BSS to remove any residual silicone droplets. The conjunctiva was then reapproximated with 6-0 Plain Gut suture. The sub-conjunctival space was irrigated with 4mg  of Decadron solution and 25 mg of Vancomycin.  The patient was given a retrobulbar block with 0.75% Bupivicaine for post-operative pain and nausea management. The eye was patched with Polymyxin/Bacitracin Ophthalmic Ointment. She was uneventfully extubated and transferred with stable vital signs to the post-operative recovery area.  Shade Flood , MD  .

## 2013-05-08 NOTE — Anesthesia Postprocedure Evaluation (Signed)
  Anesthesia Post-op Note  Patient: Sheila Hartman  Procedure(s) Performed: Procedure(s) with comments: PARS PLANA VITRECTOMY 23 GAUGE WITH SCLERAL BUCKLE (Right) PHOTOCOAGULATION WITH LASER (Right) - ENDOLASER GAS/FLUID EXCHANGE (Right)  Patient Location: PACU  Anesthesia Type:General  Level of Consciousness: awake, alert  and oriented  Airway and Oxygen Therapy: Patient Spontanous Breathing and Patient connected to nasal cannula oxygen  Post-op Pain: mild  Post-op Assessment: Post-op Vital signs reviewed  Post-op Vital Signs: Reviewed  Complications: No apparent anesthesia complications

## 2013-05-08 NOTE — Telephone Encounter (Signed)
[facility] at [facility] called to question hydrocodone prescription. Referred pt to Dr Shade Flood for follow up.

## 2013-05-08 NOTE — Preoperative (Signed)
Beta Blockers   Reason not to administer Beta Blockers:Not Applicable 

## 2013-05-08 NOTE — Anesthesia Preprocedure Evaluation (Addendum)
Anesthesia Evaluation  Patient identified by MRN, date of birth, ID band Patient awake    Reviewed: Allergy & Precautions, H&P , NPO status , Patient's Chart, lab work & pertinent test results  History of Anesthesia Complications (+) PONV  Airway Mallampati: II TM Distance: >3 FB Neck ROM: Full    Dental  (+) Dental Advisory Given, Teeth Intact and Caps   Pulmonary former smoker,    Pulmonary exam normal       Cardiovascular hypertension, Pt. on medications     Neuro/Psych  Headaches, PSYCHIATRIC DISORDERS Anxiety TIA   GI/Hepatic Neg liver ROS, GERD-  Controlled,  Endo/Other  Morbid obesity  Renal/GU negative Renal ROS     Musculoskeletal negative musculoskeletal ROS (+)   Abdominal   Peds negative pediatric ROS (+)  Hematology negative hematology ROS (+)   Anesthesia Other Findings   Reproductive/Obstetrics negative OB ROS                          Anesthesia Physical Anesthesia Plan  ASA: III  Anesthesia Plan: General   Post-op Pain Management:    Induction: Intravenous  Airway Management Planned: Oral ETT  Additional Equipment:   Intra-op Plan:   Post-operative Plan: Extubation in OR  Informed Consent: I have reviewed the patients History and Physical, chart, labs and discussed the procedure including the risks, benefits and alternatives for the proposed anesthesia with the patient or authorized representative who has indicated his/her understanding and acceptance.   Dental advisory given  Plan Discussed with: CRNA, Anesthesiologist and Surgeon  Anesthesia Plan Comments:         Anesthesia Quick Evaluation

## 2013-05-09 ENCOUNTER — Encounter (HOSPITAL_COMMUNITY): Payer: Self-pay | Admitting: Ophthalmology

## 2013-05-10 ENCOUNTER — Encounter: Payer: Self-pay | Admitting: Family Medicine

## 2013-05-10 ENCOUNTER — Ambulatory Visit (INDEPENDENT_AMBULATORY_CARE_PROVIDER_SITE_OTHER): Payer: Medicaid Other | Admitting: Family Medicine

## 2013-05-10 VITALS — BP 129/86 | HR 77 | Temp 98.0°F | Wt 303.0 lb

## 2013-05-10 DIAGNOSIS — M25519 Pain in unspecified shoulder: Secondary | ICD-10-CM

## 2013-05-10 DIAGNOSIS — M25512 Pain in left shoulder: Secondary | ICD-10-CM

## 2013-05-10 DIAGNOSIS — M79609 Pain in unspecified limb: Secondary | ICD-10-CM

## 2013-05-10 DIAGNOSIS — M79602 Pain in left arm: Secondary | ICD-10-CM

## 2013-05-10 MED ORDER — MELOXICAM 15 MG PO TABS: 15.0000 mg | ORAL_TABLET | Freq: Every day | ORAL | Status: DC

## 2013-05-10 NOTE — Progress Notes (Signed)
Patient ID: Sheila Hartman, female   DOB: June 22, 1951, 62 y.o.   MRN: [id number]  [name] Aneliz Carbary M. Kirandeep Fariss, MD Phone: [phone]   Subjective: HPI: Patient is a 62 y.o. female presenting to clinic today for same day appointment for arm pain.  Left arm pain: She has had this pain for the last 1 year. Previously not interfering with her daily life, but now getting progressively worse. She states the pain is worst at night, slightly distal to her left shoulder all the way to her elbow. She denies any injury. She has tried Aleve for pain which does not help much. She states her grip has been fine. It is worse lying on her side.    History Reviewed: Former smoker.  ROS: Please see HPI above.  Objective: Office vital signs reviewed. BP 129/86  Pulse 77  Temp(Src) 98 F (36.7 C) (Oral)  Wt 303 lb (137.44 kg)  BMI 44.72 kg/m2  Physical Examination:  General: Awake, alert. NAD HEENT: Large patch over right eye. Nystagmus of left eye (Chronic) Pulm: CTAB, no wheezes Cardio: RRR, no murmurs appreciated Extremities: TTP of left anterior shoulder. Unable to illicit any positive testing although patient did slightly grimace with raising arm above head and ?empty can test. She is very stoic, and I feel like more of the exam should have been positive Neuro: Grossly intact  Assessment: 62 y.o. female with left shoulder pain  Plan: See Problem List and After Visit Summary]

## 2013-05-10 NOTE — Patient Instructions (Addendum)
I am sorry your arm is still bothering you. Sports Medicine clinic will call you for an appointment for an ultrasound. I have sent in a prescription for Meloxicam to take for the inflammation. You can try the exercises below as well.  I will see you back as needed. Sheila Hartman M. Sheila Hartman, M.D.  Shoulder Exercises EXERCISES  RANGE OF MOTION (ROM) AND STRETCHING EXERCISES These exercises may help you when beginning to rehabilitate your injury. Your symptoms may resolve with or without further involvement from your physician, physical therapist or athletic trainer. While completing these exercises, remember:   Restoring tissue flexibility helps normal motion to return to the joints. This allows healthier, less painful movement and activity.  An effective stretch should be held for at least 30 seconds.  A stretch should never be painful. You should only feel a gentle lengthening or release in the stretched tissue. ROM - Pendulum  Bend at the waist so that your right / left arm falls away from your body. Support yourself with your opposite hand on a solid surface, such as a table or a countertop.  Your right / left arm should be perpendicular to the ground. If it is not perpendicular, you need to lean over farther. Relax the muscles in your right / left arm and shoulder as much as possible.  Gently sway your hips and trunk so they move your right / left arm without any use of your right / left shoulder muscles.  Progress your movements so that your right / left arm moves side to side, then forward and backward, and finally, both clockwise and counterclockwise.  Complete __________ repetitions in each direction. Many people use this exercise to relieve discomfort in their shoulder as well as to gain range of motion. Repeat __________ times. Complete this exercise __________ times per day. STRETCH  Flexion, Standing  Stand with good posture. With an underhand grip on your right / left hand and an  overhand grip on the opposite hand, grasp a broomstick or cane so that your hands are a little more than shoulder-width apart.  Keeping your right / left elbow straight and shoulder muscles relaxed, push the stick with your opposite hand to raise your right / left arm in front of your body and then overhead. Raise your arm until you feel a stretch in your right / left shoulder, but before you have increased shoulder pain.  Try to avoid shrugging your right / left shoulder as your arm rises by keeping your shoulder blade tucked down and toward your mid-back spine. Hold __________ seconds.  Slowly return to the starting position. Repeat __________ times. Complete this exercise __________ times per day. STRETCH - Internal Rotation  Place your right / left hand behind your back, palm-up.  Throw a towel or belt over your opposite shoulder. Grasp the towel/belt with your right / left hand.  While keeping an upright posture, gently pull up on the towel/belt until you feel a stretch in the front of your right / left shoulder.  Avoid shrugging your right / left shoulder as your arm rises by keeping your shoulder blade tucked down and toward your mid-back spine.  Hold __________. Release the stretch by lowering your opposite hand. Repeat __________ times. Complete this exercise __________ times per day. STRETCH - External Rotation and Abduction  Stagger your stance through a doorframe. It does not matter which foot is forward.  As instructed by your physician, physical therapist or athletic trainer, place your hands:  And  forearms above your head and on the door frame.  And forearms at head-height and on the door frame.  At elbow-height and on the door frame.  Keeping your head and chest upright and your stomach muscles tight to prevent over-extending your low-back, slowly shift your weight onto your front foot until you feel a stretch across your chest and/or in the front of your  shoulders.  Hold __________ seconds. Shift your weight to your back foot to release the stretch. Repeat __________ times. Complete this stretch __________ times per day.  STRENGTHENING EXERCISES  These exercises may help you when beginning to rehabilitate your injury. They may resolve your symptoms with or without further involvement from your physician, physical therapist or athletic trainer. While completing these exercises, remember:   Muscles can gain both the endurance and the strength needed for everyday activities through controlled exercises.  Complete these exercises as instructed by your physician, physical therapist or athletic trainer. Progress the resistance and repetitions only as guided.  You may experience muscle soreness or fatigue, but the pain or discomfort you are trying to eliminate should never worsen during these exercises. If this pain does worsen, stop and make certain you are following the directions exactly. If the pain is still present after adjustments, discontinue the exercise until you can discuss the trouble with your clinician.  If advised by your physician, during your recovery, avoid activity or exercises which involve actions that place your right / left hand or elbow above your head or behind your back or head. These positions stress the tissues which are trying to heal. STRENGTH - Scapular Depression and Adduction  With good posture, sit on a firm chair. Supported your arms in front of you with pillows, arm rests or a table top. Have your elbows in line with the sides of your body.  Gently draw your shoulder blades down and toward your mid-back spine. Gradually increase the tension without tensing the muscles along the top of your shoulders and the back of your neck.  Hold for __________ seconds. Slowly release the tension and relax your muscles completely before completing the next repetition.  After you have practiced this exercise, remove the arm support  and complete it in standing as well as sitting. Repeat __________ times. Complete this exercise __________ times per day.  STRENGTH - External Rotators  Secure a rubber exercise band/tubing to a fixed object so that it is at the same height as your right / left elbow when you are standing or sitting on a firm surface.  Stand or sit so that the secured exercise band/tubing is at your side that is not injured.  Bend your elbow 90 degrees. Place a folded towel or small pillow under your right / left arm so that your elbow is a few inches away from your side.  Keeping the tension on the exercise band/tubing, pull it away from your body, as if pivoting on your elbow. Be sure to keep your body steady so that the movement is only coming from your shoulder rotating.  Hold __________ seconds. Release the tension in a controlled manner as you return to the starting position. Repeat __________ times. Complete this exercise __________ times per day.  STRENGTH - Supraspinatus  Stand or sit with good posture. Grasp a __________ weight or an exercise band/tubing so that your hand is "thumbs-up," like when you shake hands.  Slowly lift your right / left hand from your thigh into the air, traveling about 30 degrees from  straight out at your side. Lift your hand to shoulder height or as far as you can without increasing any shoulder pain. Initially, many people do not lift their hands above shoulder height.  Avoid shrugging your right / left shoulder as your arm rises by keeping your shoulder blade tucked down and toward your mid-back spine.  Hold for __________ seconds. Control the descent of your hand as you slowly return to your starting position. Repeat __________ times. Complete this exercise __________ times per day.  STRENGTH - Shoulder Extensors  Secure a rubber exercise band/tubing so that it is at the height of your shoulders when you are either standing or sitting on a firm arm-less chair.  With  a thumbs-up grip, grasp an end of the band/tubing in each hand. Straighten your elbows and lift your hands straight in front of you at shoulder height. Step back away from the secured end of band/tubing until it becomes tense.  Squeezing your shoulder blades together, pull your hands down to the sides of your thighs. Do not allow your hands to go behind you.  Hold for __________ seconds. Slowly ease the tension on the band/tubing as you reverse the directions and return to the starting position. Repeat __________ times. Complete this exercise __________ times per day.  STRENGTH - Scapular Retractors  Secure a rubber exercise band/tubing so that it is at the height of your shoulders when you are either standing or sitting on a firm arm-less chair.  With a palm-down grip, grasp an end of the band/tubing in each hand. Straighten your elbows and lift your hands straight in front of you at shoulder height. Step back away from the secured end of band/tubing until it becomes tense.  Squeezing your shoulder blades together, draw your elbows back as you bend them. Keep your upper arm lifted away from your body throughout the exercise.  Hold __________ seconds. Slowly ease the tension on the band/tubing as you reverse the directions and return to the starting position. Repeat __________ times. Complete this exercise __________ times per day. STRENGTH  Scapular Depressors  Find a sturdy chair without wheels, such as a from a dining room table.  Keeping your feet on the floor, lift your bottom from the seat and lock your elbows.  Keeping your elbows straight, allow gravity to pull your body weight down. Your shoulders will rise toward your ears.  Raise your body against gravity by drawing your shoulder blades down your back, shortening the distance between your shoulders and ears. Although your feet should always maintain contact with the floor, your feet should progressively support less body weight as  you get stronger.  Hold __________ seconds. In a controlled and slow manner, lower your body weight to begin the next repetition. Repeat __________ times. Complete this exercise __________ times per day.  Document Released: 07/21/2005 Document Revised: 11/29/2011 Document Reviewed: 12/19/2008 [facility] Patient Information 2014 [place], [place].

## 2013-05-10 NOTE — Assessment & Plan Note (Signed)
Most likely has tendinopathy of left shoulder, possibly of biceps tendon. Pt does not report pain with most of the exam, but I feel like since she is complaining about pain (not typical) I feel this warrants a more thorough work up. Will refer to sports medicine for ultrasound and evaluation. Mobic prn for pain. F/u in 6 weeks.

## 2013-05-16 ENCOUNTER — Encounter (HOSPITAL_COMMUNITY): Payer: Self-pay | Admitting: Ophthalmology

## 2013-05-24 ENCOUNTER — Encounter: Payer: Self-pay | Admitting: Sports Medicine

## 2013-05-24 ENCOUNTER — Ambulatory Visit (INDEPENDENT_AMBULATORY_CARE_PROVIDER_SITE_OTHER): Payer: No Typology Code available for payment source | Admitting: Sports Medicine

## 2013-05-24 VITALS — BP 126/85 | HR 79 | Ht 69.0 in | Wt 303.0 lb

## 2013-05-24 DIAGNOSIS — M25519 Pain in unspecified shoulder: Secondary | ICD-10-CM

## 2013-05-24 DIAGNOSIS — M25512 Pain in left shoulder: Secondary | ICD-10-CM

## 2013-05-24 MED ORDER — NITROGLYCERIN 0.2 MG/HR TD PT24: MEDICATED_PATCH | TRANSDERMAL | Status: DC

## 2013-05-24 NOTE — Patient Instructions (Addendum)

## 2013-05-25 DIAGNOSIS — M25511 Pain in right shoulder: Secondary | ICD-10-CM | POA: Insufficient documentation

## 2013-05-25 DIAGNOSIS — M25519 Pain in unspecified shoulder: Secondary | ICD-10-CM | POA: Insufficient documentation

## 2013-05-25 NOTE — Progress Notes (Signed)
  Subjective:    Patient ID: Sheila Hartman, female    DOB: 1951/05/28, 62 y.o.   MRN: [id number]  HPI  chief complaint: Left shoulder pain  Very pleasant 62 year old female comes in today complaining of 1 year of left shoulder pain. No specific injury that she can recall but gradual onset of pain that's mainly along the lateral shoulder. It is present with activity such as reaching overhead or reaching behind her back. Pain will radiate at times to her elbow but no associated numbness or tingling. Pain will awaken her at night if she rolls over onto the shoulder. She denies any neck pain. She is status post right total hip arthroplasty done by Dr.Olin in November of 2013. She has done very well postoperatively. She initially saw him for her shoulder problem and he has injected her with 2 subacromial injections. Neither injection has been very helpful. She's been prescribed meloxicam which has been minimally helpful. No physical therapy. She denies any weakness. No prior shoulder surgeries. She is right-hand dominant.  Past medical history and current medications are reviewed. Her medical history is significant for a recent retinal hemorrhage in her right eye which is being cared for by ophthalmology. Surgical history significant for the aforementioned total hip arthroplasty She is allergic to penicillin She is a former smoker    Review of Systems     Objective:   Physical Exam Obese. No acute distress. Sitting comfortable in the exam room. Awake alert and oriented x3. Vital signs are reviewed.  Left shoulder: Patient has nearly full abduction and forward flexion with a positive painful ARC. External rotation limited to 70. Internal rotation limited to 60. No tenderness to palpation along the clavicle or over the a.c. joint. Some slight tenderness over the bicipital groove. Positive empty can, positive Hawkins. Rotator cuff strength is 5/5 bilaterally slightly reproducible pain with  resisted supraspinatus on the left. She is neurovascularly intact distally.  MSK ultrasound of the left shoulder was performed. Quality of images is limited by patient's body habitus. Biceps tendon is difficult to visualize. Subscapularis appears to be within normal limits. There is a hypoechoic area along the articular surface of the supraspinatus tendon seen on the long view which may suggest a partial thickness rotator cuff tear. It involves about 50% of the tendon. Infraspinatus is difficult to visualize due to body habitus.       Assessment & Plan:  1. Left shoulder pain likely secondary to rotator cuff tear  At the current time the patient is not interested in anything surgical. She needs to recover from her recent retinal hemorrhage in her right eye. We are going to try a trial of topical nitroglycerin and couple this with a home exercise program. I've asked that she take her Mobic for 7 days straight and then when necessary. She's to followup with me in about 4 weeks. If symptoms persist I would consider further diagnostic imaging in the form of x-rays and MRI as ultrasound images are limited by body habitus. Call with questions or concerns in the interim.

## 2013-06-21 ENCOUNTER — Ambulatory Visit (INDEPENDENT_AMBULATORY_CARE_PROVIDER_SITE_OTHER): Payer: No Typology Code available for payment source | Admitting: Sports Medicine

## 2013-06-21 ENCOUNTER — Encounter: Payer: Self-pay | Admitting: Sports Medicine

## 2013-06-21 VITALS — BP 135/84 | HR 72 | Ht 69.0 in | Wt 303.0 lb

## 2013-06-21 DIAGNOSIS — M25519 Pain in unspecified shoulder: Secondary | ICD-10-CM

## 2013-06-21 DIAGNOSIS — M25512 Pain in left shoulder: Secondary | ICD-10-CM

## 2013-06-21 NOTE — Assessment & Plan Note (Signed)
Pt with ongoing L shoulder pain, most likely partial RTC tear.  Has had some improvement with the nitro patch.  At this point, will continue on the nitro patch.  Pt states her desire not to have surgery on this arm at this point so will re-evaluate in 4 weeks to see how she is doing.  At that time, consider repeating US to evaluate for healing of tear.  As well continue with IR/ER and overhead stratified arm raises for muscle strengthening.  If no improvement at next visit, would consider further imaging including X-ray/MRI.

## 2013-06-21 NOTE — Patient Instructions (Addendum)
Buffi, it was nice seeing you today.  We will continue with the nitro patch for now and will see you back in 4 weeks.  Pending how you are doing at that time, will determine the next plain your care.   Thanks, Dr. Margaretha Sheffield and Dr. Paulina Fusi

## 2013-06-21 NOTE — Progress Notes (Signed)
Sheila Hartman is a 62 y.o. female who presents today for f/u of her L shoulder pain.  Pt initially seen on 9/4 c/o 1 yr of L shoulder pain, keeping her awake at night, worse with reaching overhead.  She was Rx the nitro protocol, arm exercises/strengthening.  Since that visit, pt states her pain is slightly improved from 6-7/10 to 5/10.  However, she is no longer having nighttime awakenings, feels that her overhead strength and ROM has improved.   Pt has intermittent pain that radiates down the lateral aspect of her shoulder but denies any specific neck pain/cervical pain, or trauma to the neck or arm.  Denies any paresthesias into her finger, weakness of her arm.    As well, pt has retinal hemorrhage in her R eye requiring retinal injections and eventually surgery in the next month.    Current Outpatient Prescriptions on File Prior to Visit  Medication Sig Dispense Refill  . amLODipine-benazepril (LOTREL) 5-20 MG per capsule Take 1 capsule by mouth daily.  90 capsule  3  . meloxicam (MOBIC) 15 MG tablet Take 1 tablet (15 mg total) by mouth daily.  30 tablet  0  . nitroGLYCERIN (NITRODUR - DOSED IN MG/24 HR) 0.2 mg/hr patch Apply 1/4 patch to affected area daily.  Change patch every 24 hours.  30 patch  1  . PRESCRIPTION MEDICATION Place 1 drop into the right eye 4 (four) times daily.       No current facility-administered medications on file prior to visit.    ROS: Per HPI.  All other systems reviewed and are negative.   Physical Exam Filed Vitals:   06/21/13 0847  BP: 135/84  Pulse: 72    Physical Examination:  Gen: NAD, pleasant obese 62 y/o female Shoulder exam: No gross deformities along the scapular spine B/L, no ecchymosis or edema along the greater tubercle, bicipital groove, AC joint B/L.   TTP along the greater tuberosity on the L shoulder, no AC jt TTP B/L.   ROM: Nml on R.  L: IR: 60 degrees, ER 80 degrees, Flexion 180 degrees, Abduction 150 degrees MS 5/5 F/IR/ER/E  B/L.   Negative Empty Can, Ananias Pilgrim, OBriens, [place], Speed's, Apprehension Test B/L   Neurovascular intact B/L UE.

## 2013-07-23 ENCOUNTER — Encounter: Payer: Self-pay | Admitting: Sports Medicine

## 2013-07-23 ENCOUNTER — Ambulatory Visit (INDEPENDENT_AMBULATORY_CARE_PROVIDER_SITE_OTHER): Payer: No Typology Code available for payment source | Admitting: Sports Medicine

## 2013-07-23 VITALS — BP 143/81 | Ht 69.0 in | Wt 300.0 lb

## 2013-07-23 DIAGNOSIS — M25519 Pain in unspecified shoulder: Secondary | ICD-10-CM

## 2013-07-23 DIAGNOSIS — M25512 Pain in left shoulder: Secondary | ICD-10-CM

## 2013-07-23 MED ORDER — METHYLPREDNISOLONE ACETATE 40 MG/ML IJ SUSP
40.0000 mg | Freq: Once | INTRAMUSCULAR | Status: AC
  Administered 2013-07-23: 40 mg via INTRA_ARTICULAR

## 2013-07-24 NOTE — Progress Notes (Signed)
  Subjective:    Patient ID: Sheila Hartman, female    DOB: Apr 12, 1951, 62 y.o.   MRN: [id number]  HPI Patient comes in today for followup on left shoulder pain. Previous ultrasound showed a partial tear through the supraspinatus tendon. She has been using topical nitroglycerin but despite that her pain persists. Mainly along the lateral shoulder. Present mainly with activity. No numbness or tingling.    Review of Systems     Objective:   Physical Exam Well-developed, no acute distress  Left shoulder: Patient has limited motion in all planes. There is pain but no weakness with resisted supraspinatus testing. Full strength with resisted external and internal rotation. Positive Hawkins, positive empty can. Neurovascularly intact distally.       Assessment & Plan:  Persistent left shoulder pain with ultrasound evidence of probable partial thickness rotator cuff tear  Patient is not interested in any type of surgery. I recommended trying a subacromial cortisone injection. She will stay on her topical nitroglycerin and her home exercise program. Followup in 4 weeks for recheck.  Consent obtained and verified. Time-out conducted. Noted no overlying erythema, induration, or other signs of local infection. Skin prepped in a sterile fashion. Topical analgesic spray: Ethyl chloride. Joint: left shoulder subacromial space Needle: 25g 1.5 inch Completed without difficulty. Meds: 3cc 1% xylocaine, 1cc (40mg ) depomedrol  Advised to call if fevers/chills, erythema, induration, drainage, or persistent bleeding.

## 2013-08-20 ENCOUNTER — Ambulatory Visit: Payer: No Typology Code available for payment source | Admitting: Sports Medicine

## 2013-08-23 ENCOUNTER — Ambulatory Visit: Payer: No Typology Code available for payment source | Admitting: Sports Medicine

## 2013-08-30 ENCOUNTER — Ambulatory Visit: Payer: No Typology Code available for payment source | Admitting: Sports Medicine

## 2013-10-17 ENCOUNTER — Encounter: Payer: Self-pay | Admitting: Family Medicine

## 2013-10-17 ENCOUNTER — Ambulatory Visit (INDEPENDENT_AMBULATORY_CARE_PROVIDER_SITE_OTHER): Payer: No Typology Code available for payment source | Admitting: Family Medicine

## 2013-10-17 VITALS — BP 138/83 | HR 106 | Temp 98.4°F | Ht 69.0 in | Wt 303.0 lb

## 2013-10-17 DIAGNOSIS — M549 Dorsalgia, unspecified: Secondary | ICD-10-CM

## 2013-10-17 DIAGNOSIS — I10 Essential (primary) hypertension: Secondary | ICD-10-CM

## 2013-10-17 DIAGNOSIS — Z0000 Encounter for general adult medical examination without abnormal findings: Secondary | ICD-10-CM

## 2013-10-17 MED ORDER — NAPROXEN SODIUM 220 MG PO TABS
220.0000 mg | ORAL_TABLET | Freq: Two times a day (BID) | ORAL | Status: DC
Start: 2013-10-17 — End: 2015-08-16

## 2013-10-17 MED ORDER — ZOSTER VACCINE LIVE 19400 UNT/0.65ML ~~LOC~~ SOLR: 0.6500 mL | Freq: Once | SUBCUTANEOUS | Status: DC

## 2013-10-17 MED ORDER — TETANUS-DIPHTH-ACELL PERTUSSIS 5-2.5-18.5 LF-MCG/0.5 IM SUSP: 0.5000 mL | Freq: Once | INTRAMUSCULAR | Status: DC

## 2013-10-17 MED ORDER — AMLODIPINE BESY-BENAZEPRIL HCL 5-20 MG PO CAPS: 1.0000 | ORAL_CAPSULE | Freq: Every day | ORAL | Status: DC

## 2013-10-17 NOTE — Patient Instructions (Signed)
It was good to see you!  Please schedule your mammogram.  Make an appointment to see Lamont Dowdy for your free Medicare screening.  I will see you as needed!  Destyne Goodreau M. Joan Avetisyan, M.D.

## 2013-10-18 DIAGNOSIS — M549 Dorsalgia, unspecified: Secondary | ICD-10-CM | POA: Insufficient documentation

## 2013-10-18 DIAGNOSIS — Z0000 Encounter for general adult medical examination without abnormal findings: Secondary | ICD-10-CM | POA: Insufficient documentation

## 2013-10-18 NOTE — Assessment & Plan Note (Signed)
Stable. Con't Lotrel. Refills sent to pharmacy.

## 2013-10-18 NOTE — Assessment & Plan Note (Signed)
Healthy, no concerns. GIven Rx for Tdap and Zoster to update her health maintenance. Labs utd, will need lipid profile soon.

## 2013-10-18 NOTE — Assessment & Plan Note (Signed)
Con't Aleve prn. Will do X-rays if it worsens but for now, con't to monitor.
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
## 2013-10-30 ENCOUNTER — Encounter: Payer: Self-pay | Admitting: Sports Medicine

## 2013-10-30 ENCOUNTER — Ambulatory Visit (INDEPENDENT_AMBULATORY_CARE_PROVIDER_SITE_OTHER): Payer: Commercial Managed Care - HMO | Admitting: Sports Medicine

## 2013-10-30 ENCOUNTER — Ambulatory Visit: Payer: No Typology Code available for payment source | Admitting: Sports Medicine

## 2013-10-30 VITALS — BP 137/78 | Ht 68.0 in | Wt 300.0 lb

## 2013-10-30 DIAGNOSIS — M25519 Pain in unspecified shoulder: Secondary | ICD-10-CM

## 2013-10-30 NOTE — Progress Notes (Signed)
   Subjective:    Patient ID: Sheila Hartman, female    DOB: Sep 21, 1950, 63 y.o.   MRN: [id number]  HPI: Sheila Hartman presents to [facility] for f/u of left shoulder pain; previous ultrasound showed partial-thickness supraspinatus tendon tear and pt has completed a course of nitro patches which she states helped greatly (she states "it just took a while"). She had a subacromial injection in November and had several weeks of relief with that. She has not been doing rotator cuff exercises for the last few weeks. Overall her pain is much improved, and she uses Aleve occasionally. She is much better able to sleep. When she does have pain, it is slightly sharp and she describes some rare, brief 'catching' sensations. Pain is worst with reaching behind her, and she has some intermittent dull, deep ache especially with changes in whether. She reiterates that she is glad relatively conservative therapy is helping as she absolutely wants to avoid surgery.  Review of Systems: As above. Otherwise feels well, with no fever / chills, SOB, coryza-type symptoms.     Objective:   Physical Exam BP 137/78  Ht 5\' 8"  (1.727 m)  Wt 300 lb (136.079 kg)  BMI 45.63 kg/m2 Gen: well-appearing, well-nourished / well-developed obese adult female, in NAD MSK:   Bilateral shoulders normal in appearance without frank deformity or effusion  Mild tenderness to palpation in left supraspinatus / trapezius region  No bony tenderness of either shoulder  Exhibits 80-90% ROM with forward flexion of left shoulder  Otherwise has full left shoulder ROM with slightly increased pain in supraspinatus region  Slightly increased pain with left lift-off testing  Exhibits full active ROM in right shoulder in all planes without pain Neurovascular:   Alert / oriented, no gross focal deficit, sensation grossly intact to bilateral UE  Strength 5/5 in RUE in shoulder flexion, abduction, internal and external rotation and lift-off  Strength 4+/5  secondary to pain with lift-off and resisted flexion at shoulder in empty-can position with slight abduction  Strength 5/5 in LUE in same motions as RUE, otherwise  Distal UE pulses intact / symmetric bilaterally     Assessment & Plan:  Left shoulder pain, improving s/p nitro protocol and shoulder injection in November. Advised continued OTC NSAIDs as needed and strongly encouraged continuing exercises for strengthening and to help prevent recurrence / worsening of pain or further tears. F/u as needed. If pain worsens again or new symptoms develop, consider repeat ultrasound / further imaging.  The above was discussed in its entirety with attending Dr. Micheline Chapman.  Emmaline Kluver, MD PGY-2, [facility] 10/30/2013, 10:57 AM

## 2013-11-28 ENCOUNTER — Other Ambulatory Visit: Payer: Self-pay | Admitting: Family Medicine

## 2013-11-28 ENCOUNTER — Other Ambulatory Visit: Payer: Self-pay

## 2013-11-28 DIAGNOSIS — Z1231 Encounter for screening mammogram for malignant neoplasm of breast: Secondary | ICD-10-CM

## 2013-12-13 ENCOUNTER — Ambulatory Visit
Admission: RE | Admit: 2013-12-13 | Discharge: 2013-12-13 | Disposition: A | Discharge status: Home / Self Care | Payer: Commercial Managed Care - HMO | Source: Ambulatory Visit

## 2013-12-13 DIAGNOSIS — Z1231 Encounter for screening mammogram for malignant neoplasm of breast: Secondary | ICD-10-CM

## 2014-02-20 ENCOUNTER — Telehealth: Payer: Self-pay | Admitting: Family Medicine

## 2014-02-20 ENCOUNTER — Ambulatory Visit (INDEPENDENT_AMBULATORY_CARE_PROVIDER_SITE_OTHER): Payer: Commercial Managed Care - HMO | Admitting: Family Medicine

## 2014-02-20 ENCOUNTER — Encounter: Payer: Self-pay | Admitting: Family Medicine

## 2014-02-20 VITALS — BP 131/84 | HR 79 | Ht 68.0 in | Wt 306.0 lb

## 2014-02-20 DIAGNOSIS — H579 Unspecified disorder of eye and adnexa: Secondary | ICD-10-CM | POA: Insufficient documentation

## 2014-02-20 DIAGNOSIS — I10 Essential (primary) hypertension: Secondary | ICD-10-CM

## 2014-02-20 DIAGNOSIS — M25569 Pain in unspecified knee: Secondary | ICD-10-CM

## 2014-02-20 DIAGNOSIS — M549 Dorsalgia, unspecified: Secondary | ICD-10-CM

## 2014-02-20 MED ORDER — TETANUS-DIPHTH-ACELL PERTUSSIS 5-2.5-18.5 LF-MCG/0.5 IM SUSP: 0.5000 mL | Freq: Once | INTRAMUSCULAR | Status: DC

## 2014-02-20 MED ORDER — MELOXICAM 15 MG PO TABS: 15.0000 mg | ORAL_TABLET | Freq: Every day | ORAL | Status: DC

## 2014-02-20 MED ORDER — VARICELLA VIRUS VACCINE LIVE 1350 PFU/0.5ML IJ SUSR: 0.5000 mL | Freq: Once | INTRAMUSCULAR | Status: DC

## 2014-02-20 NOTE — Assessment & Plan Note (Signed)
A: Chronic and stable  P: - F/u with Dr. Anderson Malta - Await DMV decision

## 2014-02-20 NOTE — Progress Notes (Signed)
Patient ID: Sheila Hartman, female   DOB: [birth year], 63 y.o.   MRN: [id number]    Subjective: HPI: Patient is a 63 y.o. female presenting to clinic today for follow up appointment. Concerns today include knee pain, back pain, abx for dentist  1. Hypertension Blood pressure today: 131/84 Taking Meds: Lotrel  Side effects: None ROS: Denies headache, visual changes, nausea, vomiting, chest pain, abdominal pain or shortness of breath.  2. Back pain: She has a lot of back pain. Worse if she's standing or sitting. 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 She is doing silver sneakers but her back limits her activity. She takes aleve which helps some. She had not had evaluation recently, but reports history of herniated discs from [place].   3. Knee pain: Bilateral knee pain, L>R had arthroscopic surgery in the past on the left but has more swelling. Tried aleve.  4. Eye problems: She has a had a lot of eye issues in the last year. She sees Dr. Anderson Malta (retinal specialist) going back in July. She is still awaiting the [place] medical review board decision about her license.  History Reviewed: Former smoker.  ROS: Please see HPI above.  Objective: Office vital signs reviewed. BP 131/84  Pulse 79  Ht 5\' 8"  (1.727 m)  Wt 306 lb (138.801 kg)  BMI 46.54 kg/m2  Physical Examination:  General: Awake, alert. NAD HEENT: Atraumatic, normocephalic Neck: No masses palpated. No LAD Pulm: CTAB, no wheezes Cardio: RRR, no murmurs appreciated Abdomen:+BS, soft, nontender, nondistended Extremities: Edema of left knee, TTP of medial joint lines bilaterally. Good ROM. Neuro: Grossly intact  Assessment: 63 y.o. female Follow up  Plan: See Problem List and After Visit Summary

## 2014-02-20 NOTE — Assessment & Plan Note (Signed)
A: s/p arthroscopy, now with edema of medial joint line.  P: - Would like SM evaluation. Will call Dr. Micheline Chapman for appointment - Con't exercise.

## 2014-02-20 NOTE — Assessment & Plan Note (Signed)
A:History of disc issues. Worsening back pain.  P: Discussed X-rays. At this time, she would like to return to ortho for evaluation  - She will call ortho - Meloxicam daily - Con't exercising

## 2014-02-20 NOTE — Telephone Encounter (Signed)
Can you please have her call Dr. Aurea Graff office about this?  Based on my research and what we have been taught, dental prophylaxis is not necessary for prosthetic joints.   Thanks, [employer]. Dilyn Smiles, M.D.

## 2014-02-20 NOTE — Assessment & Plan Note (Signed)
A: At goal  P: - Con't Lotrel - Needs labs next visit

## 2014-02-20 NOTE — Telephone Encounter (Signed)
Pt calling back to ask that the antibiotic she needed for her teeth called in to the pharmacy.  Let her know when it's been sent

## 2014-02-20 NOTE — Patient Instructions (Signed)
It was good to see you.  For your back, call [facility] to see their back specialist.   For your knee, please see Dr. Micheline Chapman.  Your blood pressure was beautiful today.  Keep me updated on your eye situation.  Khristin Keleher M. Tyshell Ramberg, M.D.

## 2014-02-21 NOTE — Telephone Encounter (Signed)
Spoke with receptionist at Dr. Honor Loh office who reviewd pts records and indicated that pt had total hip replacement.  She advised that if a pt has a prosthetic then the abx "are for life" with a dental procedure.  Sheila Hartman Sheila Hartman

## 2014-02-22 MED ORDER — AZITHROMYCIN 250 MG PO TABS: ORAL_TABLET | ORAL | Status: DC

## 2014-02-22 NOTE — Telephone Encounter (Signed)
Pt informed. Sheila Hartman  

## 2014-02-22 NOTE — Telephone Encounter (Signed)
Since Dr. Alvan Dame prefers that, I have sent Azithro to the pharmacy for her. She is allergic to Penicillin and cannot take the usual Amox or Keflex.  She should take Azithromycin 500mg  (2 tabs) 30-60 minutes prior to dental procedure.  Thanks, [employer]. Deaven Barron, M.D.

## 2014-03-07 ENCOUNTER — Encounter: Payer: Self-pay | Admitting: Home Health Services

## 2014-03-07 ENCOUNTER — Ambulatory Visit (INDEPENDENT_AMBULATORY_CARE_PROVIDER_SITE_OTHER): Payer: Commercial Managed Care - HMO | Admitting: Home Health Services

## 2014-03-07 VITALS — BP 140/77 | HR 78 | Temp 97.8°F | Ht 68.5 in | Wt 307.5 lb

## 2014-03-07 DIAGNOSIS — M539 Dorsopathy, unspecified: Secondary | ICD-10-CM

## 2014-03-07 DIAGNOSIS — Z0000 Encounter for general adult medical examination without abnormal findings: Secondary | ICD-10-CM

## 2014-03-07 DIAGNOSIS — M259 Joint disorder, unspecified: Secondary | ICD-10-CM

## 2014-03-07 HISTORY — DX: Dorsopathy, unspecified: M53.9

## 2014-03-07 HISTORY — DX: Joint disorder, unspecified: M25.9

## 2014-03-07 NOTE — Progress Notes (Signed)
Patient here for annual wellness visit, patient reports: Risk Factors/Conditions needing evaluation or treatment: Pt. denies having any risk factors that need evaluation.  Home Safety: Pt lives at home, with her niece in a 1 story home.  Pt reports having smoke alarms and not having adaptive equipment.  Other Information: Corrective lens: Pt having daily corrective lens, has annual eye exams. Pt reports having chronic retina detachment.  Dentures: Pt does not have dentures, has annual dental exams. Memory: Pt reports having some memory problems and has hard time recalling from events from previous day.   Patient's Mini Mental Score (recorded in doc. flowsheet): 28 Bladder:  Pt denies problems with bladder control.  ADL/IADL:  Pt reports independence in all functions. Balance/Gait: Pt reports 0 falls in the past year.  We discussed home safety and fall prevention.    Balance Abnormal Patient value  Sitting balance    Sit to stand    Attempts to arise    Immediate standing balance    Standing balance    Nudge    Eyes closed- Romberg    Tandem stance    Back lean    Neck Rotation    360 degree turn    Sitting down     Pt declined hearing assessment at this time.    Annual Wellness Visit Requirements Recorded Today In  Medical, family, social history Past Medical, Family, Social History Section  Current providers Care team  Current medications Medications  Wt, BP, Ht, BMI Vital signs        Tobacco, alcohol, illicit drug use History  ADL Nurse Assessment  Depression Screening Nurse Assessment  Cognitive impairment Nurse Assessment  Mini Mental Status Document Flowsheet  Fall Risk Fall/Depression  Home Safety Progress Note  End of Life Planning (welcome visit) Social Documentation  Medicare preventative services Progress Note  Risk factors/conditions needing evaluation/treatment Progress Note  Personalized health advice Patient Instructions, goals, letter  Diet & Exercise  Social Documentation  Emergency Contact Social Documentation  Seat Belts Social Documentation  Sun exposure/protection Social Documentation    I have reviewed this visit and discussed with Lamont Dowdy and agree with her documentation.  Amber M. Hairford, M.D.

## 2014-04-16 ENCOUNTER — Ambulatory Visit (INDEPENDENT_AMBULATORY_CARE_PROVIDER_SITE_OTHER): Payer: Commercial Managed Care - HMO | Admitting: Family Medicine

## 2014-04-16 ENCOUNTER — Encounter: Payer: Self-pay | Admitting: Family Medicine

## 2014-04-16 VITALS — BP 132/84 | HR 91 | Temp 97.5°F | Resp 18 | Wt 301.0 lb

## 2014-04-16 DIAGNOSIS — R519 Headache, unspecified: Secondary | ICD-10-CM

## 2014-04-16 DIAGNOSIS — R51 Headache: Secondary | ICD-10-CM

## 2014-04-16 NOTE — Patient Instructions (Signed)
Thanks for coming in today.  While we did not find anything wrong, please keep a close eye on your symptoms and if things change or worsen, please come back in immediately.

## 2014-04-16 NOTE — Progress Notes (Signed)
    Subjective:    Patient ID: Sheila Hartman is a 63 y.o. female presenting with Hypertension  on 04/16/2014  HPI: Reports yesterday that she felt funny.  No headache and no dizziness.  Rested and it feels some better today.  Noted a pins and needles effect on the back of the scalp.  Feels like a band around head. Reports h/o TIA and wonders if that was a panic attack.  Was given rx for shingles shot, but has not received it.  Denies weakness or numbness of extremeties.   Review of Systems  Constitutional: Negative for fever and chills.  Respiratory: Negative for chest tightness and shortness of breath.   Gastrointestinal: Negative for nausea and vomiting.  Genitourinary: Negative for dysuria.      Objective:    BP 132/84  Pulse 91  Temp(Src) 97.5 F (36.4 C) (Oral)  Resp 18  Wt 301 lb (136.533 kg)  SpO2 100% Physical Exam  Constitutional: She is oriented to person, place, and time. She appears well-developed and well-nourished.  Neck: Neck supple.  Cardiovascular: Normal rate.   Pulmonary/Chest: Effort normal.  Abdominal: Soft.  Neurological: She is alert and oriented to person, place, and time. She has normal strength. She displays normal reflexes. No sensory deficit. She exhibits normal muscle tone. Coordination normal.  Skin: Skin is dry.  Psychiatric: She has a normal mood and affect. Her behavior is normal.        Assessment & Plan:  Scalp tenderness  Unclear etiology- ? Early shingles.  Advised to return if lesions or if symptoms worsen or fail to improve.  Return in about 4 weeks (around 05/14/2014).

## 2014-06-28 ENCOUNTER — Encounter (HOSPITAL_COMMUNITY): Payer: Self-pay | Admitting: Emergency Medicine

## 2014-06-28 ENCOUNTER — Ambulatory Visit: Payer: Commercial Managed Care - HMO | Admitting: Family Medicine

## 2014-06-28 ENCOUNTER — Emergency Department (INDEPENDENT_AMBULATORY_CARE_PROVIDER_SITE_OTHER)
Admission: EM | Admit: 2014-06-28 | Discharge: 2014-06-28 | Disposition: A | Discharge status: Home / Self Care | Payer: Commercial Managed Care - HMO | Source: Home / Self Care | Attending: Family Medicine | Admitting: Family Medicine

## 2014-06-28 DIAGNOSIS — J302 Other seasonal allergic rhinitis: Secondary | ICD-10-CM

## 2014-06-28 MED ORDER — CETIRIZINE HCL 10 MG PO TABS: 10.0000 mg | ORAL_TABLET | Freq: Every day | ORAL | Status: DC

## 2014-06-28 MED ORDER — FLUTICASONE PROPIONATE 50 MCG/ACT NA SUSP: 1.0000 | Freq: Two times a day (BID) | NASAL | Status: DC

## 2014-06-28 MED ORDER — METHYLPREDNISOLONE ACETATE 80 MG/ML IJ SUSP
INTRAMUSCULAR | Status: AC
  Filled 2014-06-28: qty 1

## 2014-06-28 MED ORDER — METHYLPREDNISOLONE ACETATE 40 MG/ML IJ SUSP
80.0000 mg | Freq: Once | INTRAMUSCULAR | Status: AC
  Administered 2014-06-28: 80 mg via INTRAMUSCULAR
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
## 2014-06-28 NOTE — ED Notes (Signed)
63 year old female with c/o Sore throat headache, cough and runny nose for 3 weeks.  Has  No history of seasonal allergies

## 2014-07-22 ENCOUNTER — Encounter (HOSPITAL_COMMUNITY): Payer: Self-pay | Admitting: Emergency Medicine

## 2014-10-22 ENCOUNTER — Ambulatory Visit (INDEPENDENT_AMBULATORY_CARE_PROVIDER_SITE_OTHER): Payer: Commercial Managed Care - HMO | Admitting: Obstetrics and Gynecology

## 2014-10-22 ENCOUNTER — Encounter: Payer: Self-pay | Admitting: Obstetrics and Gynecology

## 2014-10-22 VITALS — BP 132/90 | HR 82 | Temp 97.7°F | Ht 68.5 in | Wt 305.0 lb

## 2014-10-22 DIAGNOSIS — M170 Bilateral primary osteoarthritis of knee: Secondary | ICD-10-CM

## 2014-10-22 DIAGNOSIS — M25562 Pain in left knee: Secondary | ICD-10-CM

## 2014-10-22 DIAGNOSIS — M179 Osteoarthritis of knee, unspecified: Secondary | ICD-10-CM | POA: Insufficient documentation

## 2014-10-22 DIAGNOSIS — I10 Essential (primary) hypertension: Secondary | ICD-10-CM

## 2014-10-22 DIAGNOSIS — M1710 Unilateral primary osteoarthritis, unspecified knee: Secondary | ICD-10-CM | POA: Insufficient documentation

## 2014-10-22 MED ORDER — METHYLPREDNISOLONE ACETATE 40 MG/ML IJ SUSP
40.0000 mg | Freq: Once | INTRAMUSCULAR | Status: AC
  Administered 2014-10-22: 40 mg via INTRA_ARTICULAR

## 2014-10-22 MED ORDER — AMLODIPINE BESY-BENAZEPRIL HCL 5-20 MG PO CAPS: 1.0000 | ORAL_CAPSULE | Freq: Every day | ORAL | Status: DC

## 2014-10-22 NOTE — Assessment & Plan Note (Signed)
A: Chronic pain, L knee worse than R. Osteophytes present.  P: Originally appeared as though patient had L. Knee effusion. Procedure to L knee aspiration injection performed. No fluid aspirated. Steroid injections placed. Patient was educated on side effects and when to RTC. She does not want any chronic pain meds for knee. She also does not want referral for knee replacement. She is to continue using Mobic and Aleve prn.

## 2014-10-22 NOTE — Progress Notes (Signed)
     Subjective: Chief Complaint  Patient presents with  . Hypertension    HPI: Sheila Hartman is a 64 y.o. presenting to clinic today to discuss the following:  #Hypertension Blood pressure at home: patient does not check pressures at home but stays they are normal Blood pressure today: 132/90 Taking Meds: Lotrel Side effects: None ROS: Denies headache, dizziness, visual changes, nausea, vomiting, chest pain, abdominal pain or shortness of breath. Here for medication refill  #Knee pain: Patient states that her knees stay swollen and painful. She uses mobic prn for pain but says she likes to try and go without. Has been diagnosed with osteoarthritis. Has never had injection of knee. States that the left knee is the more bothersome knee. It is more swollen. Knee pain wakes her up at night.   Health Maintenance: Patient states her last colonoscopy was in 2009 and they told her return in 10 yrs. Flu, pap.   All systems were reviewed and were negative unless otherwise noted in the HPI Past Medical, Surgical, Social, and Family History Reviewed & Updated per EMR.   Objective: BP 132/90 mmHg  Pulse 82  Temp(Src) 97.7 F (36.5 C) (Oral)  Ht 5' 8.5" (1.74 m)  Wt 305 lb (138.347 kg)  BMI 45.70 kg/m2  Physical Exam  Constitutional: She is oriented to person, place, and time and well-developed, well-nourished, and in no distress.  HENT:  Head: Normocephalic and atraumatic.  Lipoma located on lateral aspect of forehead.  Neck: Normal range of motion. Neck supple. No thyromegaly present.  Lipoma on R. side of neck  Cardiovascular: Normal rate, regular rhythm and normal heart sounds.   No murmur heard. Pulmonary/Chest: Effort normal and breath sounds normal. She has no wheezes. She has no rales.  Abdominal: Soft. Bowel sounds are normal. There is no tenderness.  Musculoskeletal: Normal range of motion. She exhibits no edema or tenderness.  L. Knee with with lipomas.     Neurological: She is alert and oriented to person, place, and time.  Skin: Skin is warm and dry. No rash noted.     No results found for this or any previous visit (from the past 72 hour(s)).  Assessment/Plan: Please see problem based Assessment and Plan  Health Maintainance: Offered flu shot to patient today, but pt declined. Not due for colonoscopy yet. Does not want pap smear today.    Meds ordered this encounter  Medications  . amLODipine-benazepril (LOTREL) 5-20 MG per capsule    Sig: Take 1 capsule by mouth daily.    Dispense:  90 capsule    Refill:  3  . methylPREDNISolone acetate (DEPO-MEDROL) injection 40 mg    Sig:     Procedure: After informed written consent was obtained, patient was seated on exam table. Left knee was prepped with betadine x 3 followed by alcohol swab. Utilizing anterolateral approach, patient's left knee was injected intraarticularly with mixture of 1cc of depomedrol 40mg /mL and 4cc of 1% lidocaine without epinephrine. Patient tolerated the procedure well without immediate complications.   Luiz Blare, DO 10/22/2014, 9:54 AM PGY-1, [place] [facility]

## 2014-10-22 NOTE — Assessment & Plan Note (Signed)
A: At goal.  P: Continue Lotrel. Will check labs at next visit. Refill given.

## 2014-10-22 NOTE — Assessment & Plan Note (Deleted)
A: Joint effusion on L. Knee, with

## 2014-10-22 NOTE — Patient Instructions (Signed)
Osteoarthritis Osteoarthritis is a disease that causes soreness and inflammation of a joint. It occurs when the cartilage at the affected joint wears down. Cartilage acts as a cushion, covering the ends of bones where they meet to form a joint. Osteoarthritis is the most common form of arthritis. It often occurs in older people. The joints affected most often by this condition include those in the:  Ends of the fingers.  Thumbs.  Neck.  Lower back.  Knees.  Hips. CAUSES  Over time, the cartilage that covers the ends of bones begins to wear away. This causes bone to rub on bone, producing pain and stiffness in the affected joints.  RISK FACTORS Certain factors can increase your chances of having osteoarthritis, including:  Older age.  Excessive body weight.  Overuse of joints.  Previous joint injury. SIGNS AND SYMPTOMS   Pain, swelling, and stiffness in the joint.  Over time, the joint may lose its normal shape.  Small deposits of bone (osteophytes) may grow on the edges of the joint.  Bits of bone or cartilage can break off and float inside the joint space. This may cause more pain and damage. DIAGNOSIS  Your health care provider will do a physical exam and ask about your symptoms. Various tests may be ordered, such as:  X-rays of the affected joint.  An MRI scan.  Blood tests to rule out other types of arthritis.  Joint fluid tests. This involves using a needle to draw fluid from the joint and examining the fluid under a microscope. TREATMENT  Goals of treatment are to control pain and improve joint function. Treatment plans may include:  A prescribed exercise program that allows for rest and joint relief.  A weight control plan.  Pain relief techniques, such as:  Properly applied heat and cold.  Electric pulses delivered to nerve endings under the skin (transcutaneous electrical nerve stimulation [TENS]).  Massage.  Certain nutritional  supplements.  Medicines to control pain, such as:  Acetaminophen.  Nonsteroidal anti-inflammatory drugs (NSAIDs), such as naproxen.  Narcotic or central-acting agents, such as tramadol.  Corticosteroids. These can be given orally or as an injection.  Surgery to reposition the bones and relieve pain (osteotomy) or to remove loose pieces of bone and cartilage. Joint replacement may be needed in advanced states of osteoarthritis. HOME CARE INSTRUCTIONS   Take medicines only as directed by your health care provider.  Maintain a healthy weight. Follow your health care provider's instructions for weight control. This may include dietary instructions.  Exercise as directed. Your health care provider can recommend specific types of exercise. These may include:  Strengthening exercises. These are done to strengthen the muscles that support joints affected by arthritis. They can be performed with weights or with exercise bands to add resistance.  Aerobic activities. These are exercises, such as brisk walking or low-impact aerobics, that get your heart pumping.  Range-of-motion activities. These keep your joints limber.  Balance and agility exercises. These help you maintain daily living skills.  Rest your affected joints as directed by your health care provider.  Keep all follow-up visits as directed by your health care provider. SEEK MEDICAL CARE IF:   Your skin turns red.  You develop a rash in addition to your joint pain.  You have worsening joint pain.  You have a fever along with joint or muscle aches. SEEK IMMEDIATE MEDICAL CARE IF:  You have a significant loss of weight or appetite.  You have night sweats. FOR MORE  [place] of Arthritis and Musculoskeletal and Skin Diseases: www.niams.SouthExposed.es  [employer] on Aging: http://kim-miller.com/  American College of Rheumatology: www.rheumatology.org Document Released: 09/06/2005 Document Revised:  01/21/2014 Document Reviewed: 05/14/2013 [facility] Patient Information 2015 [place], [place]. This information is not intended to replace advice given to you by your health care provider. Make sure you discuss any questions you have with your health care provider.   Monitor for any signs of increased swelling, excessive bleeding, or infection. If you have any of theses signs by tomorrow please come in to be reevaluated.

## 2015-01-17 ENCOUNTER — Ambulatory Visit (INDEPENDENT_AMBULATORY_CARE_PROVIDER_SITE_OTHER): Payer: Commercial Managed Care - HMO | Admitting: Obstetrics and Gynecology

## 2015-01-17 ENCOUNTER — Encounter: Payer: Self-pay | Admitting: Obstetrics and Gynecology

## 2015-01-17 VITALS — BP 144/80 | HR 88 | Temp 98.8°F | Ht 68.5 in | Wt 307.0 lb

## 2015-01-17 DIAGNOSIS — J029 Acute pharyngitis, unspecified: Secondary | ICD-10-CM

## 2015-01-17 DIAGNOSIS — M170 Bilateral primary osteoarthritis of knee: Secondary | ICD-10-CM | POA: Diagnosis not present

## 2015-01-17 DIAGNOSIS — D179 Benign lipomatous neoplasm, unspecified: Secondary | ICD-10-CM

## 2015-01-17 DIAGNOSIS — D170 Benign lipomatous neoplasm of skin and subcutaneous tissue of head, face and neck: Secondary | ICD-10-CM | POA: Insufficient documentation

## 2015-01-17 DIAGNOSIS — I10 Essential (primary) hypertension: Secondary | ICD-10-CM

## 2015-01-17 LAB — POCT RAPID STREP A (OFFICE): Rapid Strep A Screen: NEGATIVE

## 2015-01-17 MED ORDER — CETIRIZINE HCL 10 MG PO TABS: 10.0000 mg | ORAL_TABLET | Freq: Every day | ORAL | Status: DC

## 2015-01-17 MED ORDER — FLUTICASONE PROPIONATE 50 MCG/ACT NA SUSP: 1.0000 | Freq: Two times a day (BID) | NASAL | Status: DC

## 2015-01-17 NOTE — Assessment & Plan Note (Addendum)
Most likely due to viral illness vs allergies. Patient encouraged to continue conservative management and use allergy medication consistently to see if symptoms improve. Strep swab performed in clinic was negative. Handout given.

## 2015-01-17 NOTE — Assessment & Plan Note (Signed)
BP today 144/80. This is appropriate BP for elderly patients >62yo according to JNC-8 guidelines. Will not adjust medications at this time.

## 2015-01-17 NOTE — Assessment & Plan Note (Signed)
Chronic, stable issue. Patient with continued soreness. Failed knee injection in office (unable to perform due to bone spurs and pain). Referral to sports medicine placed. Encouraged patient to continue with movement as this is the best thing for pain.  Continue taking Mobic or Aleve for pain relief.

## 2015-01-17 NOTE — Progress Notes (Signed)
    Subjective: Chief Complaint  Patient presents with  . Allergies    constant sore throat  . Knee Pain    bilateral    HPI: Sheila Hartman is a 64 y.o. presenting to clinic today to discuss the following:  #Knee Pain: -bilateral knee soreness -hx of OA -patient takes 2 Aleve at night that helps some -does not like taking a lot of medications -intermittently uses her mobic -says she played sports when younger that required a lot of knee pressure -wants to know what she can do to get better relief -says she still gets up and moves about due to having kids to take care of  #Sore Throat: -sore throat intermittently every 2 weeks -once sore throat then has cough after  -trys home remedies(tea, honey, ginger) helps some -never had this before -duration: 1 month -not taking any allergy medications currently  ROS reviewed and were negative unless otherwise noted in HPI.  Past Medical, Surgical, Social, and Family History Reviewed & Updated per EMR.   Objective: BP 144/80 mmHg  Pulse 88  Temp(Src) 98.8 F (37.1 C) (Oral)  Ht 5' 8.5" (1.74 m)  Wt 307 lb (139.254 kg)  BMI 45.99 kg/m2  Physical Exam  Constitutional: She is oriented to person, place, and time and well-developed, well-nourished, and in no distress.  HENT:  Mouth/Throat: Oropharynx is clear and moist. No oropharyngeal exudate.  Lipoma on forehead  Neck: Neck supple. No thyromegaly present.  Large lipoma located on R. lateral neck  Cardiovascular: Normal rate, regular rhythm and normal heart sounds.   Pulmonary/Chest: Effort normal and breath sounds normal.  Musculoskeletal: She exhibits no edema or tenderness.  Limited ROM  Lymphadenopathy:    She has no cervical adenopathy.  Neurological: She is alert and oriented to person, place, and time.  Skin: Skin is warm and dry.    Results for orders placed or performed in visit on 01/17/15 (from the past 72 hour(s))  POCT rapid strep A     Status: None     Collection Time: 01/17/15  3:05 PM  Result Value Ref Range   Rapid Strep A Screen Negative Negative    Assessment/Plan: Please see problem based Assessment and Plan   Orders Placed This Encounter  Procedures  . Ambulatory referral to Sports Medicine    Referral Priority:  Routine    Referral Type:  Consultation    Number of Visits Requested:  1  . POCT rapid strep A    Meds ordered this encounter  Medications  . fluticasone (FLONASE) 50 MCG/ACT nasal spray    Sig: Place 1 spray into both nostrils 2 (two) times daily.    Dispense:  1 g    Refill:  2  . cetirizine (ZYRTEC) 10 MG tablet    Sig: Take 1 tablet (10 mg total) by mouth daily. One tab daily for allergies    Dispense:  30 tablet    Refill:  [place], DO 01/17/2015, 3:21 PM PGY-1, [place]

## 2015-01-17 NOTE — Patient Instructions (Addendum)
Sheila Hartman it was great to see you today!  I am pleased to hear that things are going well for you.  Here are some of the things we discussed today: -I put in a referral for you to go see Sports Medicine for your knee pain. I hope they can help relieve some of your soreness -Continue using your mobic and aleve as needed -continue moving as this is the best thing for your OA -For your sore throat read below; strep test was neg.  -Take your allergy medications as this may be due to allergies as well as a virus.   -your blood pressure is appropriate for your age. We want it <150/90 in adults greater than 60.  Please schedule a follow-up appointment as needed.   Thanks for allowing me to be a part of your care! Dr. Gerarda Fraction  Sore Throat A sore throat is a painful, burning, sore, or scratchy feeling of the throat. There may be pain or tenderness when swallowing or talking. You may have other symptoms with a sore throat. These include coughing, sneezing, fever, or a swollen neck. A sore throat is often the first sign of another sickness. These sicknesses may include a cold, flu, strep throat, or an infection called mono. Most sore throats go away without medical treatment.  HOME CARE   Only take medicine as told by your doctor.  Drink enough fluids to keep your pee (urine) clear or pale yellow.  Rest as needed.  Try using throat sprays, lozenges, or suck on hard candy (if older than 4 years or as told).  Sip warm liquids, such as broth, herbal tea, or warm water with honey. Try sucking on frozen ice pops or drinking cold liquids.  Rinse the mouth (gargle) with salt water. Mix 1 teaspoon salt with 8 ounces of water.  Do not smoke. Avoid being around others when they are smoking.  Put a humidifier in your bedroom at night to moisten the air. You can also turn on a hot shower and sit in the bathroom for 5-10 minutes. Be sure the bathroom door is closed. GET HELP RIGHT AWAY IF:   You have  trouble breathing.  You cannot swallow fluids, soft foods, or your spit (saliva).  You have more puffiness (swelling) in the throat.  Your sore throat does not get better in 7 days.  You feel sick to your stomach (nauseous) and throw up (vomit).  You have a fever or lasting symptoms for more than 2-3 days.  You have a fever and your symptoms suddenly get worse. MAKE SURE YOU:   Understand these instructions.  Will watch your condition.  Will get help right away if you are not doing well or get worse. Document Released: 06/15/2008 Document Revised: 05/31/2012 Document Reviewed: 05/14/2012 [facility] Patient Information 2015 [place], [place]. This information is not intended to replace advice given to you by your health care provider. Make sure you discuss any questions you have with your health care provider.

## 2015-01-20 ENCOUNTER — Other Ambulatory Visit: Payer: Self-pay

## 2015-01-20 DIAGNOSIS — Z1231 Encounter for screening mammogram for malignant neoplasm of breast: Secondary | ICD-10-CM

## 2015-01-22 ENCOUNTER — Ambulatory Visit
Admission: RE | Admit: 2015-01-22 | Discharge: 2015-01-22 | Disposition: A | Discharge status: Home / Self Care | Payer: Commercial Managed Care - HMO | Source: Ambulatory Visit

## 2015-01-22 DIAGNOSIS — Z1231 Encounter for screening mammogram for malignant neoplasm of breast: Secondary | ICD-10-CM

## 2015-01-27 ENCOUNTER — Ambulatory Visit (INDEPENDENT_AMBULATORY_CARE_PROVIDER_SITE_OTHER): Payer: Commercial Managed Care - HMO | Admitting: Sports Medicine

## 2015-01-27 ENCOUNTER — Other Ambulatory Visit: Payer: Self-pay | Admitting: Sports Medicine

## 2015-01-27 ENCOUNTER — Encounter: Payer: Self-pay | Admitting: Sports Medicine

## 2015-01-27 ENCOUNTER — Ambulatory Visit
Admission: RE | Admit: 2015-01-27 | Discharge: 2015-01-27 | Disposition: A | Discharge status: Home / Self Care | Payer: Commercial Managed Care - HMO | Source: Ambulatory Visit | Attending: Sports Medicine | Admitting: Sports Medicine

## 2015-01-27 VITALS — BP 133/89 | HR 81 | Ht 69.0 in | Wt 315.0 lb

## 2015-01-27 DIAGNOSIS — M170 Bilateral primary osteoarthritis of knee: Secondary | ICD-10-CM
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
## 2015-01-28 ENCOUNTER — Telehealth: Payer: Self-pay | Admitting: Sports Medicine

## 2015-01-28 NOTE — Telephone Encounter (Signed)
-----   Message from Babette Relic sent at 01/27/2015  5:13 PM EDT ----- Regarding: Returning your call Contact: [phone] Sheila Hartman returned your call this evening.  Will call back on Tuesday if she doesn't hear from you.

## 2015-01-28 NOTE — Telephone Encounter (Signed)
I spoke with the patient on the phone today after reviewing the x-rays of both knees. Left knee has end stage medial compartmental DJD. Right knee shows moderately advanced medial compartmental DJD. I discussed the possibility of a cortisone injection for the more symptomatic left knee. She is a little bit nervous because a previous injection caused her quite a bit of pain. I also explained to her that definitive treatment for both knees is a total knee arthroplasty. She would like to think over her options. She will schedule a follow-up appointment with me if she would like to try the cortisone injection and she will simply call if she would like a referral to orthopedics.

## 2015-04-04 ENCOUNTER — Other Ambulatory Visit: Payer: Self-pay | Admitting: Obstetrics and Gynecology

## 2015-08-13 ENCOUNTER — Encounter: Payer: Self-pay | Admitting: Obstetrics and Gynecology

## 2015-08-13 ENCOUNTER — Ambulatory Visit (INDEPENDENT_AMBULATORY_CARE_PROVIDER_SITE_OTHER): Payer: Commercial Managed Care - HMO | Admitting: Obstetrics and Gynecology

## 2015-08-13 VITALS — BP 129/84 | HR 90 | Temp 98.3°F | Ht 69.0 in | Wt 307.5 lb

## 2015-08-13 DIAGNOSIS — Z1159 Encounter for screening for other viral diseases: Secondary | ICD-10-CM

## 2015-08-13 DIAGNOSIS — Z23 Encounter for immunization: Secondary | ICD-10-CM

## 2015-08-13 DIAGNOSIS — Z114 Encounter for screening for human immunodeficiency virus [HIV]: Secondary | ICD-10-CM | POA: Diagnosis not present

## 2015-08-13 DIAGNOSIS — Z0000 Encounter for general adult medical examination without abnormal findings: Secondary | ICD-10-CM

## 2015-08-13 DIAGNOSIS — I10 Essential (primary) hypertension: Secondary | ICD-10-CM

## 2015-08-13 DIAGNOSIS — M7540 Impingement syndrome of unspecified shoulder: Secondary | ICD-10-CM | POA: Insufficient documentation

## 2015-08-13 DIAGNOSIS — M7541 Impingement syndrome of right shoulder: Secondary | ICD-10-CM

## 2015-08-13 LAB — BASIC METABOLIC PANEL
BUN: 13 mg/dL (ref 7–25)
CHLORIDE: 105 mmol/L (ref 98–110)
CO2: 29 mmol/L (ref 20–31)
Calcium: 9.4 mg/dL (ref 8.6–10.4)
Creat: 0.81 mg/dL (ref 0.50–0.99)
GLUCOSE: 99 mg/dL (ref 65–99)
POTASSIUM: 3.9 mmol/L (ref 3.5–5.3)
SODIUM: 141 mmol/L (ref 135–146)

## 2015-08-13 LAB — HIV ANTIBODY (ROUTINE TESTING W REFLEX): HIV 1&2 Ab, 4th Generation: NONREACTIVE

## 2015-08-13 MED ORDER — METHYLPREDNISOLONE ACETATE 40 MG/ML IJ SUSP
40.0000 mg | Freq: Once | INTRAMUSCULAR | Status: AC
  Administered 2015-08-13: 40 mg via INTRA_ARTICULAR

## 2015-08-13 NOTE — Assessment & Plan Note (Signed)
Patient due for wellness exam. Has not been evaluated in 2 year. Obtained blood work this visit that included BMP, HCV and HIV screening. Flu vaccine also administered this visit.

## 2015-08-13 NOTE — Progress Notes (Signed)
    Subjective: Chief Complaint  Patient presents with  . Pain    bilateral upper arm pain    HPI: Sheila Hartman is a 64 y.o. presenting to clinic today to discuss the following:  #Arm Pain: -patient endorsing right arm and shoulder pain that started about 2 months ago -associating it with possible tambourine use as her most recent one is heavier than previous one -has history of torn left rotator cuff in 2014 - treated with nitro-paatches -states that this pain does not feel like that -has some associated pain that goes to the elbow -denies neuropathy symptoms -pain is sharp and intermittent -denies any injuries, no masses felt, no swelling, bruising, or erythema -she is right hand dominant -denies fevers, weakness  #Hypertension: Blood pressure at home: Does not check but gets it checked at church sometimes and it is well cotnrolled Blood pressure today: within goal Taking Meds: yes  Side effects: None ROS: Denies headache, dizziness, visual changes, nausea, vomiting, chest pain, abdominal pain or shortness of breath.  Health Maintenance: Due for labs and vaccines  ROS in HPI.  Past Medical, Surgical, Social, and Family History Reviewed & Updated per EMR.   Objective: BP 129/84 mmHg  Pulse 90  Temp(Src) 98.3 F (36.8 C) (Oral)  Ht 5\' 9"  (1.753 m)  Wt 307 lb 8 oz (139.481 kg)  BMI 45.39 kg/m2  Physical Exam  Constitutional: She is well-developed, well-nourished, and in no distress.  Cardiovascular: Normal rate, regular rhythm, normal heart sounds and intact distal pulses.   Pulmonary/Chest: Effort normal and breath sounds normal.  Musculoskeletal: She exhibits no edema.  Right Shoulder: Inspection reveals no abnormalities, atrophy or asymmetry. Palpation with tenderness in subacromial space and acromion ROM is full in all planes with some associated discomfort with overhead activity Rotator cuff strength normal throughout. Signs of impingement with  positive Neer and Hawkin's tests, empty can sign.   Assessment/Plan: Please see problem based Assessment and Plan   Orders Placed This Encounter  Procedures  . Flu Vaccine QUAD 36+ mos IM  . Basic Metabolic Panel  . Hepatitis C antibody  . HIV antibody    Luiz Blare, DO 08/13/2015, 8:42 AM PGY-2, [place]

## 2015-08-13 NOTE — Patient Instructions (Signed)
Treatment at home:  Ice may decrease acute swelling and inflammation and provide some analgesia.  Rest - This means avoiding activities that aggravate symptoms, including all overhead activities.  NSAIDs - like ibuprofen   Impingement Syndrome, Rotator Cuff, Bursitis With Rehab Impingement syndrome is a condition that involves inflammation of the tendons of the rotator cuff and the subacromial bursa, that causes pain in the shoulder. The rotator cuff consists of four tendons and muscles that control much of the shoulder and upper arm function. The subacromial bursa is a fluid filled sac that helps reduce friction between the rotator cuff and one of the bones of the shoulder (acromion). Impingement syndrome is usually an overuse injury that causes swelling of the bursa (bursitis), swelling of the tendon (tendonitis), and/or a tear of the tendon (strain). Strains are classified into three categories. Grade 1 strains cause pain, but the tendon is not lengthened. Grade 2 strains include a lengthened ligament, due to the ligament being stretched or partially ruptured. With grade 2 strains there is still function, although the function may be decreased. Grade 3 strains include a complete tear of the tendon or muscle, and function is usually impaired. SYMPTOMS   Pain around the shoulder, often at the outer portion of the upper arm.  Pain that gets worse with shoulder function, especially when reaching overhead or lifting.  Sometimes, aching when not using the arm.  Pain that wakes you up at night.  Sometimes, tenderness, swelling, warmth, or redness over the affected area.  Loss of strength.  Limited motion of the shoulder, especially reaching behind the back (to the back pocket or to unhook bra) or across your body.  Crackling sound (crepitation) when moving the arm.  Biceps tendon pain and inflammation (in the front of the shoulder). Worse when bending the elbow or lifting. CAUSES    Impingement syndrome is often an overuse injury, in which chronic (repetitive) motions cause the tendons or bursa to become inflamed. A strain occurs when a force is paced on the tendon or muscle that is greater than it can withstand. Common mechanisms of injury include: Stress from sudden increase in duration, frequency, or intensity of training.  Direct hit (trauma) to the shoulder.  Aging, erosion of the tendon with normal use.  Bony bump on shoulder (acromial spur). RISK INCREASES WITH:  Contact sports (football, wrestling, boxing).  Throwing sports (baseball, tennis, volleyball).  Weightlifting and bodybuilding.  Heavy labor.  Previous injury to the rotator cuff, including impingement.  Poor shoulder strength and flexibility.  Failure to warm up properly before activity.  Inadequate protective equipment.  Old age.  Bony bump on shoulder (acromial spur). PREVENTION   Warm up and stretch properly before activity.  Allow for adequate recovery between workouts.  Maintain physical fitness:  Strength, flexibility, and endurance.  Cardiovascular fitness.  Learn and use proper exercise technique. PROGNOSIS  If treated properly, impingement syndrome usually goes away within 6 weeks. Sometimes surgery is required.  RELATED COMPLICATIONS   Longer healing time if not properly treated, or if not given enough time to heal.  Recurring symptoms, that result in a chronic condition.  Shoulder stiffness, frozen shoulder, or loss of motion.  Rotator cuff tendon tear.  Recurring symptoms, especially if activity is resumed too soon, with overuse, with a direct blow, or when using poor technique. TREATMENT  Treatment first involves the use of ice and medicine, to reduce pain and inflammation. The use of strengthening and stretching exercises may help reduce pain with  activity. These exercises may be performed at home or with a therapist. If non-surgical treatment is  unsuccessful after more than 6 months, surgery may be advised. After surgery and rehabilitation, activity is usually possible in 3 months.  MEDICATION  If pain medicine is needed, nonsteroidal anti-inflammatory medicines (aspirin and ibuprofen), or other minor pain relievers (acetaminophen), are often advised.  Do not take pain medicine for 7 days before surgery.  Prescription pain relievers may be given, if your caregiver thinks they are needed. Use only as directed and only as much as you need.  Corticosteroid injections may be given by your caregiver. These injections should be reserved for the most serious cases, because they may only be given a certain number of times. HEAT AND COLD  Cold treatment (icing) should be applied for 10 to 15 minutes every 2 to 3 hours for inflammation and pain, and immediately after activity that aggravates your symptoms. Use ice packs or an ice massage.  Heat treatment may be used before performing stretching and strengthening activities prescribed by your caregiver, physical therapist, or athletic trainer. Use a heat pack or a warm water soak. SEEK MEDICAL CARE IF:   Symptoms get worse or do not improve in 4 to 6 weeks, despite treatment.  New, unexplained symptoms develop. (Drugs used in treatment may produce side effects.) EXERCISES  RANGE OF MOTION (ROM) AND STRETCHING EXERCISES - Impingement Syndrome (Rotator Cuff  Tendinitis, Bursitis) These exercises may help you when beginning to rehabilitate your injury. Your symptoms may go away with or without further involvement from your physician, physical therapist or athletic trainer. While completing these exercises, remember:   Restoring tissue flexibility helps normal motion to return to the joints. This allows healthier, less painful movement and activity.  An effective stretch should be held for at least 30 seconds.  A stretch should never be painful. You should only feel a gentle lengthening or  release in the stretched tissue. STRETCH - Flexion, Standing  Stand with good posture. With an underhand grip on your right / left hand, and an overhand grip on the opposite hand, grasp a broomstick or cane so that your hands are a little more than shoulder width apart.  Keeping your right / left elbow straight and shoulder muscles relaxed, push the stick with your opposite hand, to raise your right / left arm in front of your body and then overhead. Raise your arm until you feel a stretch in your right / left shoulder, but before you have increased shoulder pain.  Try to avoid shrugging your right / left shoulder as your arm rises, by keeping your shoulder blade tucked down and toward your mid-back spine. Hold for __________ seconds.  Slowly return to the starting position. Repeat __________ times. Complete this exercise __________ times per day. STRETCH - Abduction, Supine  Lie on your back. With an underhand grip on your right / left hand and an overhand grip on the opposite hand, grasp a broomstick or cane so that your hands are a little more than shoulder width apart.  Keeping your right / left elbow straight and your shoulder muscles relaxed, push the stick with your opposite hand, to raise your right / left arm out to the side of your body and then overhead. Raise your arm until you feel a stretch in your right / left shoulder, but before you have increased shoulder pain.  Try to avoid shrugging your right / left shoulder as your arm rises, by keeping your shoulder  blade tucked down and toward your mid-back spine. Hold for __________ seconds.  Slowly return to the starting position. Repeat __________ times. Complete this exercise __________ times per day. ROM - Flexion, Active-Assisted  Lie on your back. You may bend your knees for comfort.  Grasp a broomstick or cane so your hands are about shoulder width apart. Your right / left hand should grip the end of the stick, so that your  hand is positioned "thumbs-up," as if you were about to shake hands.  Using your healthy arm to lead, raise your right / left arm overhead, until you feel a gentle stretch in your shoulder. Hold for __________ seconds.  Use the stick to assist in returning your right / left arm to its starting position. Repeat __________ times. Complete this exercise __________ times per day.  ROM - Internal Rotation, Supine   Lie on your back on a firm surface. Place your right / left elbow about 60 degrees away from your side. Elevate your elbow with a folded towel, so that the elbow and shoulder are the same height.  Using a broomstick or cane and your strong arm, pull your right / left hand toward your body until you feel a gentle stretch, but no increase in your shoulder pain. Keep your shoulder and elbow in place throughout the exercise.  Hold for __________ seconds. Slowly return to the starting position. Repeat __________ times. Complete this exercise __________ times per day. STRETCH - Internal Rotation  Place your right / left hand behind your back, palm up.  Throw a towel or belt over your opposite shoulder. Grasp the towel with your right / left hand.  While keeping an upright posture, gently pull up on the towel, until you feel a stretch in the front of your right / left shoulder.  Avoid shrugging your right / left shoulder as your arm rises, by keeping your shoulder blade tucked down and toward your mid-back spine.  Hold for __________ seconds. Release the stretch, by lowering your healthy hand. Repeat __________ times. Complete this exercise __________ times per day. ROM - Internal Rotation   Using an underhand grip, grasp a stick behind your back with both hands.  While standing upright with good posture, slide the stick up your back until you feel a mild stretch in the front of your shoulder.  Hold for __________ seconds. Slowly return to your starting position. Repeat __________  times. Complete this exercise __________ times per day.  STRETCH - Posterior Shoulder Capsule   Stand or sit with good posture. Grasp your right / left elbow and draw it across your chest, keeping it at the same height as your shoulder.  Pull your elbow, so your upper arm comes in closer to your chest. Pull until you feel a gentle stretch in the back of your shoulder.  Hold for __________ seconds. Repeat __________ times. Complete this exercise __________ times per day. STRENGTHENING EXERCISES - Impingement Syndrome (Rotator Cuff Tendinitis, Bursitis) These exercises may help you when beginning to rehabilitate your injury. They may resolve your symptoms with or without further involvement from your physician, physical therapist or athletic trainer. While completing these exercises, remember:  Muscles can gain both the endurance and the strength needed for everyday activities through controlled exercises.  Complete these exercises as instructed by your physician, physical therapist or athletic trainer. Increase the resistance and repetitions only as guided.  You may experience muscle soreness or fatigue, but the pain or discomfort you are trying to  eliminate should never worsen during these exercises. If this pain does get worse, stop and make sure you are following the directions exactly. If the pain is still present after adjustments, discontinue the exercise until you can discuss the trouble with your clinician.  During your recovery, avoid activity or exercises which involve actions that place your injured hand or elbow above your head or behind your back or head. These positions stress the tissues which you are trying to heal. STRENGTH - Scapular Depression and Adduction   With good posture, sit on a firm chair. Support your arms in front of you, with pillows, arm rests, or on a table top. Have your elbows in line with the sides of your body.  Gently draw your shoulder blades down and  toward your mid-back spine. Gradually increase the tension, without tensing the muscles along the top of your shoulders and the back of your neck.  Hold for __________ seconds. Slowly release the tension and relax your muscles completely before starting the next repetition.  After you have practiced this exercise, remove the arm support and complete the exercise in standing as well as sitting position. Repeat __________ times. Complete this exercise __________ times per day.  STRENGTH - Shoulder Abductors, Isometric  With good posture, stand or sit about 4-6 inches from a wall, with your right / left side facing the wall.  Bend your right / left elbow. Gently press your right / left elbow into the wall. Increase the pressure gradually, until you are pressing as hard as you can, without shrugging your shoulder or increasing any shoulder discomfort.  Hold for __________ seconds.  Release the tension slowly. Relax your shoulder muscles completely before you begin the next repetition. Repeat __________ times. Complete this exercise __________ times per day.  STRENGTH - External Rotators, Isometric  Keep your right / left elbow at your side and bend it 90 degrees.  Step into a door frame so that the outside of your right / left wrist can press against the door frame without your upper arm leaving your side.  Gently press your right / left wrist into the door frame, as if you were trying to swing the back of your hand away from your stomach. Gradually increase the tension, until you are pressing as hard as you can, without shrugging your shoulder or increasing any shoulder discomfort.  Hold for __________ seconds.  Release the tension slowly. Relax your shoulder muscles completely before you begin the next repetition. Repeat __________ times. Complete this exercise __________ times per day.  STRENGTH - Supraspinatus   Stand or sit with good posture. Grasp a __________ weight, or an exercise  band or tubing, so that your hand is "thumbs-up," like you are shaking hands.  Slowly lift your right / left arm in a "V" away from your thigh, diagonally into the space between your side and straight ahead. Lift your hand to shoulder height or as far as you can, without increasing any shoulder pain. At first, many people do not lift their hands above shoulder height.  Avoid shrugging your right / left shoulder as your arm rises, by keeping your shoulder blade tucked down and toward your mid-back spine.  Hold for __________ seconds. Control the descent of your hand, as you slowly return to your starting position. Repeat __________ times. Complete this exercise __________ times per day.  STRENGTH - External Rotators  Secure a rubber exercise band or tubing to a fixed object (table, pole) so that it  is at the same height as your right / left elbow when you are standing or sitting on a firm surface.  Stand or sit so that the secured exercise band is at your uninjured side.  Bend your right / left elbow 90 degrees. Place a folded towel or small pillow under your right / left arm, so that your elbow is a few inches away from your side.  Keeping the tension on the exercise band, pull it away from your body, as if pivoting on your elbow. Be sure to keep your body steady, so that the movement is coming only from your rotating shoulder.  Hold for __________ seconds. Release the tension in a controlled manner, as you return to the starting position. Repeat __________ times. Complete this exercise __________ times per day.  STRENGTH - Internal Rotators   Secure a rubber exercise band or tubing to a fixed object (table, pole) so that it is at the same height as your right / left elbow when you are standing or sitting on a firm surface.  Stand or sit so that the secured exercise band is at your right / left side.  Bend your elbow 90 degrees. Place a folded towel or small pillow under your right / left  arm so that your elbow is a few inches away from your side.  Keeping the tension on the exercise band, pull it across your body, toward your stomach. Be sure to keep your body steady, so that the movement is coming only from your rotating shoulder.  Hold for __________ seconds. Release the tension in a controlled manner, as you return to the starting position. Repeat __________ times. Complete this exercise __________ times per day.  STRENGTH - Scapular Protractors, Standing   Stand arms length away from a wall. Place your hands on the wall, keeping your elbows straight.  Begin by dropping your shoulder blades down and toward your mid-back spine.  To strengthen your protractors, keep your shoulder blades down, but slide them forward on your rib cage. It will feel as if you are lifting the back of your rib cage away from the wall. This is a subtle motion and can be challenging to complete. Ask your caregiver for further instruction, if you are not sure you are doing the exercise correctly.  Hold for __________ seconds. Slowly return to the starting position, resting the muscles completely before starting the next repetition. Repeat __________ times. Complete this exercise __________ times per day. STRENGTH - Scapular Protractors, Supine  Lie on your back on a firm surface. Extend your right / left arm straight into the air while holding a __________ weight in your hand.  Keeping your head and back in place, lift your shoulder off the floor.  Hold for __________ seconds. Slowly return to the starting position, and allow your muscles to relax completely before starting the next repetition. Repeat __________ times. Complete this exercise __________ times per day. STRENGTH - Scapular Protractors, Quadruped  Get onto your hands and knees, with your shoulders directly over your hands (or as close as you can be, comfortably).  Keeping your elbows locked, lift the back of your rib cage up into  your shoulder blades, so your mid-back rounds out. Keep your neck muscles relaxed.  Hold this position for __________ seconds. Slowly return to the starting position and allow your muscles to relax completely before starting the next repetition. Repeat __________ times. Complete this exercise __________ times per day.  STRENGTH - Scapular Retractors  Secure a rubber exercise band or tubing to a fixed object (table, pole), so that it is at the height of your shoulders when you are either standing, or sitting on a firm armless chair.  With a palm down grip, grasp an end of the band in each hand. Straighten your elbows and lift your hands straight in front of you, at shoulder height. Step back, away from the secured end of the band, until it becomes tense.  Squeezing your shoulder blades together, draw your elbows back toward your sides, as you bend them. Keep your upper arms lifted away from your body throughout the exercise.  Hold for __________ seconds. Slowly ease the tension on the band, as you reverse the directions and return to the starting position. Repeat __________ times. Complete this exercise __________ times per day. STRENGTH - Shoulder Extensors   Secure a rubber exercise band or tubing to a fixed object (table, pole) so that it is at the height of your shoulders when you are either standing, or sitting on a firm armless chair.  With a thumbs-up grip, grasp an end of the band in each hand. Straighten your elbows and lift your hands straight in front of you, at shoulder height. Step back, away from the secured end of the band, until it becomes tense.  Squeezing your shoulder blades together, pull your hands down to the sides of your thighs. Do not allow your hands to go behind you.  Hold for __________ seconds. Slowly ease the tension on the band, as you reverse the directions and return to the starting position. Repeat __________ times. Complete this exercise __________ times per  day.  STRENGTH - Scapular Retractors and External Rotators   Secure a rubber exercise band or tubing to a fixed object (table, pole) so that it is at the height as your shoulders, when you are either standing, or sitting on a firm armless chair.  With a palm down grip, grasp an end of the band in each hand. Bend your elbows 90 degrees and lift your elbows to shoulder height, at your sides. Step back, away from the secured end of the band, until it becomes tense.  Squeezing your shoulder blades together, rotate your shoulders so that your upper arms and elbows remain stationary, but your fists travel upward to head height.  Hold for __________ seconds. Slowly ease the tension on the band, as you reverse the directions and return to the starting position. Repeat __________ times. Complete this exercise __________ times per day.  STRENGTH - Scapular Retractors and External Rotators, Rowing   Secure a rubber exercise band or tubing to a fixed object (table, pole) so that it is at the height of your shoulders, when you are either standing, or sitting on a firm armless chair.  With a palm down grip, grasp an end of the band in each hand. Straighten your elbows and lift your hands straight in front of you, at shoulder height. Step back, away from the secured end of the band, until it becomes tense.  Step 1: Squeeze your shoulder blades together. Bending your elbows, draw your hands to your chest, as if you are rowing a boat. At the end of this motion, your hands and elbow should be at shoulder height and your elbows should be out to your sides.  Step 2: Rotate your shoulders, to raise your hands above your head. Your forearms should be vertical and your upper arms should be horizontal.  Hold for __________ seconds.  Slowly ease the tension on the band, as you reverse the directions and return to the starting position. Repeat __________ times. Complete this exercise __________ times per day.  STRENGTH  - Scapular Depressors  Find a sturdy chair without wheels, such as a dining room chair.  Keeping your feet on the floor, and your hands on the chair arms, lift your bottom up from the seat, and lock your elbows.  Keeping your elbows straight, allow gravity to pull your body weight down. Your shoulders will rise toward your ears.  Raise your body against gravity by drawing your shoulder blades down your back, shortening the distance between your shoulders and ears. Although your feet should always maintain contact with the floor, your feet should progressively support less body weight, as you get stronger.  Hold for __________ seconds. In a controlled and slow manner, lower your body weight to begin the next repetition. Repeat __________ times. Complete this exercise __________ times per day.    This information is not intended to replace advice given to you by your health care provider. Make sure you discuss any questions you have with your health care provider.   Document Released: 09/06/2005 Document Revised: 09/27/2014 Document Reviewed: 12/19/2008 [employer] Interactive Patient Education [employer].

## 2015-08-13 NOTE — Assessment & Plan Note (Signed)
At goal, controlled. No adjustments made to regimen. Will obtain blood work today.

## 2015-08-13 NOTE — Assessment & Plan Note (Signed)
Symptoms and physical consistent with shoulder impingement. Patient without associated weakness or neuropathy. No signs of RC tear.  -shoulder injection today -handout given with exercises -encouraged resting, icing, and taking NSAIDs for pain relief and management -RTC if symptoms not improved

## 2015-08-14 LAB — HEPATITIS C ANTIBODY: HCV Ab: NEGATIVE

## 2015-08-16 ENCOUNTER — Encounter (HOSPITAL_COMMUNITY): Payer: Self-pay

## 2015-08-16 ENCOUNTER — Emergency Department (HOSPITAL_COMMUNITY): Payer: Commercial Managed Care - HMO

## 2015-08-16 ENCOUNTER — Emergency Department (HOSPITAL_COMMUNITY)
Admission: EM | Admit: 2015-08-16 | Discharge: 2015-08-16 | Disposition: A | Discharge status: Home / Self Care | Payer: Commercial Managed Care - HMO | Attending: Emergency Medicine | Admitting: Emergency Medicine

## 2015-08-16 DIAGNOSIS — Z880 Allergy status to penicillin: Secondary | ICD-10-CM | POA: Insufficient documentation

## 2015-08-16 DIAGNOSIS — Z8669 Personal history of other diseases of the nervous system and sense organs: Secondary | ICD-10-CM | POA: Diagnosis not present

## 2015-08-16 DIAGNOSIS — Z8719 Personal history of other diseases of the digestive system: Secondary | ICD-10-CM | POA: Diagnosis not present

## 2015-08-16 DIAGNOSIS — R1031 Right lower quadrant pain: Secondary | ICD-10-CM | POA: Diagnosis present

## 2015-08-16 DIAGNOSIS — Z791 Long term (current) use of non-steroidal anti-inflammatories (NSAID): Secondary | ICD-10-CM | POA: Diagnosis not present

## 2015-08-16 DIAGNOSIS — Z87891 Personal history of nicotine dependence: Secondary | ICD-10-CM | POA: Diagnosis not present

## 2015-08-16 DIAGNOSIS — I10 Essential (primary) hypertension: Secondary | ICD-10-CM | POA: Insufficient documentation

## 2015-08-16 DIAGNOSIS — Z7951 Long term (current) use of inhaled steroids: Secondary | ICD-10-CM | POA: Diagnosis not present

## 2015-08-16 DIAGNOSIS — Z862 Personal history of diseases of the blood and blood-forming organs and certain disorders involving the immune mechanism: Secondary | ICD-10-CM | POA: Diagnosis not present

## 2015-08-16 DIAGNOSIS — Z79899 Other long term (current) drug therapy: Secondary | ICD-10-CM | POA: Insufficient documentation

## 2015-08-16 DIAGNOSIS — M1611 Unilateral primary osteoarthritis, right hip: Secondary | ICD-10-CM | POA: Diagnosis not present

## 2015-08-16 DIAGNOSIS — N201 Calculus of ureter: Secondary | ICD-10-CM | POA: Insufficient documentation

## 2015-08-16 DIAGNOSIS — Z8659 Personal history of other mental and behavioral disorders: Secondary | ICD-10-CM | POA: Diagnosis not present

## 2015-08-16 DIAGNOSIS — R109 Unspecified abdominal pain: Secondary | ICD-10-CM

## 2015-08-16 DIAGNOSIS — Z8673 Personal history of transient ischemic attack (TIA), and cerebral infarction without residual deficits: Secondary | ICD-10-CM | POA: Insufficient documentation

## 2015-08-16 LAB — URINE MICROSCOPIC-ADD ON
RBC / HPF: NONE SEEN RBC/hpf (ref 0–5)
WBC, UA: NONE SEEN WBC/hpf (ref 0–5)

## 2015-08-16 LAB — COMPREHENSIVE METABOLIC PANEL
ALT: 13 U/L — AB (ref 14–54)
AST: 15 U/L (ref 15–41)
Albumin: 4.1 g/dL (ref 3.5–5.0)
Alkaline Phosphatase: 93 U/L (ref 38–126)
Anion gap: 7 (ref 5–15)
BUN: 23 mg/dL — ABNORMAL HIGH (ref 6–20)
CHLORIDE: 107 mmol/L (ref 101–111)
CO2: 27 mmol/L (ref 22–32)
CREATININE: 1.18 mg/dL — AB (ref 0.44–1.00)
Calcium: 9.7 mg/dL (ref 8.9–10.3)
GFR calc non Af Amer: 48 mL/min — ABNORMAL LOW (ref >60)
GFR, EST AFRICAN AMERICAN: 56 mL/min — AB (ref >60)
Glucose, Bld: 137 mg/dL — ABNORMAL HIGH (ref 65–99)
POTASSIUM: 3.7 mmol/L (ref 3.5–5.1)
SODIUM: 141 mmol/L (ref 135–145)
Total Bilirubin: 0.5 mg/dL (ref 0.3–1.2)
Total Protein: 6.9 g/dL (ref 6.5–8.1)

## 2015-08-16 LAB — CBC
HCT: 38.2 % (ref 36.0–46.0)
HEMOGLOBIN: 11.7 g/dL — AB (ref 12.0–15.0)
MCH: 23.9 pg — AB (ref 26.0–34.0)
MCHC: 30.6 g/dL (ref 30.0–36.0)
MCV: 78.1 fL (ref 78.0–100.0)
PLATELETS: 302 10*3/uL (ref 150–400)
RBC: 4.89 MIL/uL (ref 3.87–5.11)
RDW: 15.7 % — ABNORMAL HIGH (ref 11.5–15.5)
WBC: 12.3 10*3/uL — ABNORMAL HIGH (ref 4.0–10.5)

## 2015-08-16 LAB — URINALYSIS, ROUTINE W REFLEX MICROSCOPIC
Bilirubin Urine: NEGATIVE
GLUCOSE, UA: NEGATIVE mg/dL
Ketones, ur: NEGATIVE mg/dL
LEUKOCYTES UA: NEGATIVE
Nitrite: NEGATIVE
PH: 5 (ref 5.0–8.0)
Protein, ur: NEGATIVE mg/dL
SPECIFIC GRAVITY, URINE: 1.028 (ref 1.005–1.030)

## 2015-08-16 MED ORDER — OXYCODONE-ACETAMINOPHEN 5-325 MG PO TABS
1.0000 | ORAL_TABLET | ORAL | Status: DC | PRN
Start: 2015-08-16 — End: 2016-05-11

## 2015-08-16 MED ORDER — NAPROXEN SODIUM 220 MG PO TABS: 220.0000 mg | ORAL_TABLET | Freq: Two times a day (BID) | ORAL | Status: DC | PRN

## 2015-08-16 MED ORDER — KETOROLAC TROMETHAMINE 30 MG/ML IJ SOLN
30.0000 mg | Freq: Once | INTRAMUSCULAR | Status: AC
  Administered 2015-08-16: 30 mg via INTRAVENOUS
  Filled 2015-08-16: qty 1

## 2015-08-16 MED ORDER — ONDANSETRON HCL 4 MG/2ML IJ SOLN
4.0000 mg | Freq: Once | INTRAMUSCULAR | Status: AC
  Administered 2015-08-16: 4 mg via INTRAVENOUS
  Filled 2015-08-16: qty 2

## 2015-08-16 MED ORDER — ONDANSETRON 8 MG PO TBDP: 8.0000 mg | ORAL_TABLET | Freq: Three times a day (TID) | ORAL | Status: DC | PRN

## 2015-08-16 MED ORDER — MORPHINE SULFATE (PF) 4 MG/ML IV SOLN
6.0000 mg | INTRAVENOUS | Status: DC | PRN
  Administered 2015-08-16 (×2): 6 mg via INTRAVENOUS
  Filled 2015-08-16 (×2): qty 2

## 2015-08-16 NOTE — ED Notes (Signed)
Patient transported to CT 

## 2015-08-16 NOTE — ED Notes (Signed)
Pt here for right flank pain onset when asleep woke her up, denies hx of renal stones.
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
## 2015-08-16 NOTE — Discharge Instructions (Signed)
Renal Colic  Renal colic is pain that is caused by passing a kidney stone. The pain can be sharp and severe. It may be felt in the back, abdomen, side (flank), or groin. It can cause nausea. Renal colic can come and go.  HOME CARE INSTRUCTIONS  Watch your condition for any changes. The following actions may help to lessen any discomfort that you are feeling:  · Take medicines only as directed by your health care provider.  · Ask your health care provider if it is okay to take over-the-counter pain medicine.  · Drink enough fluid to keep your urine clear or pale yellow. Drink 6-8 glasses of water each day.  · Limit the amount of salt that you eat to less than 2 grams per day.  · Reduce the amount of protein in your diet. Eat less meat, fish, nuts, and dairy.  · Avoid foods such as spinach, rhubarb, nuts, or bran. These may make kidney stones more likely to form.  SEEK MEDICAL CARE IF:  · You have a fever or chills.  · Your urine smells bad or looks cloudy.  · You have pain or burning when you pass urine.  SEEK IMMEDIATE MEDICAL CARE IF:  · Your flank pain or groin pain suddenly worsens.  · You become confused or disoriented or you lose consciousness.     This information is not intended to replace advice given to you by your health care provider. Make sure you discuss any questions you have with your health care provider.     Document Released: 06/16/2005 Document Revised: 09/27/2014 Document Reviewed: 07/17/2014  Elsevier Interactive Patient Education ©2016 [employer]

## 2015-08-18 ENCOUNTER — Encounter: Payer: Self-pay | Admitting: Obstetrics and Gynecology

## 2015-08-18 ENCOUNTER — Telehealth: Payer: Self-pay | Admitting: Obstetrics and Gynecology

## 2015-08-18 DIAGNOSIS — N132 Hydronephrosis with renal and ureteral calculous obstruction: Secondary | ICD-10-CM

## 2015-08-18 NOTE — Telephone Encounter (Signed)
Pt called and needs a  Referral to see a urologist. Sheila Hartman

## 2015-08-18 NOTE — Telephone Encounter (Signed)
Will forward to PCP for review of referral to urologist. Please forward response back to [facility]. Rosemaria Inabinet, CMA.

## 2015-08-19 NOTE — Telephone Encounter (Signed)
Referral to urologist placed as urgent since she has obstructing stone. Unsure why referral was not made in ED.

## 2015-08-19 NOTE — Addendum Note (Signed)
Addended by: Katheren Shams on: 08/19/2015 08:41 AM   Modules accepted: Orders

## 2015-09-17 ENCOUNTER — Telehealth: Payer: Self-pay | Admitting: Obstetrics and Gynecology

## 2015-09-17 DIAGNOSIS — Z7689 Persons encountering health services in other specified circumstances: Secondary | ICD-10-CM

## 2015-09-17 NOTE — Telephone Encounter (Signed)
Pt called because she has an appointment with Dr. Adonis Brook at [place] for 09/18/15 at 8:45 am. The issues is she now needs a referral from [place] to be seen. Can we do this by tomorrow? If not please inform patient so that she can reschedule this appointment. jw

## 2015-09-17 NOTE — Telephone Encounter (Signed)
Will forward to MD to place referral for this. Jazmin Hartsell,CMA  

## 2015-09-18 NOTE — Telephone Encounter (Signed)
Referral to Ophthalmology placed. 

## 2015-10-01 ENCOUNTER — Encounter: Payer: Self-pay | Admitting: Obstetrics and Gynecology

## 2015-10-01 ENCOUNTER — Ambulatory Visit (INDEPENDENT_AMBULATORY_CARE_PROVIDER_SITE_OTHER): Payer: Commercial Managed Care - HMO | Admitting: Obstetrics and Gynecology

## 2015-10-01 VITALS — BP 140/72 | HR 99 | Temp 98.4°F | Wt 301.4 lb

## 2015-10-01 DIAGNOSIS — M7541 Impingement syndrome of right shoulder: Secondary | ICD-10-CM | POA: Diagnosis not present

## 2015-10-01 DIAGNOSIS — Z09 Encounter for follow-up examination after completed treatment for conditions other than malignant neoplasm: Secondary | ICD-10-CM | POA: Diagnosis not present

## 2015-10-01 DIAGNOSIS — D179 Benign lipomatous neoplasm, unspecified: Secondary | ICD-10-CM | POA: Diagnosis not present

## 2015-10-01 MED ORDER — IBUPROFEN 600 MG PO TABS: 600.0000 mg | ORAL_TABLET | Freq: Three times a day (TID) | ORAL | Status: DC | PRN

## 2015-10-01 MED ORDER — AMLODIPINE BESY-BENAZEPRIL HCL 5-20 MG PO CAPS: 1.0000 | ORAL_CAPSULE | Freq: Every day | ORAL | Status: DC

## 2015-10-01 NOTE — Patient Instructions (Signed)
Here are some of the things we discussed today: -You are going to go over to sports medicine to try and schedule an appointment -Can come back next month for another injection -Holding off on imaging -ibuprofen prescribed to use -opthalmology referral placed to Dr. Posey Pronto -REST shoulder  New medications: Ibuprofen 600mg    Please schedule a follow-up appointment for 6 weeks    Thanks for allowing me to be a part of your care! Dr. Gerarda Fraction

## 2015-10-02 ENCOUNTER — Telehealth: Payer: Self-pay | Admitting: Obstetrics and Gynecology

## 2015-10-02 NOTE — Progress Notes (Signed)
     Subjective: Chief Complaint  Patient presents with  . Follow-up    Right arm pain     HPI: Sheila Hartman is a 65 y.o. presenting to clinic today to discuss the following:  #R. Shoulder Pain: -continues to endorse right shoulder pain -RHD -has been bothering her for years -had steroid injection about two months ago; patient states effects lasted about one month then got worse again -describes the pain as severe and stabbing at times -no decreased sensation or neuropathy associated with it -pain worse at night; tries not to sleep on arm -also noticed new knot in arm  #ED Follow-Up: - patient was diagnosed with nephrolithiasis - has since passed kidney stone - no longer having abdominal pian, dysuria - states she did not take any medications because she doesn't like to -had renal follow-up for AKI with kidney stones which has since improved   ROS noted in HPI.  Past Medical, Surgical, Social, and Family History Reviewed & Updated per EMR.   Objective: BP 140/72 mmHg  Pulse 99  Temp(Src) 98.4 F (36.9 C) (Oral)  Wt 301 lb 6.4 oz (136.714 kg) Vitals and nurse note reviewed   Physical Exam  Constitutional: She is well-developed, well-nourished, and in no distress.  Musculoskeletal:       Right shoulder: She exhibits decreased range of motion and pain. She exhibits no tenderness, no swelling, no deformity, normal pulse and normal strength.  R. Shoulder: Inspection reveals no abnormalities, atrophy or asymmetry. Palpation is normal with no tenderness over AC joint or bicipital groove. Weak subscapularis. Signs of impingement with positive Neer and Hawkin's tests, empty can sign.  Well demarcated 3cm mass appreciated on right arm. Nontender and slightly doughy, moves readily with slight finger pressure. Does not feel adhered to any structures.     Assessment/Plan: Please see problem based Assessment and Plan   Meds ordered this encounter  Medications  .  ibuprofen (ADVIL,MOTRIN) 600 MG tablet    Sig: Take 1 tablet (600 mg total) by mouth every 8 (eight) hours as needed.    Dispense:  30 tablet    Refill:  0  . amLODipine-benazepril (LOTREL) 5-20 MG capsule    Sig: Take 1 capsule by mouth daily.    Dispense:  90 capsule    Refill:  [place], DO 10/02/2015, 10:04 AM PGY-2, [place]

## 2015-10-02 NOTE — Telephone Encounter (Signed)
Just wanted dr to know Sheila Hartman will be sending a fax about pts medication coming to pt from [facility]

## 2015-10-02 NOTE — Telephone Encounter (Signed)
Will forward MD so she can be aware that fax is coming from [place] regarding patient's medications. Mekenna Finau,CMA

## 2015-10-03 NOTE — Assessment & Plan Note (Signed)
New lipoma appears to be on right arm near shoulder. Non-tender. About 4cm in diameter. Will monitor. No intervention. Patient has multiple lipomas diffusely.

## 2015-10-03 NOTE — Telephone Encounter (Signed)
-----   Message from Katheren Shams, DO sent at 10/03/2015  9:15 AM EST ----- At last office visit patient requested that her referral be changed to a different ophthalmologist. Please inform patient of the below message from [place] checked on [employer] website and ophthalmology is not in network with patient's Insurance [facility])." If she has further questions please have her first call her insurance to find out what is covered and/or what practices are in network for her then I can try referring again.

## 2015-10-03 NOTE — Telephone Encounter (Signed)
Spoke with patient regarding referral and she voiced understanding and will contact her insurance to check on who they suggest she see. Sheila Hartman,CMA

## 2015-10-03 NOTE — Assessment & Plan Note (Signed)
Continues to have right shoulder impingement syndrome which has been an ongoing problem for a while for the patient. No signs of RC tear. She has had RC tear on the left before. Management plan discussed with patient. She wants to hold off on imaging at this time(MRI). Will follow-up with Dr. Jonelle Sports in sports medicine clinic to see if she can get [place] diagnosis. Too early for another injection at this time. Can return next month if wants another. Patient to rest shoulder and try and do shoulder exercises given. Rx for ibuprofen 600mg . RTC in 6 weeks or sooner as needed.

## 2015-10-15 ENCOUNTER — Ambulatory Visit (INDEPENDENT_AMBULATORY_CARE_PROVIDER_SITE_OTHER): Payer: Commercial Managed Care - HMO | Admitting: Sports Medicine

## 2015-10-15 ENCOUNTER — Encounter: Payer: Self-pay | Admitting: Sports Medicine

## 2015-10-15 VITALS — BP 129/85 | Ht 69.0 in | Wt 300.0 lb

## 2015-10-15 DIAGNOSIS — S46001D Unspecified injury of muscle(s) and tendon(s) of the rotator cuff of right shoulder, subsequent encounter: Secondary | ICD-10-CM

## 2015-10-15 DIAGNOSIS — M25511 Pain in right shoulder: Secondary | ICD-10-CM

## 2015-10-15 MED ORDER — METHYLPREDNISOLONE ACETATE 40 MG/ML IJ SUSP
40.0000 mg | Freq: Once | INTRAMUSCULAR | Status: AC
  Administered 2015-10-15: 40 mg via INTRA_ARTICULAR

## 2015-10-15 NOTE — Progress Notes (Signed)
   Subjective:    Patient ID: Sheila Hartman, female    DOB: 1951/08/26, 64 y.o.   MRN: [id number]  HPI chief complaint: Right shoulder pain  Right-hand-dominant female comes in today complaining of 3 months of right shoulder pain. No trauma that she can recall but rather a gradual onset of pain that she localizes to her upper humerus. Pain is present with any activity that requires her to hold any sort of weight away from her body. She also has pain with repetitive overhead activity. Pain at times will radiate down to the elbow. No associated numbness or tingling. Pain will awaken her at night. She had a subacromial cortisone injection done in November which did give her about a month's worth of relief but has not had any other treatment. She takes an occasional Aleve which does help her sleep at night. She has a history of a left shoulder rotator cuff tear which was treated with topical nitroglycerin with good results. She has not had any imaging of the right shoulder. No prior shoulder surgery.  Interim medical history reviewed Medications reviewed Allergies reviewed    Review of Systems    as above Objective:   Physical Exam  Well-developed, well-nourished. No acute distress. Awake alert and oriented 3.  Right shoulder: Patient has good passive and active range of motion. No tenderness to palpation over the [facility] joint nor over the bicipital groove. 4/5 strength with resisted supraspinatus and external rotation. Good strength with resisted internal rotation. Positive empty can, positive Hawkins. No atrophy. Neurovascularly intact distally.      Assessment & Plan:  Right shoulder pain secondary to rotator cuff tear  Her weakness with supraspinatus and infraspinatus testing are strongly suggestive of a rotator cuff tear. I would like to try a second subacromial cortisone injection for pain relief and sent her to physical therapy. She will follow-up with me in 4 weeks. If symptoms  persist then I will ultrasound the right shoulder to quantify the degree of tearing present in anticipation of starting topical nitroglycerin once again. Patient would like to avoid MRIs and surgery if at all possible. She will avoid those activities that she knows will cause her pain. Continue with Aleve as needed. Call with questions or concerns prior to her follow-up visit.  Consent obtained and verified. Time-out conducted. Noted no overlying erythema, induration, or other signs of local infection. Skin prepped in a sterile fashion. Topical analgesic spray: Ethyl chloride. Joint: Right shoulder subacromial injection Needle: 25g 1.5 inch Completed without difficulty. Meds: 3cc 1% xylocaine, 1cc (40mg ) depomedrol  Advised to call if fevers/chills, erythema, induration, drainage, or persistent bleeding.

## 2015-10-22 ENCOUNTER — Ambulatory Visit: Payer: Commercial Managed Care - HMO | Attending: Sports Medicine | Admitting: Physical Therapy

## 2015-10-22 DIAGNOSIS — M25611 Stiffness of right shoulder, not elsewhere classified: Secondary | ICD-10-CM

## 2015-10-22 DIAGNOSIS — R293 Abnormal posture: Secondary | ICD-10-CM | POA: Diagnosis present

## 2015-10-22 DIAGNOSIS — R29898 Other symptoms and signs involving the musculoskeletal system: Secondary | ICD-10-CM | POA: Insufficient documentation

## 2015-10-22 DIAGNOSIS — M25511 Pain in right shoulder: Secondary | ICD-10-CM
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
## 2015-10-22 NOTE — Patient Instructions (Signed)
   Welborn Keena PT, DPT, LAT, ATC  [facility] Phone: [phone]     

## 2015-10-24 ENCOUNTER — Ambulatory Visit: Payer: Commercial Managed Care - HMO | Admitting: Physical Therapy

## 2015-10-24 DIAGNOSIS — M25511 Pain in right shoulder: Secondary | ICD-10-CM | POA: Diagnosis not present

## 2015-10-24 DIAGNOSIS — R293 Abnormal posture: Secondary | ICD-10-CM

## 2015-10-24 DIAGNOSIS — R29898 Other symptoms and signs involving the musculoskeletal system: Secondary | ICD-10-CM

## 2015-10-24 DIAGNOSIS — M25611 Stiffness of right shoulder, not elsewhere classified: Secondary | ICD-10-CM
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
## 2015-10-27 ENCOUNTER — Ambulatory Visit: Payer: Commercial Managed Care - HMO

## 2015-10-27 DIAGNOSIS — M25611 Stiffness of right shoulder, not elsewhere classified: Secondary | ICD-10-CM

## 2015-10-27 DIAGNOSIS — M25511 Pain in right shoulder: Secondary | ICD-10-CM | POA: Diagnosis not present

## 2015-10-27 DIAGNOSIS — R293 Abnormal posture: Secondary | ICD-10-CM

## 2015-10-27 DIAGNOSIS — R29898 Other symptoms and signs involving the musculoskeletal system: Secondary | ICD-10-CM
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
## 2015-10-27 NOTE — Therapy (Deleted)
[place], [place], [address] Phone: [phone]   Fax:  [phone]  Patient Details  Name: Sheila Hartman MRN: [id number] Date of Birth: April 27, 1951 Referring Provider:  Katheren Shams, DO  Encounter Date: 10/27/2015   Dollene Cleveland , PT  10/27/2015, 10:17 AM  [facility] [address] [place], [place], [address] Phone: [phone]   Fax:  [phone]

## 2015-10-29 ENCOUNTER — Ambulatory Visit: Payer: Commercial Managed Care - HMO

## 2015-10-29 DIAGNOSIS — M25511 Pain in right shoulder: Secondary | ICD-10-CM

## 2015-10-29 DIAGNOSIS — R293 Abnormal posture: Secondary | ICD-10-CM

## 2015-10-29 DIAGNOSIS — R29898 Other symptoms and signs involving the musculoskeletal system: Secondary | ICD-10-CM

## 2015-10-29 DIAGNOSIS — M25611 Stiffness of right shoulder, not elsewhere classified: Secondary | ICD-10-CM
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
## 2015-11-03 ENCOUNTER — Encounter: Payer: Commercial Managed Care - HMO | Admitting: Physical Therapy

## 2015-11-05 ENCOUNTER — Ambulatory Visit: Payer: Commercial Managed Care - HMO | Admitting: Physical Therapy

## 2015-11-05 DIAGNOSIS — R29898 Other symptoms and signs involving the musculoskeletal system: Secondary | ICD-10-CM

## 2015-11-05 DIAGNOSIS — M25611 Stiffness of right shoulder, not elsewhere classified: Secondary | ICD-10-CM

## 2015-11-05 DIAGNOSIS — R293 Abnormal posture: Secondary | ICD-10-CM

## 2015-11-05 DIAGNOSIS — M25511 Pain in right shoulder: Secondary | ICD-10-CM
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
## 2015-11-10 ENCOUNTER — Ambulatory Visit: Payer: Commercial Managed Care - HMO | Admitting: Physical Therapy

## 2015-11-10 DIAGNOSIS — M25511 Pain in right shoulder: Secondary | ICD-10-CM | POA: Diagnosis not present

## 2015-11-10 DIAGNOSIS — R293 Abnormal posture: Secondary | ICD-10-CM

## 2015-11-10 DIAGNOSIS — R29898 Other symptoms and signs involving the musculoskeletal system: Secondary | ICD-10-CM

## 2015-11-10 DIAGNOSIS — M25611 Stiffness of right shoulder, not elsewhere classified: Secondary | ICD-10-CM
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
## 2015-11-12 ENCOUNTER — Encounter: Payer: Self-pay | Admitting: Sports Medicine

## 2015-11-12 ENCOUNTER — Ambulatory Visit (INDEPENDENT_AMBULATORY_CARE_PROVIDER_SITE_OTHER): Payer: Commercial Managed Care - HMO | Admitting: Sports Medicine

## 2015-11-12 VITALS — BP 137/80 | Ht 69.0 in | Wt 300.0 lb

## 2015-11-12 DIAGNOSIS — S46001D Unspecified injury of muscle(s) and tendon(s) of the rotator cuff of right shoulder, subsequent encounter: Secondary | ICD-10-CM

## 2015-11-12 MED ORDER — NITROGLYCERIN 0.2 MG/HR TD PT24: MEDICATED_PATCH | TRANSDERMAL | Status: DC

## 2015-11-12 NOTE — Patient Instructions (Signed)

## 2015-11-12 NOTE — Progress Notes (Signed)
   Subjective:    Patient ID: Sheila Hartman, female    DOB: Feb 01, 1951, 65 y.o.   MRN: [id number]  HPI   Patient comes in today for follow-up on right shoulder pain. Overall the pain persists. She states that she has some good days and some bad days. Pain is primarily along the lateral shoulder with radiating pain into the mid humerus. It is worse at night. She takes 2 over-the-counter Aleve to help her rest and she states that this does seem to help some. She has attended 6 total physical therapy visits with mixed results. She had a prior left shoulder rotator cuff injury which healed with topical nitroglycerin. Subacromial cortisone injection administered at her last office visit did not seem to help much.    Review of Systems As above    Objective:   Physical Exam  Well-developed, well-nourished. No acute distress. Vital signs reviewed  Right shoulder: Full range of motion with a positive painful arc. No tenderness to palpation over the acromioclavicular joint nor at the bicipital groove. Positive empty can, positive Hawkins. 4+5 strength with resisted supraspinatus with reproducible pain. 5/5 strength with resisted external rotation but this does cause pain. No weakness with resisted subscapularis. Neurovascularly intact distally.  MSK ultrasound of the right shoulder was performed. Limited images were obtained. Acromioclavicular joint shows a mushroom sign. Body habitus makes it difficult to do a complete rotator cuff evaluation but it does appear as though she has a rather significant near partial thickness tear of the supraspinatus at the footprint. Infraspinatus was difficult to evaluate.      Assessment & Plan:  Persistent right shoulder pain with ultrasound evidence of probable high-grade partial versus full-thickness rotator cuff tear of the supraspinatus  Patient will start the topical nitroglycerin protocol. Since she has plateaued in physical therapy and has a good  understanding of her home exercises I will have her discontinue formal physical therapy for now. Patient will avoid those activities that she knows will cause pain and she will follow-up with me in 4 weeks for reevaluation and repeat ultrasound. If symptoms persist or worsen then I may need to consider further imaging in the form of an MRI. She may continue with her qhs Aleve as needed. Patient will call with questions or concerns prior to her follow-up visit.

## 2015-11-13 ENCOUNTER — Encounter: Payer: Commercial Managed Care - HMO | Admitting: Physical Therapy

## 2015-11-17 ENCOUNTER — Encounter: Payer: Commercial Managed Care - HMO | Admitting: Physical Therapy

## 2015-11-19 ENCOUNTER — Encounter: Payer: Commercial Managed Care - HMO | Admitting: Physical Therapy

## 2015-12-10 ENCOUNTER — Ambulatory Visit (INDEPENDENT_AMBULATORY_CARE_PROVIDER_SITE_OTHER): Payer: Commercial Managed Care - HMO | Admitting: Sports Medicine

## 2015-12-10 ENCOUNTER — Encounter: Payer: Self-pay | Admitting: Sports Medicine

## 2015-12-10 VITALS — BP 127/76 | Ht 69.0 in | Wt 315.0 lb

## 2015-12-10 DIAGNOSIS — S46001D Unspecified injury of muscle(s) and tendon(s) of the rotator cuff of right shoulder, subsequent encounter: Secondary | ICD-10-CM

## 2015-12-10 NOTE — Progress Notes (Signed)
   Subjective:    Patient ID: Sheila Hartman, female    DOB: 1951-05-17, 65 y.o.   MRN: [id number]  HPI   Patient comes in today for follow-up on a right shoulder rotator cuff tear. She feels like her pain may have improved slightly. She is using her nitroglycerin patches but is wearing it in the wrong spot. She has noticed weakness in this arm. She does not want surgery.    Review of Systems     Objective:   Physical Exam  Well-developed, well-nourished. No acute distress  Right shoulder: Full range of motion. 3-4/5 with resisted supraspinatus. Neurovascularly intact distally.      Assessment & Plan:   Right shoulder pain secondary to rotator cuff tear  Patient is currently wearing her nitroglycerin patch halfway down her humerus. I explained to her that even though this is where she feels her pain, the tear is actually much more proximal in the shoulder. I have shown her where I want her to apply the nitroglycerin patch. She will continue with exercises that do not cause pain and she will return to the office in 4-6 weeks for reevaluation. I had planned on repeating her ultrasound today but given her minimal improvement in symptoms I do not think that is necessary. If her symptoms persist or worsen and she would like to discuss merits of arthroscopy then I would be happy to arrange for consultation with Dr. Mardelle Matte but as of right now the patient does not want any sort of surgery.

## 2016-01-08 ENCOUNTER — Ambulatory Visit (INDEPENDENT_AMBULATORY_CARE_PROVIDER_SITE_OTHER): Payer: Commercial Managed Care - HMO | Admitting: Family Medicine

## 2016-01-08 ENCOUNTER — Encounter: Payer: Self-pay | Admitting: Family Medicine

## 2016-01-08 VITALS — BP 133/78 | HR 82 | Temp 98.2°F | Ht 69.0 in | Wt 299.0 lb

## 2016-01-08 DIAGNOSIS — M79631 Pain in right forearm: Secondary | ICD-10-CM | POA: Diagnosis not present

## 2016-01-08 NOTE — Patient Instructions (Signed)
Your right forearm pain sound muscular in nature. You did not have any concerning symptoms or signs of nerve damage. Continue to take aleve 1-2 times a week as needed.  - Keep a record of pain for the next few weeks and  discussed this with Dr. Micheline Chapman when you see him next  Where the pain is  What you were doing when the pain started  Any numbness, weakness, burning / pins and needle sensation   Follow-up with Dr. Gerarda Fraction in 4-6 weeks for high blood pressure

## 2016-01-08 NOTE — Progress Notes (Signed)
   Subjective:    Patient ID: Sheila Hartman, female    DOB: [birth year], 65 y.o.   MRN: [id number]  Seen for Same day visit for   CC: Right forearm pain - Right-hand dominant  She reports intermittent right forearm pain for the past several months. Pain is sharp, located in both lateral and medial forearm and radiates to the wrist. Pain occurs 1-2 times a week and last for minutes before resolving without intervention.  She did not recall any particular activities that cause the pain. She takes Aleve 1-2 times a week as needed for forearm pain as well as additional right shoulder or knee pain.  She denies any right extremity numbness, burning or pins and needles sensation.  Denies any remote or recent trauma to right extremity or neck. She is currently being treated at sports medicine for right rotator cuff tear, and reports this is improving. She reports some right hand weakness associated with pain, but strength returns once pain resolves. Denies fevers, chills, redness, swelling. She has been working with physical therapist doing upper extremity strengthening; however, reports pain started several months prior to beginning exercises.   Smoking history noted Review of Systems   See HPI for ROS. Objective:  BP 133/78 mmHg  Pulse 82  Temp(Src) 98.2 F (36.8 C) (Oral)  Ht 5\' 9"  (1.753 m)  Wt 299 lb (135.626 kg)  BMI 44.13 kg/m2  General: NAD Right Forearm Inspection: No erythema or swelling Palpation: No tenderness along the extensor or flexor compartments or medial lateral epicondyle.  ROM: Full flexion, extension, wrist pronation & supination  No pain with resisted wrist flexion, extension, pronation or supination Negative Tinel's at cubital tunnel and carpal tunnel Grip strength 5/5 No atrophy of thenar or hyperthenar eminence noted    Assessment & Plan:   Right forearm pain Intermittent right forearm pain in both extensor and flexor comparments of unknown etiology. No  concerning symptoms or signs of neurovascular compromise.  Sounds muscular in nature, however cannot ascertain a particular activity that exacerbates symptoms.  Discussed option of additional evaluation with x-rays or ultrasound and she chooses to defer imaging - As her to keep a journal of when the pain occurs as well as additional symptoms of numbness, weakness and exacerbating activities - She has appointment to follow with Dr. Micheline Chapman for right shoulder pain in a few weeks and will discuss her forearm pain with him

## 2016-01-08 NOTE — Assessment & Plan Note (Signed)
Intermittent right forearm pain in both extensor and flexor comparments of unknown etiology. No concerning symptoms or signs of neurovascular compromise.  Sounds muscular in nature, however cannot ascertain a particular activity that exacerbates symptoms.  Discussed option of additional evaluation with x-rays or ultrasound and Sheila Hartman chooses to defer imaging - As her to keep a journal of when the pain occurs as well as additional symptoms of numbness, weakness and exacerbating activities - Sheila Hartman has appointment to follow with Dr. Micheline Chapman for right shoulder pain in a few weeks and will discuss her forearm pain with him

## 2016-01-20 ENCOUNTER — Other Ambulatory Visit: Payer: Self-pay

## 2016-01-20 DIAGNOSIS — Z1231 Encounter for screening mammogram for malignant neoplasm of breast: Secondary | ICD-10-CM

## 2016-01-21 ENCOUNTER — Ambulatory Visit
Admission: RE | Admit: 2016-01-21 | Discharge: 2016-01-21 | Disposition: A | Discharge status: Home / Self Care | Payer: Commercial Managed Care - HMO | Source: Ambulatory Visit | Attending: Sports Medicine | Admitting: Sports Medicine

## 2016-01-21 ENCOUNTER — Ambulatory Visit (INDEPENDENT_AMBULATORY_CARE_PROVIDER_SITE_OTHER): Payer: Commercial Managed Care - HMO | Admitting: Sports Medicine

## 2016-01-21 ENCOUNTER — Encounter: Payer: Self-pay | Admitting: Sports Medicine

## 2016-01-21 VITALS — BP 123/67 | HR 89 | Ht 69.0 in | Wt 300.0 lb

## 2016-01-21 DIAGNOSIS — M79601 Pain in right arm: Secondary | ICD-10-CM | POA: Diagnosis not present

## 2016-01-21 DIAGNOSIS — M5030 Other cervical disc degeneration, unspecified cervical region: Secondary | ICD-10-CM

## 2016-01-21 NOTE — Progress Notes (Signed)
   Subjective:    Patient ID: Sheila Hartman, female    DOB: 08-21-1951, 65 y.o.   MRN: [id number]  HPI   Patient comes in today for follow-up on a right shoulder rotator cuff tear. Her pain is tolerable. She still has good range of motion. She is using her nitroglycerin patches intermittently and states that she does think that they are helping. Today she has a new complaint. For the past several weeks she has been noticing an achy type of discomfort that is in the forearm and at times it will radiate into her hand. No neck pain. No numbness or tingling. Symptoms are intermittent and not related to any specific activity. She has not noticed any weakness. She does have a history of a cervical disc herniation many years ago. No prior neck surgeries.    Review of Systems    as above Objective:   Physical Exam  Well-developed, well-nourished. No acute distress  Right shoulder: Patient has good range of motion but still has pain with resisted supraspinatus. 4+/5 strength with resisted supraspinatus. Remainder of her rotator cuff strength is 5/5. Neurological exam shows no atrophy of her right upper extremity. No focal strength deficits. Sensation is intact to light touch grossly.  X-rays of her cervical spine including AP and lateral views are obtained. She has multilevel degenerative changes from C4-C7. She has osteophytic changes at each of these levels as well as mild facet hypertrophy. Nothing acute is seen.      Assessment & Plan:   Right shoulder pain secondary to partial thickness rotator cuff tear Right arm pain likely secondary to cervical radiculopathy from cervical degenerative disc disease  I discussed with the patient further workup and treatment for both of these problems. Currently her symptoms are tolerable. If her neck pain persists or worsens, we did discuss trying physical therapy before further diagnostic imaging. If her right shoulder pain persists or worsens, then  we may need to consider further diagnostic imaging here as well. Patient will follow-up with me as needed.

## 2016-01-22 ENCOUNTER — Other Ambulatory Visit: Payer: Self-pay

## 2016-01-22 ENCOUNTER — Ambulatory Visit
Admission: RE | Admit: 2016-01-22 | Discharge: 2016-01-22 | Disposition: A | Discharge status: Home / Self Care | Payer: Commercial Managed Care - HMO | Source: Ambulatory Visit

## 2016-01-22 DIAGNOSIS — Z1231 Encounter for screening mammogram for malignant neoplasm of breast: Secondary | ICD-10-CM

## 2016-05-11 ENCOUNTER — Ambulatory Visit (INDEPENDENT_AMBULATORY_CARE_PROVIDER_SITE_OTHER): Payer: Commercial Managed Care - HMO | Admitting: *Deleted

## 2016-05-11 ENCOUNTER — Encounter: Payer: Self-pay | Admitting: *Deleted

## 2016-05-11 ENCOUNTER — Ambulatory Visit: Payer: Commercial Managed Care - HMO | Admitting: Obstetrics and Gynecology

## 2016-05-11 VITALS — BP 138/68 | HR 102 | Temp 98.0°F | Ht 69.0 in | Wt 306.6 lb

## 2016-05-11 DIAGNOSIS — Z0000 Encounter for general adult medical examination without abnormal findings: Secondary | ICD-10-CM | POA: Diagnosis not present
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
## 2016-05-11 NOTE — Patient Instructions (Signed)
 Fall Prevention in the Home  Falls can cause injuries. They can happen to people of all ages. There are many things you can do to make your home safe and to help prevent falls.  WHAT CAN I DO ON THE OUTSIDE OF MY HOME?  Regularly fix the edges of walkways and driveways and fix any cracks.  Remove anything that might make you trip as you walk through a door, such as a raised step or threshold.  Trim any bushes or trees on the path to your home.  Use bright outdoor lighting.  Clear any walking paths of anything that might make someone trip, such as rocks or tools.  Regularly check to see if handrails are loose or broken. Make sure that both sides of any steps have handrails.  Any raised decks and porches should have guardrails on the edges.  Have any leaves, snow, or ice cleared regularly.  Use sand or salt on walking paths during winter.  Clean up any spills in your garage right away. This includes oil or grease spills. WHAT CAN I DO IN THE BATHROOM?   Use night lights.  Install grab bars by the toilet and in the tub and shower. Do not use towel bars as grab bars.  Use non-skid mats or decals in the tub or shower.  If you need to sit down in the shower, use a plastic, non-slip stool.  Keep the floor dry. Clean up any water that spills on the floor as soon as it happens.  Remove soap buildup in the tub or shower regularly.  Attach bath mats securely with double-sided non-slip rug tape.  Do not have throw rugs and other things on the floor that can make you trip. WHAT CAN I DO IN THE BEDROOM?  Use night lights.  Make sure that you have a light by your bed that is easy to reach.  Do not use any sheets or blankets that are too big for your bed. They should not hang down onto the floor.  Have a firm chair that has side arms. You can use this for support while you get dressed.  Do not have throw rugs and other things on the floor that can make you trip. WHAT CAN I DO  IN THE KITCHEN?  Clean up any spills right away.  Avoid walking on wet floors.  Keep items that you use a lot in easy-to-reach places.  If you need to reach something above you, use a strong step stool that has a grab bar.  Keep electrical cords out of the way.  Do not use floor polish or wax that makes floors slippery. If you must use wax, use non-skid floor wax.  Do not have throw rugs and other things on the floor that can make you trip. WHAT CAN I DO WITH MY STAIRS?  Do not leave any items on the stairs.  Make sure that there are handrails on both sides of the stairs and use them. Fix handrails that are broken or loose. Make sure that handrails are as long as the stairways.  Check any carpeting to make sure that it is firmly attached to the stairs. Fix any carpet that is loose or worn.  Avoid having throw rugs at the top or bottom of the stairs. If you do have throw rugs, attach them to the floor with carpet tape.  Make sure that you have a light switch at the top of the stairs and the bottom of the   stairs. If you do not have them, ask someone to add them for you. WHAT ELSE CAN I DO TO HELP PREVENT FALLS?  Wear shoes that:  Do not have high heels.  Have rubber bottoms.  Are comfortable and fit you well.  Are closed at the toe. Do not wear sandals.  If you use a stepladder:  Make sure that it is fully opened. Do not climb a closed stepladder.  Make sure that both sides of the stepladder are locked into place.  Ask someone to hold it for you, if possible.  Clearly mark and make sure that you can see:  Any grab bars or handrails.  First and last steps.  Where the edge of each step is.  Use tools that help you move around (mobility aids) if they are needed. These include:  Canes.  Walkers.  Scooters.  Crutches.  Turn on the lights when you go into a dark area. Replace any light bulbs as soon as they burn out.  Set up your furniture so you have a clear  path. Avoid moving your furniture around.  If any of your floors are uneven, fix them.  If there are any pets around you, be aware of where they are.  Review your medicines with your doctor. Some medicines can make you feel dizzy. This can increase your chance of falling. Ask your doctor what other things that you can do to help prevent falls.   This information is not intended to replace advice given to you by your health care provider. Make sure you discuss any questions you have with your health care provider.   Document Released: 07/03/2009 Document Revised: 01/21/2015 Document Reviewed: 10/11/2014 Elsevier Interactive Patient Education 2016 [employer]  Health Maintenance, Female Adopting a healthy lifestyle and getting preventive care can go a long way to promote health and wellness. Talk with your health care provider about what schedule of regular examinations is right for you. This is a good chance for you to check in with your provider about disease prevention and staying healthy. In between checkups, there are plenty of things you can do on your own. Experts have done a lot of research about which lifestyle changes and preventive measures are most likely to keep you healthy. Ask your health care provider for more information. WEIGHT AND DIET  Eat a healthy diet  Be sure to include plenty of vegetables, fruits, low-fat dairy products, and lean protein.  Do not eat a lot of foods high in solid fats, added sugars, or salt.  Get regular exercise. This is one of the most important things you can do for your health.  Most adults should exercise for at least 150 minutes each week. The exercise should increase your heart rate and make you sweat (moderate-intensity exercise).  Most adults should also do strengthening exercises at least twice a week. This is in addition to the moderate-intensity exercise.  Maintain a healthy weight  Body mass index (BMI) is a measurement that can  be used to identify possible weight problems. It estimates body fat based on height and weight. Your health care provider can help determine your BMI and help you achieve or maintain a healthy weight.  For females 20 years of age and older:   A BMI below 18.5 is considered underweight.  A BMI of 18.5 to 24.9 is normal.  A BMI of 25 to 29.9 is considered overweight.  A BMI of 30 and above is considered obese.  Watch levels   of cholesterol and blood lipids  You should start having your blood tested for lipids and cholesterol at 63 years of age, then have this test every 5 years.  You may need to have your cholesterol levels checked more often if:  Your lipid or cholesterol levels are high.  You are older than 63 years of age.  You are at high risk for heart disease.  CANCER SCREENING   Lung Cancer  Lung cancer screening is recommended for adults 53-62 years old who are at high risk for lung cancer because of a history of smoking.  A yearly low-dose CT scan of the lungs is recommended for people who:  Currently smoke.  Have quit within the past 15 years.  Have at least a 30-pack-year history of smoking. A pack year is smoking an average of one pack of cigarettes a day for 1 year.  Yearly screening should continue until it has been 15 years since you quit.  Yearly screening should stop if you develop a health problem that would prevent you from having lung cancer treatment.  Breast Cancer  Practice breast self-awareness. This means understanding how your breasts normally appear and feel.  It also means doing regular breast self-exams. Let your health care provider know about any changes, no matter how small.  If you are in your 20s or 30s, you should have a clinical breast exam (CBE) by a health care provider every 1-3 years as part of a regular health exam.  If you are 4 or older, have a CBE every year. Also consider having a breast X-ray (mammogram) every year.  If  you have a family history of breast cancer, talk to your health care provider about genetic screening.  If you are at high risk for breast cancer, talk to your health care provider about having an MRI and a mammogram every year.  Breast cancer gene (BRCA) assessment is recommended for women who have family members with BRCA-related cancers. BRCA-related cancers include:  Breast.  Ovarian.  Tubal.  Peritoneal cancers.  Results of the assessment will determine the need for genetic counseling and BRCA1 and BRCA2 testing. Cervical Cancer Your health care provider may recommend that you be screened regularly for cancer of the pelvic organs (ovaries, uterus, and vagina). This screening involves a pelvic examination, including checking for microscopic changes to the surface of your cervix (Pap test). You may be encouraged to have this screening done every 3 years, beginning at age 75.  For women ages 5-65, health care providers may recommend pelvic exams and Pap testing every 3 years, or they may recommend the Pap and pelvic exam, combined with testing for human papilloma virus (HPV), every 5 years. Some types of HPV increase your risk of cervical cancer. Testing for HPV may also be done on women of any age with unclear Pap test results.  Other health care providers may not recommend any screening for nonpregnant women who are considered low risk for pelvic cancer and who do not have symptoms. Ask your health care provider if a screening pelvic exam is right for you.  If you have had past treatment for cervical cancer or a condition that could lead to cancer, you need Pap tests and screening for cancer for at least 20 years after your treatment. If Pap tests have been discontinued, your risk factors (such as having a new sexual partner) need to be reassessed to determine if screening should resume. Some women have medical problems that increase the chance  of getting cervical cancer. In these cases,  your health care provider may recommend more frequent screening and Pap tests. Colorectal Cancer  This type of cancer can be detected and often prevented.  Routine colorectal cancer screening usually begins at 63 years of age and continues through 63 years of age.  Your health care provider may recommend screening at an earlier age if you have risk factors for colon cancer.  Your health care provider may also recommend using home test kits to check for hidden blood in the stool.  A small camera at the end of a tube can be used to examine your colon directly (sigmoidoscopy or colonoscopy). This is done to check for the earliest forms of colorectal cancer.  Routine screening usually begins at age 50.  Direct examination of the colon should be repeated every 5-10 years through 63 years of age. However, you may need to be screened more often if early forms of precancerous polyps or small growths are found. Skin Cancer  Check your skin from head to toe regularly.  Tell your health care provider about any new moles or changes in moles, especially if there is a change in a mole's shape or color.  Also tell your health care provider if you have a mole that is larger than the size of a pencil eraser.  Always use sunscreen. Apply sunscreen liberally and repeatedly throughout the day.  Protect yourself by wearing long sleeves, pants, a wide-brimmed hat, and sunglasses whenever you are outside. HEART DISEASE, DIABETES, AND HIGH BLOOD PRESSURE   High blood pressure causes heart disease and increases the risk of stroke. High blood pressure is more likely to develop in:  People who have blood pressure in the high end of the normal range (130-139/85-89 mm Hg).  People who are overweight or obese.  People who are African American.  If you are 18-39 years of age, have your blood pressure checked every 3-5 years. If you are 40 years of age or older, have your blood pressure checked every year. You  should have your blood pressure measured twice--once when you are at a hospital or clinic, and once when you are not at a hospital or clinic. Record the average of the two measurements. To check your blood pressure when you are not at a hospital or clinic, you can use:  An automated blood pressure machine at a pharmacy.  A home blood pressure monitor.  If you are between 55 years and 79 years old, ask your health care provider if you should take aspirin to prevent strokes.  Have regular diabetes screenings. This involves taking a blood sample to check your fasting blood sugar level.  If you are at a normal weight and have a low risk for diabetes, have this test once every three years after 63 years of age.  If you are overweight and have a high risk for diabetes, consider being tested at a younger age or more often. PREVENTING INFECTION  Hepatitis B  If you have a higher risk for hepatitis B, you should be screened for this virus. You are considered at high risk for hepatitis B if:  You were born in a country where hepatitis B is common. Ask your health care provider which countries are considered high risk.  Your parents were born in a high-risk country, and you have not been immunized against hepatitis B (hepatitis B vaccine).  You have HIV or AIDS.  You use needles to inject street drugs.  You   live with someone who has hepatitis B.  You have had sex with someone who has hepatitis B.  You get hemodialysis treatment.  You take certain medicines for conditions, including cancer, organ transplantation, and autoimmune conditions. Hepatitis C  Blood testing is recommended for:  Everyone born from 1945 through 1965.  Anyone with known risk factors for hepatitis C. Sexually transmitted infections (STIs)  You should be screened for sexually transmitted infections (STIs) including gonorrhea and chlamydia if:  You are sexually active and are younger than 63 years of age.  You  are older than 63 years of age and your health care provider tells you that you are at risk for this type of infection.  Your sexual activity has changed since you were last screened and you are at an increased risk for chlamydia or gonorrhea. Ask your health care provider if you are at risk.  If you do not have HIV, but are at risk, it may be recommended that you take a prescription medicine daily to prevent HIV infection. This is called pre-exposure prophylaxis (PrEP). You are considered at risk if:  You are sexually active and do not regularly use condoms or know the HIV status of your partner(s).  You take drugs by injection.  You are sexually active with a partner who has HIV. Talk with your health care provider about whether you are at high risk of being infected with HIV. If you choose to begin PrEP, you should first be tested for HIV. You should then be tested every 3 months for as long as you are taking PrEP.  PREGNANCY   If you are premenopausal and you may become pregnant, ask your health care provider about preconception counseling.  If you may become pregnant, take 400 to 800 micrograms (mcg) of folic acid every day.  If you want to prevent pregnancy, talk to your health care provider about birth control (contraception). OSTEOPOROSIS AND MENOPAUSE   Osteoporosis is a disease in which the bones lose minerals and strength with aging. This can result in serious bone fractures. Your risk for osteoporosis can be identified using a bone density scan.  If you are 65 years of age or older, or if you are at risk for osteoporosis and fractures, ask your health care provider if you should be screened.  Ask your health care provider whether you should take a calcium or vitamin D supplement to lower your risk for osteoporosis.  Menopause may have certain physical symptoms and risks.  Hormone replacement therapy may reduce some of these symptoms and risks. Talk to your health care  provider about whether hormone replacement therapy is right for you.  HOME CARE INSTRUCTIONS   Schedule regular health, dental, and eye exams.  Stay current with your immunizations.   Do not use any tobacco products including cigarettes, chewing tobacco, or electronic cigarettes.  If you are pregnant, do not drink alcohol.  If you are breastfeeding, limit how much and how often you drink alcohol.  Limit alcohol intake to no more than 1 drink per day for nonpregnant women. One drink equals 12 ounces of beer, 5 ounces of wine, or 1 ounces of hard liquor.  Do not use street drugs.  Do not share needles.  Ask your health care provider for help if you need support or information about quitting drugs.  Tell your health care provider if you often feel depressed.  Tell your health care provider if you have ever been abused or do not feel safe   at home.   This information is not intended to replace advice given to you by your health care provider. Make sure you discuss any questions you have with your health care provider.   Document Released: 03/22/2011 Document Revised: 09/27/2014 Document Reviewed: 08/08/2013 Elsevier Interactive Patient Education 2016 Elsevier Inc.  

## 2016-05-11 NOTE — Progress Notes (Signed)
Addendum: I have reviewed this visit and discussed with Howell Rucks, RN, BSN, and agree with her documentation.    Luiz Blare, DO 05/11/2016  PGY-3, [facility]

## 2016-05-25 ENCOUNTER — Encounter: Payer: Self-pay | Admitting: Obstetrics and Gynecology

## 2016-05-25 ENCOUNTER — Ambulatory Visit (INDEPENDENT_AMBULATORY_CARE_PROVIDER_SITE_OTHER): Payer: Commercial Managed Care - HMO | Admitting: Obstetrics and Gynecology

## 2016-05-25 VITALS — BP 144/83 | HR 90 | Temp 97.9°F | Wt 308.0 lb

## 2016-05-25 DIAGNOSIS — H3521 Other non-diabetic proliferative retinopathy, right eye: Secondary | ICD-10-CM

## 2016-05-25 DIAGNOSIS — Z23 Encounter for immunization: Secondary | ICD-10-CM

## 2016-05-25 DIAGNOSIS — M170 Bilateral primary osteoarthritis of knee: Secondary | ICD-10-CM

## 2016-05-25 DIAGNOSIS — M79631 Pain in right forearm: Secondary | ICD-10-CM

## 2016-05-25 DIAGNOSIS — Z0000 Encounter for general adult medical examination without abnormal findings: Secondary | ICD-10-CM

## 2016-05-25 NOTE — Progress Notes (Signed)
     Subjective: Chief Complaint  Patient presents with  . Follow-up     HPI: Sheila Hartman is a 65 y.o. presenting to clinic today for follow-up. Patient states she just wanted to have a checkup. She states that she continues to have right arm pain. Right arm pain was diagnosed as having rotator cuff tear. She does not believe this is from her shoulder. States that the arm pain is stabbing and constant. Worse with movement. She is right-hand dominant. Also continues to have left knee pain. Has known OA of the knee and receives injections and treatment from sports medicine.  Health Maintenance: Due for flu vaccine and shingles    ROS noted in HPI.  Past Medical, Surgical, Social, and Family History Reviewed & Updated per EMR. Smoking status - Former smoker   Objective: BP (!) 144/83   Pulse 90   Temp 97.9 F (36.6 C) (Oral)   Wt (!) 308 lb (139.7 kg)   SpO2 98%   BMI 45.48 kg/m  Vitals and nursing notes reviewed  Physical Exam  Constitutional: She is oriented to person, place, and time and well-developed, well-nourished, and in no distress.  Eyes: Pupils are equal, round, and reactive to light. Right eye exhibits nystagmus. Left eye exhibits nystagmus.  Neck: Normal range of motion and full passive range of motion without pain. Neck supple.  Spurling's negative  Cardiovascular: Normal rate and regular rhythm.   Pulmonary/Chest: Effort normal and breath sounds normal.  Neurological: She is alert and oriented to person, place, and time. She has normal motor skills, normal sensation, normal strength and intact cranial nerves.     Assessment/Plan: Please see problem based Assessment and Plan   Orders Placed This Encounter  Procedures  . Flu Vaccine QUAD 36+ mos IM     Luiz Blare, DO 05/25/2016, 3:40 PM PGY-3, [place]

## 2016-05-25 NOTE — Patient Instructions (Signed)
I am pleased to hear that things are going well for you.  Here are some of the things we discussed today: -Continue conservative therapies for arm and knee -Call for medication refills if needed -Flu shot given today -Shingles vaccine sent to pharmacy  Please schedule a follow-up appointment as needed   Thanks for allowing me to be a part of your care! Dr. Gerarda Fraction

## 2016-05-27 DIAGNOSIS — H3520 Other non-diabetic proliferative retinopathy, unspecified eye: Secondary | ICD-10-CM | POA: Insufficient documentation

## 2016-05-27 NOTE — Assessment & Plan Note (Signed)
Follows with sports medicine. Has been diagnosed with partial rotator cuff tear and right shoulder. Continues to endorse forearm pain that she believes is related to her shoulder. Cervical exam the neuro exam unremarkable on exam today. May be radiation from shoulder. We'll continue to monitor. Patient followed with sports medicine as directed. Continue when necessary OTC pain meds.

## 2016-05-27 NOTE — Assessment & Plan Note (Signed)
Received flu vaccine today

## 2016-05-27 NOTE — Assessment & Plan Note (Signed)
Chronic. Left knee worse than right. Continues to have intermittent pain with knees. Follow to sports medicine for knee injections and treatment. Patient is able to continue to be active and mobile in her lifestyle. Only uses Aleve or Motrin for pain relief. Continue therapies.

## 2016-05-27 NOTE — Assessment & Plan Note (Signed)
A: Chronic and stable. States that doctor has said there is no further treatment available. Has associated nystagmus.  P: Continue following up with ophthalmologist.

## 2016-07-16 ENCOUNTER — Other Ambulatory Visit: Payer: Self-pay | Admitting: Obstetrics and Gynecology

## 2016-07-26 ENCOUNTER — Ambulatory Visit (INDEPENDENT_AMBULATORY_CARE_PROVIDER_SITE_OTHER): Payer: Commercial Managed Care - HMO | Admitting: Obstetrics and Gynecology

## 2016-07-26 ENCOUNTER — Encounter: Payer: Self-pay | Admitting: Obstetrics and Gynecology

## 2016-07-26 VITALS — BP 137/89 | HR 93 | Temp 98.2°F | Wt 309.0 lb

## 2016-07-26 DIAGNOSIS — J069 Acute upper respiratory infection, unspecified: Secondary | ICD-10-CM | POA: Diagnosis not present

## 2016-07-26 DIAGNOSIS — B9789 Other viral agents as the cause of diseases classified elsewhere: Secondary | ICD-10-CM | POA: Diagnosis not present

## 2016-07-26 MED ORDER — BENZONATATE 200 MG PO CAPS: 200.0000 mg | ORAL_CAPSULE | Freq: Two times a day (BID) | ORAL | 0 refills | Status: DC | PRN

## 2016-07-26 MED ORDER — GUAIFENESIN-DM 100-10 MG/5ML PO SYRP: 5.0000 mL | ORAL_SOLUTION | ORAL | 0 refills | Status: DC | PRN

## 2016-07-26 NOTE — Patient Instructions (Signed)
Medications sent to pharmacy for cough Viral infection Appears to be improving; cough could last a couple weeks after virus   Follow-up if no improvement after 2 weeks

## 2016-07-26 NOTE — Progress Notes (Signed)
   Subjective:   Patient ID: Sheila Hartman, female    DOB: 1950-11-10, 65 y.o.   MRN: [id number]  Patient presents for Same Day Appointment  Chief Complaint  Patient presents with  . Cough    HPI: # UPPER RESPIRATORY INFECTION Started about a week ago Associated sore throat, congestions, sneezing, rhinorrhea, and cough Overall symptoms improving but cough is persisting Cough is productive of clear sputum  Hurts to cough and is losing sleep due to coughing  Has tried: coricidin  Sick contacts with similiar symptoms: yes  Symptoms: Fever: no   Headache/face pain: no  Sneezing: yes  Itchy scratchy throat: yes  Headache: no  Muscle aches: no  Dyspnea: no  Rash: no  Chest pain: no  Review of Systems   See HPI for ROS.   Smoking status - Former smoker  Past medical history, surgical, family, and social history reviewed and updated in the EMR as appropriate.  Objective:  BP 137/89   Pulse 93   Temp 98.2 F (36.8 C) (Oral)   Wt (!) 309 lb (140.2 kg)   SpO2 96%   BMI 45.63 kg/m  Vitals and nursing note reviewed  Physical Exam  Constitutional: She is well-developed, well-nourished, and in no distress.  HENT:  Mouth/Throat: Oropharynx is clear and moist.  Eyes: Conjunctivae and EOM are normal.  Neck: Normal range of motion. Neck supple.  Cardiovascular: Normal rate, regular rhythm and normal heart sounds.   Pulmonary/Chest: Effort normal and breath sounds normal. She has no wheezes. She has no rales.    Assessment & Plan:  1. Viral upper respiratory tract infection Well-appearing and afebrile. URI has improved but now with persistent cough. Rx for Tessalon and cough syrup to use prn for cough. Patient educated on post-viral cough that could persist weeks after URI. Return precautions given. Follow-up prn if not improving.    Luiz Blare, DO 07/26/2016, 3:44 PM PGY-3, [place]

## 2016-10-12 ENCOUNTER — Ambulatory Visit (INDEPENDENT_AMBULATORY_CARE_PROVIDER_SITE_OTHER): Payer: Medicare HMO | Admitting: Obstetrics and Gynecology

## 2016-10-12 ENCOUNTER — Encounter: Payer: Self-pay | Admitting: Obstetrics and Gynecology

## 2016-10-12 VITALS — BP 126/60 | HR 98 | Temp 98.0°F | Ht 69.0 in | Wt 303.8 lb

## 2016-10-12 DIAGNOSIS — R05 Cough: Secondary | ICD-10-CM

## 2016-10-12 DIAGNOSIS — R059 Cough, unspecified: Secondary | ICD-10-CM

## 2016-10-12 MED ORDER — MELOXICAM 15 MG PO TABS: ORAL_TABLET | ORAL | 6 refills | Status: DC

## 2016-10-12 NOTE — Patient Instructions (Signed)
May have had the flu but you are doing better now Continue the medications you have been taking as needed for symptoms  Follow-up as needed

## 2016-10-12 NOTE — Progress Notes (Signed)
   Subjective:   Patient ID: SHAUNA CASHION, female    DOB: 08/25/1951, 66 y.o.   MRN: [id number]  Patient presents for Same Day Appointment  Chief Complaint  Patient presents with  . Cough    HPI: # Cough Started having URI symptoms about a week ago. Symptoms were really bad for about two days. Subjectively ran a fever for one day. Had some muscle aches and had to lay in bed all day. Has been taking Tylenol and Coricidin for symptoms. URI symptoms improved but cough has persisted. Cough is productive of mucous. Overall feeling well just wants cough to go away.   PMH not significant for any lung disease.   Review of Systems   See HPI for ROS.   History  Smoking Status  . Former Smoker  . Packs/day: 10.00  . Years: 5.00  . Types: Cigarettes  . Quit date: 05/11/1996  Smokeless Tobacco  . Never Used    Past medical history, surgical, family, and social history reviewed and updated in the EMR as appropriate.  Objective:  BP 126/60   Pulse 98   Temp 98 F (36.7 C) (Oral)   Ht 5\' 9"  (1.753 m)   Wt (!) 303 lb 12.8 oz (137.8 kg)   SpO2 92%   BMI 44.86 kg/m  Vitals and nursing note reviewed  Physical Exam  Constitutional: She is well-developed, well-nourished, and in no distress.  HENT:  Mouth/Throat: Oropharynx is clear and moist.  Eyes: Conjunctivae and EOM are normal.  Neck: Normal range of motion. Neck supple.  Lipoma on side of neck  Cardiovascular: Normal rate and regular rhythm.   Pulmonary/Chest: Effort normal and breath sounds normal.  Lymphadenopathy:    She has no cervical adenopathy.    Assessment & Plan:  1. Cough Persistent cough s/p viral URI versus flu. However patient now 1 week after illness. Vitals are stable and patient well appearing. Feeling much improved. Cough most liekly just a postviral cough. Conservative management. Return precautions discussed.  PATIENT EDUCATION PROVIDED: See AVS   Luiz Blare, DO 10/12/2016, 1:37 PM PGY-3, [facility]

## 2016-10-12 NOTE — Progress Notes (Deleted)
Ran a fever subjectively and layed down Taking coresdien and tylenol Had fever for one day Was down for two days Symptoms started on Tuesday with sore throat, Thursday symptoms started improving Arms still aching, cough, runny nose Back hurts moving slow yellowish

## 2016-11-02 DIAGNOSIS — H04213 Epiphora due to excess lacrimation, bilateral lacrimal glands: Secondary | ICD-10-CM | POA: Diagnosis not present

## 2016-11-02 DIAGNOSIS — H33051 Total retinal detachment, right eye: Secondary | ICD-10-CM | POA: Diagnosis not present

## 2016-11-02 DIAGNOSIS — H21541 Posterior synechiae (iris), right eye: Secondary | ICD-10-CM | POA: Diagnosis not present

## 2016-11-02 DIAGNOSIS — H26491 Other secondary cataract, right eye: Secondary | ICD-10-CM | POA: Diagnosis not present

## 2016-11-02 DIAGNOSIS — H35412 Lattice degeneration of retina, left eye: Secondary | ICD-10-CM | POA: Diagnosis not present

## 2016-11-02 DIAGNOSIS — H21511 Anterior synechiae (iris), right eye: Secondary | ICD-10-CM | POA: Diagnosis not present

## 2016-11-02 DIAGNOSIS — H2512 Age-related nuclear cataract, left eye: Secondary | ICD-10-CM | POA: Diagnosis not present

## 2016-11-02 DIAGNOSIS — H31092 Other chorioretinal scars, left eye: Secondary | ICD-10-CM | POA: Diagnosis not present

## 2016-11-02 DIAGNOSIS — Z961 Presence of intraocular lens: Secondary | ICD-10-CM | POA: Diagnosis not present

## 2016-11-18 DIAGNOSIS — M25511 Pain in right shoulder: Secondary | ICD-10-CM | POA: Diagnosis not present

## 2016-12-24 ENCOUNTER — Telehealth: Payer: Self-pay | Admitting: Obstetrics and Gynecology

## 2016-12-24 NOTE — Telephone Encounter (Signed)
Clinical info completed on student loan form.  Place form in Dr. Gerarda Fraction' box for completion.  [name], Sheila Hartman

## 2016-12-24 NOTE — Telephone Encounter (Signed)
form dropped off for at front desk for completion.  Verified that patient section of form has been completed.  Last DOS/WCC with PCP was 10/12/16.  Placed form in blue  team folder to be completed by clinical staff.  Sheila Hartman

## 2016-12-27 NOTE — Telephone Encounter (Signed)
Form needed for exception to pay back student loans. Patient states she currently receives disability. Patient is able to function at baseline and at this time do not think she has a qualifying diagnosis to warrant exception based off what the form is duggesting. Discussed with her that if she can provide a letter of her disability I may be able to fill the form out better.

## 2016-12-28 ENCOUNTER — Telehealth: Payer: Self-pay | Admitting: Obstetrics and Gynecology

## 2016-12-28 ENCOUNTER — Other Ambulatory Visit: Payer: Self-pay | Admitting: Obstetrics and Gynecology

## 2016-12-28 MED ORDER — AMLODIPINE BESY-BENAZEPRIL HCL 5-20 MG PO CAPS: 1.0000 | ORAL_CAPSULE | Freq: Every day | ORAL | 1 refills | Status: DC

## 2016-12-28 NOTE — Telephone Encounter (Signed)
[employer] Benefit information form dropped off for at front desk for completion.  Verified that patient section of form has been completed.  Last DOS/WCC with PCP was 10/12/16.  Placed form in team folder to be completed by clinical staff.  Crista Luria

## 2016-12-28 NOTE — Telephone Encounter (Signed)
Letter placed in provider's box with proof of what kind of disability patient has and how much.  This was asked for on form originally given to provider. Jazmin Hartsell,CMA

## 2016-12-28 NOTE — Telephone Encounter (Signed)
Needs to refill Amlodapine (?) and Benazopril , sent to [facility] on [address].  Thank you

## 2017-01-04 DIAGNOSIS — Z0000 Encounter for general adult medical examination without abnormal findings: Secondary | ICD-10-CM | POA: Diagnosis not present

## 2017-01-04 DIAGNOSIS — E669 Obesity, unspecified: Secondary | ICD-10-CM | POA: Diagnosis not present

## 2017-01-04 DIAGNOSIS — H33029 Retinal detachment with multiple breaks, unspecified eye: Secondary | ICD-10-CM | POA: Diagnosis not present

## 2017-01-04 DIAGNOSIS — Z79899 Other long term (current) drug therapy: Secondary | ICD-10-CM | POA: Diagnosis not present

## 2017-01-04 DIAGNOSIS — I10 Essential (primary) hypertension: Secondary | ICD-10-CM | POA: Diagnosis not present

## 2017-01-04 NOTE — Telephone Encounter (Signed)
Received documentation that patient does receive disability money. However the form does not indicate for what or whom evaluated her for this. At this time I am unable to fill out her form. I do not think she meets qualification to have student loans removed due to disability that henders her from doing substantial gainful activity. I did however write a letter for patient to send in explaining her situation. Please inform patient that her forms and letter are ready to be picked up. Thanks!

## 2017-01-05 NOTE — Telephone Encounter (Signed)
Contacted pt and informed her of letter and forms ready for pick up at [facility]. Pt was appreciative and voiced understanding.

## 2017-01-11 ENCOUNTER — Other Ambulatory Visit: Payer: Self-pay | Admitting: Obstetrics and Gynecology

## 2017-01-11 DIAGNOSIS — Z1231 Encounter for screening mammogram for malignant neoplasm of breast: Secondary | ICD-10-CM

## 2017-02-03 ENCOUNTER — Ambulatory Visit: Payer: Medicare HMO

## 2017-02-16 ENCOUNTER — Ambulatory Visit
Admission: RE | Admit: 2017-02-16 | Discharge: 2017-02-16 | Disposition: A | Discharge status: Home / Self Care | Payer: Medicare HMO | Source: Ambulatory Visit | Attending: Family Medicine | Admitting: Family Medicine

## 2017-02-16 DIAGNOSIS — Z1231 Encounter for screening mammogram for malignant neoplasm of breast: Secondary | ICD-10-CM

## 2017-04-12 ENCOUNTER — Telehealth: Payer: Self-pay | Admitting: Family Medicine

## 2017-04-12 NOTE — Telephone Encounter (Signed)
Placed in provider's box for review.

## 2017-04-12 NOTE — Telephone Encounter (Signed)
Disability parking placard form dropped off for at front desk for completion.  Verified that patient section of form has been completed.  Last DOS/WCC with PCP was 10/12/16.  Placed form in blue team folder to be completed by clinical staff.  [name]

## 2017-04-20 ENCOUNTER — Encounter: Payer: Self-pay | Admitting: Family Medicine

## 2017-04-20 ENCOUNTER — Ambulatory Visit (INDEPENDENT_AMBULATORY_CARE_PROVIDER_SITE_OTHER): Payer: Medicare HMO | Admitting: Family Medicine

## 2017-04-20 VITALS — BP 148/78 | HR 103 | Temp 99.3°F | Ht 69.0 in | Wt 275.8 lb

## 2017-04-20 DIAGNOSIS — Z0000 Encounter for general adult medical examination without abnormal findings: Secondary | ICD-10-CM | POA: Diagnosis not present

## 2017-04-20 DIAGNOSIS — I10 Essential (primary) hypertension: Secondary | ICD-10-CM | POA: Diagnosis not present

## 2017-04-20 MED ORDER — TETANUS-DIPHTH-ACELL PERTUSSIS 5-2-15.5 LF-MCG/0.5 IM SUSP: 0.5000 mL | Freq: Once | INTRAMUSCULAR | 0 refills | Status: AC

## 2017-04-20 NOTE — Patient Instructions (Signed)
Exercising to Lose Weight Exercising can help you to lose weight. In order to lose weight through exercise, you need to do vigorous-intensity exercise. You can tell that you are exercising with vigorous intensity if you are breathing very hard and fast and cannot hold a conversation while exercising. Moderate-intensity exercise helps to maintain your current weight. You can tell that you are exercising at a moderate level if you have a higher heart rate and faster breathing, but you are still able to hold a conversation. How often should I exercise? Choose an activity that you enjoy and set realistic goals. Your health care provider can help you to make an activity plan that works for you. Exercise regularly as directed by your health care provider. This may include:  Doing resistance training twice each week, such as: ? Push-ups. ? Sit-ups. ? Lifting weights. ? Using resistance bands.  Doing a given intensity of exercise for a given amount of time. Choose from these options: ? 150 minutes of moderate-intensity exercise every week. ? 75 minutes of vigorous-intensity exercise every week. ? A mix of moderate-intensity and vigorous-intensity exercise every week.  Children, pregnant women, people who are out of shape, people who are overweight, and older adults may need to consult a health care provider for individual recommendations. If you have any sort of medical condition, be sure to consult your health care provider before starting a new exercise program. What are some activities that can help me to lose weight?  Walking at a rate of at least 4.5 miles an hour.  Jogging or running at a rate of 5 miles per hour.  Biking at a rate of at least 10 miles per hour.  Lap swimming.  Roller-skating or in-line skating.  Cross-country skiing.  Vigorous competitive sports, such as football, basketball, and soccer.  Jumping rope.  Aerobic dancing. How can I be more active in my day-to-day  activities?  Use the stairs instead of the elevator.  Take a walk during your lunch break.  If you drive, park your car farther away from work or school.  If you take public transportation, get off one stop early and walk the rest of the way.  Make all of your phone calls while standing up and walking around.  Get up, stretch, and walk around every 30 minutes throughout the day. What guidelines should I follow while exercising?  Do not exercise so much that you hurt yourself, feel dizzy, or get very short of breath.  Consult your health care provider prior to starting a new exercise program.  Wear comfortable clothes and shoes with good support.  Drink plenty of water while you exercise to prevent dehydration or heat stroke. Body water is lost during exercise and must be replaced.  Work out until you breathe faster and your heart beats faster. This information is not intended to replace advice given to you by your health care provider. Make sure you discuss any questions you have with your health care provider. Document Released: 10/09/2010 Document Revised: 02/12/2016 Document Reviewed: 02/07/2014 Elsevier Interactive Patient Education  2018 Elsevier Inc.  

## 2017-04-20 NOTE — Assessment & Plan Note (Signed)
  Not at goal today of <130/80. Patient compliant with medications and would prefer to work on losing weight instead of changing dose at this time  -encouraged weight loss, limiting salt in diet -continue current dose of Lotrel, consider increasing norvasc component to 10 mg at future visit if still elevated

## 2017-04-20 NOTE — Progress Notes (Signed)
    Subjective:    Patient ID: Sheila Hartman, female    DOB: 1951-08-24, 66 y.o.   MRN: [id number]   CC: here for physical exam  Overall doing well. Wanted to meet new PCP. She has arthritis in several joints that causes pain but manages with NSAIDs.   HTN On combined CCB and ACEi Denies missing any doses Denies CP, SOB, headaches, vision changes  Trying to lose weight, is down 25 pounds from January Walks on treadmill for exercise, at 30 minutes 3x times a week.  Smoking status reviewed- non-smoker  Review of Systems- see HPI   Objective:  BP (!) 148/78   Pulse (!) 103   Temp 99.3 F (37.4 C) (Oral)   Ht 5\' 9"  (1.753 m)   Wt 275 lb 12.8 oz (125.1 kg)   SpO2 98%   BMI 40.73 kg/m  Vitals and nursing note reviewed  General: well nourished, in no acute distress Cardiac: RRR, clear S1 and S2, no murmurs, rubs, or gallops Respiratory: clear to auscultation bilaterally, no increased work of breathing Extremities: no edema or cyanosis Skin: warm and dry, no rashes noted Neuro: alert and oriented, no focal deficits. Strength 5/5 in upper and lower extremities bilaterally, sensation in tact throughout  Assessment & Plan:    Healthcare maintenance  Due for Tdap, flu vaccine  -rx given for patient to Tdap at her pharmacy -instructed her to make nurse visit in sept/oct for flu shot  HTN (hypertension)  Not at goal today of <130/80. Patient compliant with medications and would prefer to work on losing weight instead of changing dose at this time  -encouraged weight loss, limiting salt in diet -continue current dose of Lotrel, consider increasing norvasc component to 10 mg at future visit if still elevated    Return in about 6 months (around 10/21/2017), or as needed.   Lucila Maine, DO Family Medicine Resident PGY-2

## 2017-04-20 NOTE — Assessment & Plan Note (Addendum)
  Due for Tdap, flu vaccine  -rx given for patient to Tdap at her pharmacy -instructed her to make nurse visit in sept/oct for flu shot

## 2017-05-02 DIAGNOSIS — H04213 Epiphora due to excess lacrimation, bilateral lacrimal glands: Secondary | ICD-10-CM | POA: Diagnosis not present

## 2017-05-02 DIAGNOSIS — Z961 Presence of intraocular lens: Secondary | ICD-10-CM | POA: Diagnosis not present

## 2017-05-02 DIAGNOSIS — H21541 Posterior synechiae (iris), right eye: Secondary | ICD-10-CM | POA: Diagnosis not present

## 2017-05-02 DIAGNOSIS — H31092 Other chorioretinal scars, left eye: Secondary | ICD-10-CM | POA: Diagnosis not present

## 2017-05-02 DIAGNOSIS — H2512 Age-related nuclear cataract, left eye: Secondary | ICD-10-CM | POA: Diagnosis not present

## 2017-05-02 DIAGNOSIS — H21511 Anterior synechiae (iris), right eye: Secondary | ICD-10-CM | POA: Diagnosis not present

## 2017-05-02 DIAGNOSIS — H26491 Other secondary cataract, right eye: Secondary | ICD-10-CM | POA: Diagnosis not present

## 2017-05-02 DIAGNOSIS — H33051 Total retinal detachment, right eye: Secondary | ICD-10-CM | POA: Diagnosis not present

## 2017-06-24 DIAGNOSIS — M1712 Unilateral primary osteoarthritis, left knee: Secondary | ICD-10-CM | POA: Diagnosis not present

## 2017-06-24 DIAGNOSIS — M5136 Other intervertebral disc degeneration, lumbar region: Secondary | ICD-10-CM | POA: Diagnosis not present

## 2017-06-24 DIAGNOSIS — R262 Difficulty in walking, not elsewhere classified: Secondary | ICD-10-CM | POA: Diagnosis not present

## 2017-08-08 DIAGNOSIS — R69 Illness, unspecified: Secondary | ICD-10-CM | POA: Diagnosis not present

## 2017-08-15 ENCOUNTER — Other Ambulatory Visit: Payer: Self-pay

## 2017-08-15 ENCOUNTER — Encounter: Payer: Self-pay | Admitting: Family Medicine

## 2017-08-15 ENCOUNTER — Ambulatory Visit (INDEPENDENT_AMBULATORY_CARE_PROVIDER_SITE_OTHER): Payer: Medicare HMO | Admitting: Family Medicine

## 2017-08-15 VITALS — BP 116/80 | HR 77 | Temp 98.4°F | Wt 302.0 lb

## 2017-08-15 DIAGNOSIS — M546 Pain in thoracic spine: Secondary | ICD-10-CM | POA: Diagnosis not present

## 2017-08-15 DIAGNOSIS — Z23 Encounter for immunization: Secondary | ICD-10-CM | POA: Diagnosis not present

## 2017-08-15 DIAGNOSIS — K089 Disorder of teeth and supporting structures, unspecified: Secondary | ICD-10-CM

## 2017-08-15 NOTE — Assessment & Plan Note (Addendum)
  Chronic lumbar back pain with acute thoracic back pain w/ muscle spasm. These episodes are infrequent and likely due to DJD of her spine. At this time would forgo adding medication like muscle relaxant. Advised supportive care. Offered PT referral but patient declined.  -handout with back rehab stretches and exercises given to patient -advised heating pad or muscle rub as needed  -continue aleve as needed -follow up as needed if pain worsens
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
## 2017-08-15 NOTE — Patient Instructions (Addendum)
  It was great seeing you today!  Please schedule an annual wellness visit with our nurse Lauren.  I'd recommend trying a heating pad or muscle rub on your back. Continue Aleve as needed. Add in some of the stretching and strengthening exercises below as well.   If you have questions or concerns please do not hesitate to call at [phone].  Lucila Maine, DO PGY-2, [facility] 08/15/2017 9:20 AM     Herniated Disk Rehab Ask your health care provider which exercises are safe for you. Do exercises exactly as told by your health care provider and adjust them as directed. It is normal to feel mild stretching, pulling, tightness, or discomfort as you do these exercises, but you should stop right away if you feel sudden pain or your pain gets worse.Do not begin these exercises until told by your health care provider. Stretching and range of motion exercises These exercises warm up your muscles and joints and improve the movement and flexibility of your back. These exercises also help to relieve pain, numbness, and tingling.  Exercise A: Prone extension on elbows  1. Lie on your abdomen on a firm surface. 2. Prop yourself up on your elbows. 3. Use your arms to help lift your chest up until you feel a gentle stretch in your abdomen and your lower back. ? This will place some of your body weight on your elbows. If this is uncomfortable, try stacking pillows under your chest. ? Your hips should stay down, against the surface that you are lying on. Keep your hip and back muscles relaxed. 4. Hold for __________ seconds. 5. Slowly relax your upper body and return to the starting position. Repeat __________ times. Complete this exercise __________ times a day.  Exercise B: Standing extension 1. Stand with your feet shoulder width apart. 2. Place your hands on your lower back, with your palms on your back. 3. Gently arch your back and press forward with your hands. 4. Hold for  __________ seconds. 5. Slowly return to the starting position. Repeat __________ times. Complete this exercise__________ times a day. Strengthening exercises These exercises build strength and endurance in your back. Endurance is the ability to use your muscles for a long time, even after they get tired. Exercise C: Pelvic tilt 1. Lie on your back on a firm surface. Bend your knees and keep your feet flat. 2. Tense your abdominal muscles. Tip your pelvis up toward the ceiling and flatten your lower back into the floor. ? To help with this exercise, you may place a small towel under your lower back and try to push your back into the towel. 3. Hold for __________ seconds. 4. Let your muscles relax completely before you repeat this exercise. Repeat __________ times. Complete this exercise __________ times a day.

## 2017-08-15 NOTE — Assessment & Plan Note (Signed)
  Patient currently on antibiotic per her dentist for dental abscess, needs to have tooth removed and has this scheduled in upcoming weeks  -follow up with oral surgeon as scheduled

## 2017-08-24 ENCOUNTER — Ambulatory Visit: Payer: Medicare HMO | Admitting: *Deleted

## 2017-08-30 ENCOUNTER — Ambulatory Visit: Payer: Medicare HMO | Admitting: *Deleted

## 2017-10-17 ENCOUNTER — Encounter (HOSPITAL_COMMUNITY): Payer: Self-pay | Admitting: Emergency Medicine

## 2017-10-17 ENCOUNTER — Ambulatory Visit (INDEPENDENT_AMBULATORY_CARE_PROVIDER_SITE_OTHER): Payer: Medicare Other

## 2017-10-17 ENCOUNTER — Ambulatory Visit (HOSPITAL_COMMUNITY)
Admission: EM | Admit: 2017-10-17 | Discharge: 2017-10-17 | Disposition: A | Discharge status: Home / Self Care | Payer: Medicare Other | Attending: Family Medicine | Admitting: Family Medicine

## 2017-10-17 DIAGNOSIS — S86911A Strain of unspecified muscle(s) and tendon(s) at lower leg level, right leg, initial encounter: Secondary | ICD-10-CM

## 2017-10-17 DIAGNOSIS — W19XXXA Unspecified fall, initial encounter: Secondary | ICD-10-CM | POA: Diagnosis not present

## 2017-10-17 DIAGNOSIS — M25561 Pain in right knee: Secondary | ICD-10-CM

## 2017-10-17 DIAGNOSIS — M25461 Effusion, right knee: Secondary | ICD-10-CM

## 2017-10-17 MED ORDER — DICLOFENAC SODIUM 75 MG PO TBEC: 75.0000 mg | DELAYED_RELEASE_TABLET | Freq: Two times a day (BID) | ORAL | 0 refills | Status: DC
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
## 2017-10-17 NOTE — ED Triage Notes (Signed)
PT fell yesterday and has right knee pain. PT reports she never struck her knee, but her right leg ended up underneath her.   PT reporst right knee is now stiff and painful.

## 2017-10-17 NOTE — Discharge Instructions (Signed)
Ice the knee three times a day  Gentle ROM

## 2017-11-03 DIAGNOSIS — M159 Polyosteoarthritis, unspecified: Secondary | ICD-10-CM | POA: Insufficient documentation
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
## 2017-11-04 ENCOUNTER — Encounter: Payer: Self-pay | Admitting: Family Medicine

## 2017-11-04 ENCOUNTER — Ambulatory Visit (INDEPENDENT_AMBULATORY_CARE_PROVIDER_SITE_OTHER): Payer: Medicare Other | Admitting: Family Medicine

## 2017-11-04 VITALS — BP 132/84 | HR 92 | Temp 98.5°F | Resp 12 | Ht 69.0 in | Wt 304.4 lb

## 2017-11-04 DIAGNOSIS — D170 Benign lipomatous neoplasm of skin and subcutaneous tissue of head, face and neck: Secondary | ICD-10-CM

## 2017-11-04 DIAGNOSIS — Z6841 Body Mass Index (BMI) 40.0 and over, adult: Secondary | ICD-10-CM

## 2017-11-04 DIAGNOSIS — M159 Polyosteoarthritis, unspecified: Secondary | ICD-10-CM | POA: Diagnosis not present

## 2017-11-04 DIAGNOSIS — I10 Essential (primary) hypertension: Secondary | ICD-10-CM | POA: Diagnosis not present

## 2017-11-04 DIAGNOSIS — M7541 Impingement syndrome of right shoulder: Secondary | ICD-10-CM | POA: Diagnosis not present

## 2017-11-04 LAB — COMPREHENSIVE METABOLIC PANEL
ALT: 9 U/L (ref 0–35)
AST: 10 U/L (ref 0–37)
Albumin: 4.3 g/dL (ref 3.5–5.2)
Alkaline Phosphatase: 92 U/L (ref 39–117)
BUN: 13 mg/dL (ref 6–23)
CALCIUM: 9.5 mg/dL (ref 8.4–10.5)
CHLORIDE: 106 meq/L (ref 96–112)
CO2: 28 mEq/L (ref 19–32)
Creatinine, Ser: 0.93 mg/dL (ref 0.40–1.20)
GFR: 77.95 mL/min (ref >60.00)
GLUCOSE: 91 mg/dL (ref 70–99)
POTASSIUM: 4 meq/L (ref 3.5–5.1)
Sodium: 142 mEq/L (ref 135–145)
Total Bilirubin: 0.6 mg/dL (ref 0.2–1.2)
Total Protein: 7 g/dL (ref 6.0–8.3)

## 2017-11-04 LAB — LIPID PANEL
CHOL/HDL RATIO: 4
Cholesterol: 157 mg/dL (ref 0–200)
HDL: 38.8 mg/dL — AB (ref >39.00)
LDL Cholesterol: 93 mg/dL (ref 0–99)
NONHDL: 117.98
TRIGLYCERIDES: 126 mg/dL (ref 0.0–149.0)
VLDL: 25.2 mg/dL (ref 0.0–40.0)

## 2017-11-04 MED ORDER — AMLODIPINE BESY-BENAZEPRIL HCL 5-20 MG PO CAPS: 1.0000 | ORAL_CAPSULE | Freq: Every day | ORAL | 1 refills | Status: DC

## 2017-11-04 NOTE — Assessment & Plan Note (Signed)
We discussed benefits of wt loss as well as adverse effects of obesity. Consistency with healthy diet and physical activity recommended. She is already exercising regularly, encouraged to continue. Recommend decreasing amount of candy.

## 2017-11-04 NOTE — Assessment & Plan Note (Signed)
Adequately controlled. No changes in current management. DASH-lowsal diet recommended. Eye exam is due. F/U in 6 months, before if needed.

## 2017-11-04 NOTE — Assessment & Plan Note (Signed)
She is not interested in surgical evaluation for now. She will continue to monitor for changes. Follow-up as needed.

## 2017-11-04 NOTE — Patient Instructions (Addendum)
A few things to remember from today's visit:   Essential hypertension - Plan: Comprehensive metabolic panel, Lipid panel, amLODipine-benazepril (LOTREL) 5-20 MG capsule  Generalized osteoarthritis of multiple sites  Recommend OTC Tylenol 500 mg 3-4 times per day and Aleve 220 mg up to 2 tiems per day as far as BP is controlled and renal function is good. Low impact exercise. Icy hot or similar products may also help. Pool exercises may also help.    DASH Eating Plan DASH stands for "Dietary Approaches to Stop Hypertension." The DASH eating plan is a healthy eating plan that has been shown to reduce high blood pressure (hypertension). It may also reduce your risk for type 2 diabetes, heart disease, and stroke. The DASH eating plan may also help with weight loss. What are tips for following this plan? General guidelines  Avoid eating more than 2,300 mg (milligrams) of salt (sodium) a day. If you have hypertension, you may need to reduce your sodium intake to 1,500 mg a day.  Limit alcohol intake to no more than 1 drink a day for nonpregnant women and 2 drinks a day for men. One drink equals 12 oz of beer, 5 oz of wine, or 1 oz of hard liquor.  Work with your health care provider to maintain a healthy body weight or to lose weight. Ask what an ideal weight is for you.  Get at least 30 minutes of exercise that causes your heart to beat faster (aerobic exercise) most days of the week. Activities may include walking, swimming, or biking.  Work with your health care provider or diet and nutrition specialist (dietitian) to adjust your eating plan to your individual calorie needs. Reading food labels  Check food labels for the amount of sodium per serving. Choose foods with less than 5 percent of the Daily Value of sodium. Generally, foods with less than 300 mg of sodium per serving fit into this eating plan.  To find whole grains, look for the word "whole" as the first word in the ingredient  list. Shopping  Buy products labeled as "low-sodium" or "no salt added."  Buy fresh foods. Avoid canned foods and premade or frozen meals. Cooking  Avoid adding salt when cooking. Use salt-free seasonings or herbs instead of table salt or sea salt. Check with your health care provider or pharmacist before using salt substitutes.  Do not fry foods. Cook foods using healthy methods such as baking, boiling, grilling, and broiling instead.  Cook with heart-healthy oils, such as olive, canola, soybean, or sunflower oil. Meal planning   Eat a balanced diet that includes: ? 5 or more servings of fruits and vegetables each day. At each meal, try to fill half of your plate with fruits and vegetables. ? Up to 6-8 servings of whole grains each day. ? Less than 6 oz of lean meat, poultry, or fish each day. A 3-oz serving of meat is about the same size as a deck of cards. One egg equals 1 oz. ? 2 servings of low-fat dairy each day. ? A serving of nuts, seeds, or beans 5 times each week. ? Heart-healthy fats. Healthy fats called Omega-3 fatty acids are found in foods such as flaxseeds and coldwater fish, like sardines, salmon, and mackerel.  Limit how much you eat of the following: ? Canned or prepackaged foods. ? Food that is high in trans fat, such as fried foods. ? Food that is high in saturated fat, such as fatty meat. ? Sweets, desserts, sugary  drinks, and other foods with added sugar. ? Full-fat dairy products.  Do not salt foods before eating.  Try to eat at least 2 vegetarian meals each week.  Eat more home-cooked food and less restaurant, buffet, and fast food.  When eating at a restaurant, ask that your food be prepared with less salt or no salt, if possible. What foods are recommended? The items listed may not be a complete list. Talk with your dietitian about what dietary choices are best for you. Grains Whole-grain or whole-wheat bread. Whole-grain or whole-wheat pasta. Brown  rice. [place]. Bulgur. Whole-grain and low-sodium cereals. Pita bread. Low-fat, low-sodium crackers. Whole-wheat flour tortillas. Vegetables Fresh or frozen vegetables (raw, steamed, roasted, or grilled). Low-sodium or reduced-sodium tomato and vegetable juice. Low-sodium or reduced-sodium tomato sauce and tomato paste. Low-sodium or reduced-sodium canned vegetables. Fruits All fresh, dried, or frozen fruit. Canned fruit in natural juice (without added sugar). Meat and other protein foods Skinless chicken or [place]. Ground chicken or [place]. Pork with fat trimmed off. Fish and seafood. Egg whites. Dried beans, peas, or lentils. Unsalted nuts, nut butters, and seeds. Unsalted canned beans. Lean cuts of beef with fat trimmed off. Low-sodium, lean deli meat. Dairy Low-fat (1%) or fat-free (skim) milk. Fat-free, low-fat, or reduced-fat cheeses. Nonfat, low-sodium ricotta or cottage cheese. Low-fat or nonfat yogurt. Low-fat, low-sodium cheese. Fats and oils Soft margarine without trans fats. Vegetable oil. Low-fat, reduced-fat, or light mayonnaise and salad dressings (reduced-sodium). Canola, safflower, olive, soybean, and sunflower oils. Avocado. Seasoning and other foods Herbs. Spices. Seasoning mixes without salt. Unsalted popcorn and pretzels. Fat-free sweets. What foods are not recommended? The items listed may not be a complete list. Talk with your dietitian about what dietary choices are best for you. Grains Baked goods made with fat, such as croissants, muffins, or some breads. Dry pasta or rice meal packs. Vegetables Creamed or fried vegetables. Vegetables in a cheese sauce. Regular canned vegetables (not low-sodium or reduced-sodium). Regular canned tomato sauce and paste (not low-sodium or reduced-sodium). Regular tomato and vegetable juice (not low-sodium or reduced-sodium). Angie Fava. Olives. Fruits Canned fruit in a light or heavy syrup. Fried fruit. Fruit in cream or butter  sauce. Meat and other protein foods Fatty cuts of meat. Ribs. Fried meat. Berniece Salines. Sausage. Bologna and other processed lunch meats. Salami. Fatback. Hotdogs. Bratwurst. Salted nuts and seeds. Canned beans with added salt. Canned or smoked fish. Whole eggs or egg yolks. Chicken or [place] with skin. Dairy Whole or 2% milk, cream, and half-and-half. Whole or full-fat cream cheese. Whole-fat or sweetened yogurt. Full-fat cheese. Nondairy creamers. Whipped toppings. Processed cheese and cheese spreads. Fats and oils Butter. Stick margarine. Lard. Shortening. Ghee. Bacon fat. Tropical oils, such as coconut, palm kernel, or palm oil. Seasoning and other foods Salted popcorn and pretzels. Onion salt, garlic salt, seasoned salt, table salt, and sea salt. Worcestershire sauce. Tartar sauce. Barbecue sauce. Teriyaki sauce. Soy sauce, including reduced-sodium. Steak sauce. Canned and packaged gravies. Fish sauce. Oyster sauce. Cocktail sauce. Horseradish that you find on the shelf. Ketchup. Mustard. Meat flavorings and tenderizers. Bouillon cubes. Hot sauce and Tabasco sauce. Premade or packaged marinades. Premade or packaged taco seasonings. Relishes. Regular salad dressings. Where to find more information:  National Heart, Lung, and [place]: https://wilson-eaton.com/  American Heart Association: www.heart.org Summary  The DASH eating plan is a healthy eating plan that has been shown to reduce high blood pressure (hypertension). It may also reduce your risk for type 2 diabetes, heart disease, and  stroke.  With the DASH eating plan, you should limit salt (sodium) intake to 2,300 mg a day. If you have hypertension, you may need to reduce your sodium intake to 1,500 mg a day.  When on the DASH eating plan, aim to eat more fresh fruits and vegetables, whole grains, lean proteins, low-fat dairy, and heart-healthy fats.  Work with your health care provider or diet and nutrition specialist (dietitian) to adjust  your eating plan to your individual calorie needs. This information is not intended to replace advice given to you by your health care provider. Make sure you discuss any questions you have with your health care provider. Document Released: 08/26/2011 Document Revised: 08/30/2016 Document Reviewed: 08/30/2016 Elsevier Interactive Patient Education  [employer].  Please be sure medication list is accurate. If a new problem present, please set up appointment sooner than planned today.

## 2017-11-04 NOTE — Assessment & Plan Note (Addendum)
Recommend OTC Tylenol 500 mg 3-4 times per day and Aleve 220 mg up to 2 tiems per day as far as BP is controlled and renal function is good. Low impact exercise. Icy hot or similar products may also help as well as pool exercises.

## 2017-11-05 ENCOUNTER — Encounter: Payer: Self-pay | Admitting: Family Medicine

## 2017-11-08 ENCOUNTER — Telehealth: Payer: Self-pay | Admitting: Family Medicine

## 2017-11-08 NOTE — Telephone Encounter (Signed)
Copied from [place] [id number]. Topic: Inquiry >> Nov 08, 2017  2:43 PM Corie Chiquito, [place] wrote: Reason for CRM: [facility] called on behalf of the patient. Patient is requesting to have an AWV done. If someone could give her a call back about this so she can be put on the schedule for the AWV. She can be reached at [phone]. Patient established care with Dr.Jordan on 11-04-2017

## 2018-01-04 ENCOUNTER — Ambulatory Visit: Payer: Medicare Other | Admitting: Podiatry

## 2018-01-20 ENCOUNTER — Encounter: Payer: Self-pay | Admitting: Family Medicine

## 2018-01-20 ENCOUNTER — Ambulatory Visit (INDEPENDENT_AMBULATORY_CARE_PROVIDER_SITE_OTHER): Payer: Medicare Other | Admitting: Family Medicine

## 2018-01-20 VITALS — BP 112/72 | HR 81 | Temp 98.8°F | Ht 69.0 in | Wt 303.6 lb

## 2018-01-20 DIAGNOSIS — J302 Other seasonal allergic rhinitis: Secondary | ICD-10-CM | POA: Diagnosis not present

## 2018-01-20 MED ORDER — BENZONATATE 100 MG PO CAPS: 100.0000 mg | ORAL_CAPSULE | Freq: Two times a day (BID) | ORAL | 0 refills | Status: DC | PRN

## 2018-01-20 MED ORDER — FLUTICASONE PROPIONATE 50 MCG/ACT NA SUSP
1.0000 | Freq: Every day | NASAL | 3 refills | Status: DC
Start: 2018-01-20 — End: 2018-10-23

## 2018-01-20 MED ORDER — CETIRIZINE HCL 10 MG PO TABS
10.0000 mg | ORAL_TABLET | Freq: Every day | ORAL | 0 refills | Status: DC
Start: 2018-01-20 — End: 2018-06-13
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
## 2018-01-20 NOTE — Patient Instructions (Signed)

## 2018-02-17 ENCOUNTER — Other Ambulatory Visit: Payer: Self-pay | Admitting: Family Medicine

## 2018-02-17 DIAGNOSIS — Z1231 Encounter for screening mammogram for malignant neoplasm of breast: Secondary | ICD-10-CM

## 2018-03-13 ENCOUNTER — Ambulatory Visit
Admission: RE | Admit: 2018-03-13 | Discharge: 2018-03-13 | Disposition: A | Discharge status: Home / Self Care | Payer: Medicare Other | Source: Ambulatory Visit | Attending: Family Medicine | Admitting: Family Medicine

## 2018-03-13 DIAGNOSIS — Z1231 Encounter for screening mammogram for malignant neoplasm of breast: Secondary | ICD-10-CM | POA: Diagnosis not present

## 2018-04-19 ENCOUNTER — Telehealth: Payer: Self-pay | Admitting: Family Medicine

## 2018-04-19 NOTE — Telephone Encounter (Signed)
Message sent to Dr. Jordan for review and approval. 

## 2018-04-19 NOTE — Telephone Encounter (Signed)
Copied from [place] [id number]. Topic: General - Other >> Apr 19, 2018  9:19 AM Carolyn Stare wrote: [place] said pt told them she use to have to have a antibiotic before any dental treatment and they called today to verify if she still needs to have an antibiotic before any dental treatment    Can speak with anyone at the front desk  [phone]

## 2018-04-25 NOTE — Telephone Encounter (Signed)
I do not see indications for endocarditis prophylaxis when reviewing records.  She has no Hx of endocarditis or valvular disease.But if her dentist is recommending abx prophylaxis it is appropriate for he/she to prescribe it.  Thanks, BJ
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
## 2018-04-25 NOTE — Telephone Encounter (Signed)
Left message for [facility] to give me a call back concerning patient.

## 2018-04-26 ENCOUNTER — Ambulatory Visit (INDEPENDENT_AMBULATORY_CARE_PROVIDER_SITE_OTHER): Payer: Medicare Other

## 2018-04-26 VITALS — BP 108/70 | HR 80 | Ht 69.0 in | Wt 301.0 lb

## 2018-04-26 DIAGNOSIS — Z0000 Encounter for general adult medical examination without abnormal findings: Secondary | ICD-10-CM | POA: Diagnosis not present

## 2018-04-26 NOTE — Patient Instructions (Addendum)
Sheila Hartman , Thank you for taking time to come for your Medicare Wellness Visit. I appreciate your ongoing commitment to your health goals. Please review the following plan we discussed and let me know if I can assist you in the future.  A Tetanus is recommended every 10 years. Medicare covers a tetanus if you have a cut or wound; otherwise, there may be a charge. If you had not had a tetanus with pertusses, known as the Tdap, you can take this anytime.   Keep in mind the flu shot is an inactivated vaccine and takes at least 2 weeks to build immunity. The flu virus can be dormant for 4 days prior to symptoms Taking the flu shot at the beginning of the season can reduce the risk for the entire community.    Shingrix is a vaccine for the prevention of Shingles in Adults 50 and older.  If you are on Medicare, the shingrix is covered under your Part D plan, so you will take both of the vaccines in the series at your pharmacy. Please check with your benefits regarding applicable copays or out of pocket expenses.  The Shingrix is given in 2 vaccines approx 8 weeks apart. You must receive the 2nd dose prior to 6 months from receipt of the first. Please have the pharmacist print out you Immunization  dates for our office records      These are the goals we discussed: Goals    . Blood Pressure < 140/90    . Exercise 150 min/wk Moderate Activity     Started walking 30 minutes 3 days at the gym     . Weight (lb) < 284 lb (128.8 kg)     7% weight loss       This is a list of the screening recommended for you and due dates:  Health Maintenance  Topic Date Due  . Tetanus Vaccine  05/24/1972  . Flu Shot  04/20/2018  . Colon Cancer Screening  05/21/2018  . Mammogram  03/13/2020  .  Hepatitis C: One time screening is recommended by Center for Disease Control  (CDC) for  adults born from [birth year] through 1963.   Completed  . HIV Screening  Completed  . Pap Smear  Discontinued      Fall  Prevention in the Home Falls can cause injuries. They can happen to people of all ages. There are many things you can do to make your home safe and to help prevent falls. What can I do on the outside of my home?  Regularly fix the edges of walkways and driveways and fix any cracks.  Remove anything that might make you trip as you walk through a door, such as a raised step or threshold.  Trim any bushes or trees on the path to your home.  Use bright outdoor lighting.  Clear any walking paths of anything that might make someone trip, such as rocks or tools.  Regularly check to see if handrails are loose or broken. Make sure that both sides of any steps have handrails.  Any raised decks and porches should have guardrails on the edges.  Have any leaves, snow, or ice cleared regularly.  Use sand or salt on walking paths during winter.  Clean up any spills in your garage right away. This includes oil or grease spills. What can I do in the bathroom?  Use night lights.  Install grab bars by the toilet and in the tub and shower. Do not  use towel bars as grab bars.  Use non-skid mats or decals in the tub or shower.  If you need to sit down in the shower, use a plastic, non-slip stool.  Keep the floor dry. Clean up any water that spills on the floor as soon as it happens.  Remove soap buildup in the tub or shower regularly.  Attach bath mats securely with double-sided non-slip rug tape.  Do not have throw rugs and other things on the floor that can make you trip. What can I do in the bedroom?  Use night lights.  Make sure that you have a light by your bed that is easy to reach.  Do not use any sheets or blankets that are too big for your bed. They should not hang down onto the floor.  Have a firm chair that has side arms. You can use this for support while you get dressed.  Do not have throw rugs and other things on the floor that can make you trip. What can I do in the  kitchen?  Clean up any spills right away.  Avoid walking on wet floors.  Keep items that you use a lot in easy-to-reach places.  If you need to reach something above you, use a strong step stool that has a grab bar.  Keep electrical cords out of the way.  Do not use floor polish or wax that makes floors slippery. If you must use wax, use non-skid floor wax.  Do not have throw rugs and other things on the floor that can make you trip. What can I do with my stairs?  Do not leave any items on the stairs.  Make sure that there are handrails on both sides of the stairs and use them. Fix handrails that are broken or loose. Make sure that handrails are as long as the stairways.  Check any carpeting to make sure that it is firmly attached to the stairs. Fix any carpet that is loose or worn.  Avoid having throw rugs at the top or bottom of the stairs. If you do have throw rugs, attach them to the floor with carpet tape.  Make sure that you have a light switch at the top of the stairs and the bottom of the stairs. If you do not have them, ask someone to add them for you. What else can I do to help prevent falls?  Wear shoes that: ? Do not have high heels. ? Have rubber bottoms. ? Are comfortable and fit you well. ? Are closed at the toe. Do not wear sandals.  If you use a stepladder: ? Make sure that it is fully opened. Do not climb a closed stepladder. ? Make sure that both sides of the stepladder are locked into place. ? Ask someone to hold it for you, if possible.  Clearly mark and make sure that you can see: ? Any grab bars or handrails. ? First and last steps. ? Where the edge of each step is.  Use tools that help you move around (mobility aids) if they are needed. These include: ? Canes. ? Walkers. ? Scooters. ? Crutches.  Turn on the lights when you go into a dark area. Replace any light bulbs as soon as they burn out.  Set up your furniture so you have a clear path.  Avoid moving your furniture around.  If any of your floors are uneven, fix them.  If there are any pets around you, be aware of  where they are.  Review your medicines with your doctor. Some medicines can make you feel dizzy. This can increase your chance of falling. Ask your doctor what other things that you can do to help prevent falls. This information is not intended to replace advice given to you by your health care provider. Make sure you discuss any questions you have with your health care provider. Document Released: 07/03/2009 Document Revised: 02/12/2016 Document Reviewed: 10/11/2014 Elsevier Interactive Patient Education  2018 [place] Maintenance, Female Adopting a healthy lifestyle and getting preventive care can go a long way to promote health and wellness. Talk with your health care provider about what schedule of regular examinations is right for you. This is a good chance for you to check in with your provider about disease prevention and staying healthy. In between checkups, there are plenty of things you can do on your own. Experts have done a lot of research about which lifestyle changes and preventive measures are most likely to keep you healthy. Ask your health care provider for more information. Weight and diet Eat a healthy diet  Be sure to include plenty of vegetables, fruits, low-fat dairy products, and lean protein.  Do not eat a lot of foods high in solid fats, added sugars, or salt.  Get regular exercise. This is one of the most important things you can do for your health. ? Most adults should exercise for at least 150 minutes each week. The exercise should increase your heart rate and make you sweat (moderate-intensity exercise). ? Most adults should also do strengthening exercises at least twice a week. This is in addition to the moderate-intensity exercise.  Maintain a healthy weight  Body mass index (BMI) is a measurement that can be used to  identify possible weight problems. It estimates body fat based on height and weight. Your health care provider can help determine your BMI and help you achieve or maintain a healthy weight.  For females 86 years of age and older: ? A BMI below 18.5 is considered underweight. ? A BMI of 18.5 to 24.9 is normal. ? A BMI of 25 to 29.9 is considered overweight. ? A BMI of 30 and above is considered obese.  Watch levels of cholesterol and blood lipids  You should start having your blood tested for lipids and cholesterol at 65 years of age, then have this test every 5 years.  You may need to have your cholesterol levels checked more often if: ? Your lipid or cholesterol levels are high. ? You are older than 65 years of age. ? You are at high risk for heart disease.  Cancer screening Lung Cancer  Lung cancer screening is recommended for adults 26-19 years old who are at high risk for lung cancer because of a history of smoking.  A yearly low-dose CT scan of the lungs is recommended for people who: ? Currently smoke. ? Have quit within the past 15 years. ? Have at least a 30-pack-year history of smoking. A pack year is smoking an average of one pack of cigarettes a day for 1 year.  Yearly screening should continue until it has been 15 years since you quit.  Yearly screening should stop if you develop a health problem that would prevent you from having lung cancer treatment.  Breast Cancer  Practice breast self-awareness. This means understanding how your breasts normally appear and feel.  It also means doing regular breast self-exams. Let your health care provider know about  any changes, no matter how small.  If you are in your 20s or 30s, you should have a clinical breast exam (CBE) by a health care provider every 1-3 years as part of a regular health exam.  If you are 27 or older, have a CBE every year. Also consider having a breast X-ray (mammogram) every year.  If you have a  family history of breast cancer, talk to your health care provider about genetic screening.  If you are at high risk for breast cancer, talk to your health care provider about having an MRI and a mammogram every year.  Breast cancer gene (BRCA) assessment is recommended for women who have family members with BRCA-related cancers. BRCA-related cancers include: ? Breast. ? Ovarian. ? Tubal. ? Peritoneal cancers.  Results of the assessment will determine the need for genetic counseling and BRCA1 and BRCA2 testing.  Cervical Cancer Your health care provider may recommend that you be screened regularly for cancer of the pelvic organs (ovaries, uterus, and vagina). This screening involves a pelvic examination, including checking for microscopic changes to the surface of your cervix (Pap test). You may be encouraged to have this screening done every 3 years, beginning at age 7.  For women ages 28-65, health care providers may recommend pelvic exams and Pap testing every 3 years, or they may recommend the Pap and pelvic exam, combined with testing for human papilloma virus (HPV), every 5 years. Some types of HPV increase your risk of cervical cancer. Testing for HPV may also be done on women of any age with unclear Pap test results.  Other health care providers may not recommend any screening for nonpregnant women who are considered low risk for pelvic cancer and who do not have symptoms. Ask your health care provider if a screening pelvic exam is right for you.  If you have had past treatment for cervical cancer or a condition that could lead to cancer, you need Pap tests and screening for cancer for at least 20 years after your treatment. If Pap tests have been discontinued, your risk factors (such as having a new sexual partner) need to be reassessed to determine if screening should resume. Some women have medical problems that increase the chance of getting cervical cancer. In these cases, your  health care provider may recommend more frequent screening and Pap tests.  Colorectal Cancer  This type of cancer can be detected and often prevented.  Routine colorectal cancer screening usually begins at 65 years of age and continues through 65 years of age.  Your health care provider may recommend screening at an earlier age if you have risk factors for colon cancer.  Your health care provider may also recommend using home test kits to check for hidden blood in the stool.  A small camera at the end of a tube can be used to examine your colon directly (sigmoidoscopy or colonoscopy). This is done to check for the earliest forms of colorectal cancer.  Routine screening usually begins at age 37.  Direct examination of the colon should be repeated every 5-10 years through 65 years of age. However, you may need to be screened more often if early forms of precancerous polyps or small growths are found.  Skin Cancer  Check your skin from head to toe regularly.  Tell your health care provider about any new moles or changes in moles, especially if there is a change in a mole's shape or color.  Also tell your health care provider  if you have a mole that is larger than the size of a pencil eraser.  Always use sunscreen. Apply sunscreen liberally and repeatedly throughout the day.  Protect yourself by wearing long sleeves, pants, a wide-brimmed hat, and sunglasses whenever you are outside.  Heart disease, diabetes, and high blood pressure  High blood pressure causes heart disease and increases the risk of stroke. High blood pressure is more likely to develop in: ? People who have blood pressure in the high end of the normal range (130-139/85-89 mm Hg). ? People who are overweight or obese. ? People who are African American.  If you are 67-13 years of age, have your blood pressure checked every 3-5 years. If you are 32 years of age or older, have your blood pressure checked every year. You  should have your blood pressure measured twice-once when you are at a hospital or clinic, and once when you are not at a hospital or clinic. Record the average of the two measurements. To check your blood pressure when you are not at a hospital or clinic, you can use: ? An automated blood pressure machine at a pharmacy. ? A home blood pressure monitor.  If you are between 81 years and 63 years old, ask your health care provider if you should take aspirin to prevent strokes.  Have regular diabetes screenings. This involves taking a blood sample to check your fasting blood sugar level. ? If you are at a normal weight and have a low risk for diabetes, have this test once every three years after 65 years of age. ? If you are overweight and have a high risk for diabetes, consider being tested at a younger age or more often. Preventing infection Hepatitis B  If you have a higher risk for hepatitis B, you should be screened for this virus. You are considered at high risk for hepatitis B if: ? You were born in a country where hepatitis B is common. Ask your health care provider which countries are considered high risk. ? Your parents were born in a high-risk country, and you have not been immunized against hepatitis B (hepatitis B vaccine). ? You have HIV or AIDS. ? You use needles to inject street drugs. ? You live with someone who has hepatitis B. ? You have had sex with someone who has hepatitis B. ? You get hemodialysis treatment. ? You take certain medicines for conditions, including cancer, organ transplantation, and autoimmune conditions.  Hepatitis C  Blood testing is recommended for: ? Everyone born from [birth year] through 1963. ? Anyone with known risk factors for hepatitis C.  Sexually transmitted infections (STIs)  You should be screened for sexually transmitted infections (STIs) including gonorrhea and chlamydia if: ? You are sexually active and are younger than 65 years of age. ? You  are older than 65 years of age and your health care provider tells you that you are at risk for this type of infection. ? Your sexual activity has changed since you were last screened and you are at an increased risk for chlamydia or gonorrhea. Ask your health care provider if you are at risk.  If you do not have HIV, but are at risk, it may be recommended that you take a prescription medicine daily to prevent HIV infection. This is called pre-exposure prophylaxis (PrEP). You are considered at risk if: ? You are sexually active and do not regularly use condoms or know the HIV status of your partner(s). ? You take drugs  by injection. ? You are sexually active with a partner who has HIV.  Talk with your health care provider about whether you are at high risk of being infected with HIV. If you choose to begin PrEP, you should first be tested for HIV. You should then be tested every 3 months for as long as you are taking PrEP. Pregnancy  If you are premenopausal and you may become pregnant, ask your health care provider about preconception counseling.  If you may become pregnant, take 400 to 800 micrograms (mcg) of folic acid every day.  If you want to prevent pregnancy, talk to your health care provider about birth control (contraception). Osteoporosis and menopause  Osteoporosis is a disease in which the bones lose minerals and strength with aging. This can result in serious bone fractures. Your risk for osteoporosis can be identified using a bone density scan.  If you are 68 years of age or older, or if you are at risk for osteoporosis and fractures, ask your health care provider if you should be screened.  Ask your health care provider whether you should take a calcium or vitamin D supplement to lower your risk for osteoporosis.  Menopause may have certain physical symptoms and risks.  Hormone replacement therapy may reduce some of these symptoms and risks. Talk to your health care  provider about whether hormone replacement therapy is right for you. Follow these instructions at home:  Schedule regular health, dental, and eye exams.  Stay current with your immunizations.  Do not use any tobacco products including cigarettes, chewing tobacco, or electronic cigarettes.  If you are pregnant, do not drink alcohol.  If you are breastfeeding, limit how much and how often you drink alcohol.  Limit alcohol intake to no more than 1 drink per day for nonpregnant women. One drink equals 12 ounces of beer, 5 ounces of wine, or 1 ounces of hard liquor.  Do not use street drugs.  Do not share needles.  Ask your health care provider for help if you need support or information about quitting drugs.  Tell your health care provider if you often feel depressed.  Tell your health care provider if you have ever been abused or do not feel safe at home. This information is not intended to replace advice given to you by your health care provider. Make sure you discuss any questions you have with your health care provider. Document Released: 03/22/2011 Document Revised: 02/12/2016 Document Reviewed: 06/10/2015 Elsevier Interactive Patient Education  [employer].

## 2018-05-01 NOTE — Telephone Encounter (Signed)
[facility] gave instructions per Dr. [place]. She stated that she would give me a call back after talking with Dr.

## 2018-05-02 NOTE — Progress Notes (Signed)
I have reviewed documentation from this visit and I agree with recommendations given.  Betty G. Jordan, MD  [facility]. [place] office.   

## 2018-06-13 ENCOUNTER — Ambulatory Visit (INDEPENDENT_AMBULATORY_CARE_PROVIDER_SITE_OTHER): Payer: Medicare Other | Admitting: Family Medicine

## 2018-06-13 ENCOUNTER — Encounter: Payer: Self-pay | Admitting: Family Medicine

## 2018-06-13 VITALS — BP 124/82 | HR 87 | Temp 98.4°F | Resp 16 | Ht 69.0 in | Wt 294.2 lb

## 2018-06-13 DIAGNOSIS — M545 Low back pain, unspecified: Secondary | ICD-10-CM

## 2018-06-13 DIAGNOSIS — Z1211 Encounter for screening for malignant neoplasm of colon: Secondary | ICD-10-CM | POA: Diagnosis not present

## 2018-06-13 DIAGNOSIS — Z23 Encounter for immunization: Secondary | ICD-10-CM | POA: Diagnosis not present

## 2018-06-13 MED ORDER — TIZANIDINE HCL 4 MG PO TABS: 4.0000 mg | ORAL_TABLET | Freq: Every day | ORAL | 0 refills | Status: AC

## 2018-06-13 MED ORDER — MELOXICAM 7.5 MG PO TABS: 7.5000 mg | ORAL_TABLET | Freq: Two times a day (BID) | ORAL | 0 refills | Status: AC | PRN

## 2018-06-13 NOTE — Patient Instructions (Addendum)
A few things to remember from today's visit:   Colon cancer screening - Plan: Cologuard  Acute right-sided low back pain without sciatica - Plan: meloxicam (MOBIC) 7.5 MG tablet, tiZANidine (ZANAFLEX) 4 MG tablet  Over-the-counter IcyHot patch on affected area may also help. Local ice. Be careful with Zanaflex, he can cause drowsiness. Monitor your blood pressure because Mobic. Monitor for rash or new symptoms.  Please be sure medication list is accurate. If a new problem present, please set up appointment sooner than planned today.
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
## 2018-08-23 DIAGNOSIS — Z1211 Encounter for screening for malignant neoplasm of colon: Secondary | ICD-10-CM | POA: Diagnosis not present

## 2018-08-23 LAB — COLOGUARD

## 2018-09-04 DIAGNOSIS — H31092 Other chorioretinal scars, left eye: Secondary | ICD-10-CM | POA: Diagnosis not present

## 2018-09-04 DIAGNOSIS — Q148 Other congenital malformations of posterior segment of eye: Secondary | ICD-10-CM | POA: Diagnosis not present

## 2018-09-04 DIAGNOSIS — H35412 Lattice degeneration of retina, left eye: Secondary | ICD-10-CM | POA: Diagnosis not present

## 2018-09-04 DIAGNOSIS — D3132 Benign neoplasm of left choroid: Secondary | ICD-10-CM | POA: Diagnosis not present

## 2018-09-04 DIAGNOSIS — E70319 Ocular albinism, unspecified: Secondary | ICD-10-CM | POA: Diagnosis not present

## 2018-10-23 ENCOUNTER — Telehealth: Payer: Self-pay | Admitting: *Deleted

## 2018-10-23 ENCOUNTER — Encounter: Payer: Self-pay | Admitting: Family Medicine

## 2018-10-23 ENCOUNTER — Ambulatory Visit (INDEPENDENT_AMBULATORY_CARE_PROVIDER_SITE_OTHER): Payer: Medicare Other | Admitting: Family Medicine

## 2018-10-23 VITALS — BP 102/76 | HR 96 | Temp 97.9°F | Resp 12 | Ht 68.25 in | Wt 301.2 lb

## 2018-10-23 DIAGNOSIS — M159 Polyosteoarthritis, unspecified: Secondary | ICD-10-CM

## 2018-10-23 DIAGNOSIS — E785 Hyperlipidemia, unspecified: Secondary | ICD-10-CM | POA: Diagnosis not present

## 2018-10-23 DIAGNOSIS — Z0000 Encounter for general adult medical examination without abnormal findings: Secondary | ICD-10-CM | POA: Diagnosis not present

## 2018-10-23 DIAGNOSIS — I10 Essential (primary) hypertension: Secondary | ICD-10-CM

## 2018-10-23 DIAGNOSIS — Z780 Asymptomatic menopausal state: Secondary | ICD-10-CM

## 2018-10-23 DIAGNOSIS — D649 Anemia, unspecified: Secondary | ICD-10-CM

## 2018-10-23 DIAGNOSIS — Z23 Encounter for immunization: Secondary | ICD-10-CM

## 2018-10-23 DIAGNOSIS — D509 Iron deficiency anemia, unspecified: Secondary | ICD-10-CM | POA: Insufficient documentation

## 2018-10-23 LAB — COMPREHENSIVE METABOLIC PANEL
ALT: 11 U/L (ref 0–35)
AST: 11 U/L (ref 0–37)
Albumin: 4.3 g/dL (ref 3.5–5.2)
Alkaline Phosphatase: 78 U/L (ref 39–117)
BUN: 15 mg/dL (ref 6–23)
CO2: 27 mEq/L (ref 19–32)
Calcium: 9.7 mg/dL (ref 8.4–10.5)
Chloride: 105 mEq/L (ref 96–112)
Creatinine, Ser: 0.88 mg/dL (ref 0.40–1.20)
GFR: 77.93 mL/min (ref >60.00)
GLUCOSE: 82 mg/dL (ref 70–99)
Potassium: 4 mEq/L (ref 3.5–5.1)
Sodium: 140 mEq/L (ref 135–145)
Total Bilirubin: 0.5 mg/dL (ref 0.2–1.2)
Total Protein: 7.1 g/dL (ref 6.0–8.3)

## 2018-10-23 LAB — CBC WITH DIFFERENTIAL/PLATELET
BASOS ABS: 0 10*3/uL (ref 0.0–0.1)
Basophils Relative: 0.5 % (ref 0.0–3.0)
Eosinophils Absolute: 0.1 10*3/uL (ref 0.0–0.7)
Eosinophils Relative: 0.9 % (ref 0.0–5.0)
HCT: 26.8 % — ABNORMAL LOW (ref 36.0–46.0)
Hemoglobin: 8 g/dL — CL (ref 12.0–15.0)
Lymphocytes Relative: 16.2 % (ref 12.0–46.0)
Lymphs Abs: 1.2 10*3/uL (ref 0.7–4.0)
MCHC: 29.6 g/dL — ABNORMAL LOW (ref 30.0–36.0)
MCV: 59.6 fl — AB (ref 78.0–100.0)
Monocytes Absolute: 0.7 10*3/uL (ref 0.1–1.0)
Monocytes Relative: 9.1 % (ref 3.0–12.0)
NEUTROS ABS: 5.5 10*3/uL (ref 1.4–7.7)
Neutrophils Relative %: 73.3 % (ref 43.0–77.0)
Platelets: 340 10*3/uL (ref 150.0–400.0)
RBC: 4.5 Mil/uL (ref 3.87–5.11)
RDW: 18.7 % — ABNORMAL HIGH (ref 11.5–15.5)
WBC: 7.5 10*3/uL (ref 4.0–10.5)

## 2018-10-23 LAB — LIPID PANEL
Cholesterol: 135 mg/dL (ref 0–200)
HDL: 33.9 mg/dL — ABNORMAL LOW (ref >39.00)
LDL Cholesterol: 80 mg/dL (ref 0–99)
NonHDL: 101.22
Total CHOL/HDL Ratio: 4
Triglycerides: 105 mg/dL (ref 0.0–149.0)
VLDL: 21 mg/dL (ref 0.0–40.0)

## 2018-10-23 MED ORDER — AMLODIPINE BESY-BENAZEPRIL HCL 5-20 MG PO CAPS: 1.0000 | ORAL_CAPSULE | Freq: Every day | ORAL | 2 refills | Status: DC

## 2018-10-23 NOTE — Patient Instructions (Addendum)
A few things to remember from today's visit:   Routine general medical examination at a health care facility  Essential hypertension - Plan: CBC with Differential, Comprehensive metabolic panel  Generalized osteoarthritis of multiple sites  Asymptomatic postmenopausal estrogen deficiency - Plan: DEXAScan  Dyslipidemia - Plan: Lipid panel  A few tips:  -As we age balance is not as good as it was, so there is a higher risks for falls. Please remove small rugs and furniture that is "in your way" and could increase the risk of falls. Stretching exercises may help with fall prevention: Yoga and Tai Chi are some examples. Low impact exercise is better, so you are not very achy the next day.  -Sun screen and avoidance of direct sun light recommended. Caution with dehydration, if working outdoors be sure to drink enough fluids.  - Some medications are not safe as we age, increases the risk of side effects and can potentially interact with other medication you are also taken;  including some of over the counter medications. Be sure to let me know when you start a new medication even if it is a dietary/vitamin supplement.   -Healthy diet low in red meet/animal fat and sugar + regular physical activity is recommended.       Please be sure medication list is accurate. If a new problem present, please set up appointment sooner than planned today.

## 2018-10-23 NOTE — Assessment & Plan Note (Signed)
BP well controlled. No changes in current management. Continue low salt diet. Eye exam is current. Follow-up in 6 months, before if needed.
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
## 2018-10-23 NOTE — Assessment & Plan Note (Signed)
Continue low-fat diet. Further recommendation will be given according to lipid panel results as well as 10 years CVD score.

## 2018-10-23 NOTE — Assessment & Plan Note (Signed)
It has been mild. Further recommendation will be given according to CBC results.

## 2018-10-23 NOTE — Assessment & Plan Note (Signed)
Educated about diagnosis. She denied receiving prescription analgesics. OTC Tylenol 500 mg 3-4 times per day may help. Recommend avoiding activities that aggravate pain.

## 2018-10-24 ENCOUNTER — Other Ambulatory Visit: Payer: Self-pay | Admitting: *Deleted

## 2018-10-24 DIAGNOSIS — D509 Iron deficiency anemia, unspecified: Secondary | ICD-10-CM

## 2018-10-25 ENCOUNTER — Encounter: Payer: Self-pay | Admitting: Nurse Practitioner

## 2018-10-26 ENCOUNTER — Other Ambulatory Visit: Payer: Self-pay

## 2018-10-26 ENCOUNTER — Emergency Department (HOSPITAL_COMMUNITY)
Admission: EM | Admit: 2018-10-26 | Discharge: 2018-10-26 | Disposition: A | Discharge status: Home / Self Care | Payer: Medicare Other | Attending: Emergency Medicine | Admitting: Emergency Medicine

## 2018-10-26 ENCOUNTER — Ambulatory Visit (HOSPITAL_COMMUNITY)
Admission: EM | Admit: 2018-10-26 | Discharge: 2018-10-26 | Disposition: A | Discharge status: Home / Self Care | Payer: Medicare Other | Source: Home / Self Care

## 2018-10-26 ENCOUNTER — Encounter (HOSPITAL_COMMUNITY): Payer: Self-pay

## 2018-10-26 ENCOUNTER — Emergency Department (HOSPITAL_COMMUNITY): Payer: Medicare Other

## 2018-10-26 DIAGNOSIS — R000 Tachycardia, unspecified: Secondary | ICD-10-CM | POA: Diagnosis not present

## 2018-10-26 DIAGNOSIS — Z87891 Personal history of nicotine dependence: Secondary | ICD-10-CM | POA: Diagnosis not present

## 2018-10-26 DIAGNOSIS — R200 Anesthesia of skin: Secondary | ICD-10-CM | POA: Diagnosis present

## 2018-10-26 DIAGNOSIS — Z79899 Other long term (current) drug therapy: Secondary | ICD-10-CM | POA: Diagnosis not present

## 2018-10-26 DIAGNOSIS — J45909 Unspecified asthma, uncomplicated: Secondary | ICD-10-CM | POA: Diagnosis not present

## 2018-10-26 DIAGNOSIS — I10 Essential (primary) hypertension: Secondary | ICD-10-CM | POA: Insufficient documentation

## 2018-10-26 DIAGNOSIS — D170 Benign lipomatous neoplasm of skin and subcutaneous tissue of head, face and neck: Secondary | ICD-10-CM | POA: Diagnosis not present

## 2018-10-26 DIAGNOSIS — R202 Paresthesia of skin: Secondary | ICD-10-CM | POA: Insufficient documentation

## 2018-10-26 LAB — CBC
HCT: 31.1 % — ABNORMAL LOW (ref 36.0–46.0)
Hemoglobin: 7.9 g/dL — ABNORMAL LOW (ref 12.0–15.0)
MCH: 16.6 pg — ABNORMAL LOW (ref 26.0–34.0)
MCHC: 25.4 g/dL — ABNORMAL LOW (ref 30.0–36.0)
MCV: 65.5 fL — ABNORMAL LOW (ref 80.0–100.0)
NRBC: 0 % (ref 0.0–0.2)
Platelets: 344 10*3/uL (ref 150–400)
RBC: 4.75 MIL/uL (ref 3.87–5.11)
RDW: 18.5 % — ABNORMAL HIGH (ref 11.5–15.5)
WBC: 8.1 10*3/uL (ref 4.0–10.5)

## 2018-10-26 LAB — DIFFERENTIAL
Abs Immature Granulocytes: 0.05 10*3/uL (ref 0.00–0.07)
Basophils Absolute: 0 10*3/uL (ref 0.0–0.1)
Basophils Relative: 0 %
EOS ABS: 0 10*3/uL (ref 0.0–0.5)
EOS PCT: 0 %
Immature Granulocytes: 1 %
Lymphocytes Relative: 9 %
Lymphs Abs: 0.7 10*3/uL (ref 0.7–4.0)
Monocytes Absolute: 0.6 10*3/uL (ref 0.1–1.0)
Monocytes Relative: 8 %
Neutro Abs: 6.7 10*3/uL (ref 1.7–7.7)
Neutrophils Relative %: 82 %

## 2018-10-26 LAB — COMPREHENSIVE METABOLIC PANEL
ALT: 14 U/L (ref 0–44)
AST: 21 U/L (ref 15–41)
Albumin: 3.9 g/dL (ref 3.5–5.0)
Alkaline Phosphatase: 68 U/L (ref 38–126)
Anion gap: 11 (ref 5–15)
BUN: 11 mg/dL (ref 8–23)
CO2: 22 mmol/L (ref 22–32)
Calcium: 9.2 mg/dL (ref 8.9–10.3)
Chloride: 107 mmol/L (ref 98–111)
Creatinine, Ser: 1.02 mg/dL — ABNORMAL HIGH (ref 0.44–1.00)
GFR calc non Af Amer: 58 mL/min — ABNORMAL LOW (ref >60)
Glucose, Bld: 117 mg/dL — ABNORMAL HIGH (ref 70–99)
Potassium: 3.3 mmol/L — ABNORMAL LOW (ref 3.5–5.1)
Sodium: 140 mmol/L (ref 135–145)
Total Bilirubin: 0.5 mg/dL (ref 0.3–1.2)
Total Protein: 7.2 g/dL (ref 6.5–8.1)

## 2018-10-26 LAB — PROTIME-INR
INR: 1.04
Prothrombin Time: 13.5 seconds (ref 11.4–15.2)

## 2018-10-26 LAB — CBG MONITORING, ED: Glucose-Capillary: 103 mg/dL — ABNORMAL HIGH (ref 70–99)

## 2018-10-26 LAB — APTT: aPTT: 32 seconds (ref 24–36)

## 2018-10-26 MED ORDER — SODIUM CHLORIDE 0.9% FLUSH: 3.0000 mL | Freq: Once | INTRAVENOUS | Status: DC

## 2018-10-26 NOTE — ED Notes (Signed)
Patient states she is having facial numbness that started last night.  Advised by Loura Halt NP to encourage patient to seek care at ED.  Patient agreeable and daughter is taking her to ED now.

## 2018-10-26 NOTE — Discharge Instructions (Addendum)
The testing today is normal.  If you continue to have numbness of your face, ask your primary care doctor for evaluation and possible referral to a neurologist.  Return here, if needed, for problems.
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
## 2018-10-26 NOTE — ED Triage Notes (Signed)
Pt reports she has been having intermittent numbness to the right side of her face since last night. Denies at present. No headache. Pt aoX4.

## 2018-10-30 ENCOUNTER — Ambulatory Visit (INDEPENDENT_AMBULATORY_CARE_PROVIDER_SITE_OTHER): Payer: Medicare Other | Admitting: Family Medicine

## 2018-10-30 ENCOUNTER — Encounter: Payer: Self-pay | Admitting: Family Medicine

## 2018-10-30 VITALS — BP 126/70 | HR 80 | Temp 97.9°F | Resp 16 | Ht 68.25 in | Wt 300.0 lb

## 2018-10-30 DIAGNOSIS — F419 Anxiety disorder, unspecified: Secondary | ICD-10-CM

## 2018-10-30 DIAGNOSIS — R209 Unspecified disturbances of skin sensation: Secondary | ICD-10-CM

## 2018-10-30 DIAGNOSIS — E876 Hypokalemia: Secondary | ICD-10-CM | POA: Diagnosis not present

## 2018-10-30 DIAGNOSIS — D509 Iron deficiency anemia, unspecified: Secondary | ICD-10-CM | POA: Diagnosis not present

## 2018-10-30 DIAGNOSIS — R202 Paresthesia of skin: Secondary | ICD-10-CM

## 2018-10-30 NOTE — Assessment & Plan Note (Signed)
For now she is not interested in pharmacologic treatment. He will let me know if she decides to start sertraline low-dose. Instructed about warning signs.

## 2018-10-30 NOTE — Patient Instructions (Addendum)
A few things to remember from today's visit:   Facial paresthesia  Hypokalemia  Iron deficiency anemia, unspecified iron deficiency anemia type  Anxiety disorder, unspecified type  Keep lab appointment next week. Keep appointment with gastroenterologist.  Continue iron supplementation.  Potassium rich diet, bananas,oranges among some.
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
## 2018-10-30 NOTE — Assessment & Plan Note (Signed)
Continue ferrous sulfate 325 mg twice daily. She already has an appointment for lab work next week. She has an appointment with gastroenterologist later this week. Clearly instructed about warning signs.

## 2018-10-31 ENCOUNTER — Telehealth: Payer: Self-pay | Admitting: Family Medicine

## 2018-10-31 NOTE — Telephone Encounter (Signed)
Copied from [place] [id number]. Topic: General - Inquiry >> Oct 31, 2018 11:32 AM Sheila Hartman wrote: Reason for CRM: Pt states 2 years ago (letter from 10/12/2016) she had a note from PCP advising that antibiotics were not needed for routine dental cleaning but would need to eval for extensive dental procedures. Pt asking if this would still stand and if she can get another letter. She is having to change dentist and has an appt this Thursday afternoon 11/02/2018 for routine cleaning. Pt has had a hip replacement in the past (6 years ago). Please advise.

## 2018-10-31 NOTE — Telephone Encounter (Signed)
Message sent to Dr. Jordan for review. Please advise 

## 2018-11-01 ENCOUNTER — Encounter: Payer: Self-pay | Admitting: *Deleted

## 2018-11-01 NOTE — Telephone Encounter (Signed)
I agree with letter issued by Dr. Lovina Reach on 10/12/2016.  She has no history of valvular/congenital heart disease nor Hx of endocarditis, so I do not think she needs antibiotic prophylaxis for routine dental cleaning. Letter can be sent to her dentist as requested.  Thanks, BJ

## 2018-11-02 ENCOUNTER — Encounter: Payer: Self-pay | Admitting: Family Medicine

## 2018-11-02 ENCOUNTER — Telehealth: Payer: Self-pay

## 2018-11-02 NOTE — Telephone Encounter (Signed)
Copied from [place] [phone]. Topic: Referral - Request for Referral >> Nov 01, 2018  5:48 PM Windy Kalata wrote: Has patient seen PCP for this complaint? Yes *If NO, is insurance requiring patient see PCP for this issue before PCP can refer them? Referral for which specialty: Neurologist Preferred provider/office: Does not have anyone in mind Reason for referral: Face numbness, to lips had many ER visits

## 2018-11-03 ENCOUNTER — Ambulatory Visit: Payer: Medicare Other | Admitting: Nurse Practitioner

## 2018-11-03 ENCOUNTER — Encounter: Payer: Self-pay | Admitting: Nurse Practitioner

## 2018-11-03 ENCOUNTER — Other Ambulatory Visit: Payer: Self-pay | Admitting: Family Medicine

## 2018-11-03 VITALS — BP 116/70 | HR 96 | Ht 67.0 in | Wt 297.4 lb

## 2018-11-03 DIAGNOSIS — R200 Anesthesia of skin: Secondary | ICD-10-CM

## 2018-11-03 DIAGNOSIS — D509 Iron deficiency anemia, unspecified: Secondary | ICD-10-CM

## 2018-11-03 MED ORDER — NA SULFATE-K SULFATE-MG SULF 17.5-3.13-1.6 GM/177ML PO SOLN: ORAL | 0 refills | Status: DC

## 2018-11-03 NOTE — Patient Instructions (Signed)
If you are age 66 or older, your body mass index should be between 23-30. Your Body mass index is 46.58 kg/m. If this is out of the aforementioned range listed, please consider follow up with your Primary Care Provider.  If you are age 27 or younger, your body mass index should be between 19-25. Your Body mass index is 46.58 kg/m. If this is out of the aformentioned range listed, please consider follow up with your Primary Care Provider.   You have been scheduled for an endoscopy and colonoscopy. Please follow the written instructions given to you at your visit today. Please pick up your prep supplies at the pharmacy within the next 1-3 days. If you use inhalers (even only as needed), please bring them with you on the day of your procedure. Your physician has requested that you go to www.startemmi.com and enter the access code given to you at your visit today. This web site gives a general overview about your procedure. However, you should still follow specific instructions given to you by our office regarding your preparation for the procedure.  We have sent the following medications to your pharmacy for you to pick up at your convenience: [place].  Thank you for choosing me and [facility].   Tye Savoy, NP

## 2018-11-03 NOTE — Telephone Encounter (Signed)
Neurology referral placed. Thanks, BJ

## 2018-11-03 NOTE — Telephone Encounter (Signed)
Patient informed on 10/31/2018 and verbalized understanding. Patient advised to call office if new letter needed. Nothing further needed at this time.

## 2018-11-03 NOTE — Telephone Encounter (Signed)
Is it okay to place referral? 
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
## 2018-11-06 ENCOUNTER — Encounter: Payer: Self-pay | Admitting: Neurology

## 2018-11-06 ENCOUNTER — Telehealth: Payer: Self-pay | Admitting: Gastroenterology

## 2018-11-06 ENCOUNTER — Other Ambulatory Visit: Payer: Medicare Other

## 2018-11-06 NOTE — Telephone Encounter (Signed)
Spoke to patient and let them know they can pick up a prep at the front desk. She expressed her gratitude. Prep was $100.

## 2018-11-06 NOTE — Telephone Encounter (Signed)
Pt stated that Suprep is too expensive and she cannot afford it.

## 2018-11-07 ENCOUNTER — Other Ambulatory Visit: Payer: Self-pay | Admitting: Family Medicine

## 2018-11-07 ENCOUNTER — Other Ambulatory Visit (INDEPENDENT_AMBULATORY_CARE_PROVIDER_SITE_OTHER): Payer: Medicare Other

## 2018-11-07 DIAGNOSIS — E876 Hypokalemia: Secondary | ICD-10-CM

## 2018-11-07 DIAGNOSIS — D509 Iron deficiency anemia, unspecified: Secondary | ICD-10-CM | POA: Diagnosis not present

## 2018-11-07 LAB — POTASSIUM: Potassium: 3.5 mEq/L (ref 3.5–5.1)

## 2018-11-07 LAB — CBC WITH DIFFERENTIAL/PLATELET
Basophils Absolute: 0 10*3/uL (ref 0.0–0.1)
Basophils Relative: 0.4 % (ref 0.0–3.0)
Eosinophils Absolute: 0.1 10*3/uL (ref 0.0–0.7)
Eosinophils Relative: 1 % (ref 0.0–5.0)
HCT: 30.4 % — ABNORMAL LOW (ref 36.0–46.0)
HEMOGLOBIN: 9.2 g/dL — AB (ref 12.0–15.0)
Lymphocytes Relative: 15.5 % (ref 12.0–46.0)
Lymphs Abs: 0.9 10*3/uL (ref 0.7–4.0)
MCHC: 30.2 g/dL (ref 30.0–36.0)
MCV: 64.3 fl — ABNORMAL LOW (ref 78.0–100.0)
MONO ABS: 0.5 10*3/uL (ref 0.1–1.0)
Monocytes Relative: 8.3 % (ref 3.0–12.0)
Neutro Abs: 4.3 10*3/uL (ref 1.4–7.7)
Neutrophils Relative %: 74.8 % (ref 43.0–77.0)
Platelets: 267 10*3/uL (ref 150.0–400.0)
RBC: 4.73 Mil/uL (ref 3.87–5.11)
RDW: 27.4 % — ABNORMAL HIGH (ref 11.5–15.5)
WBC: 5.7 10*3/uL (ref 4.0–10.5)

## 2018-11-08 ENCOUNTER — Encounter: Payer: Self-pay | Admitting: Family Medicine

## 2018-11-13 NOTE — Progress Notes (Signed)
Reviewed and agree with documentation and assessment and plan. K. Veena Tilia Faso , MD   

## 2018-11-14 ENCOUNTER — Encounter: Payer: Self-pay | Admitting: Gastroenterology

## 2018-11-21 ENCOUNTER — Telehealth: Payer: Self-pay

## 2018-11-21 NOTE — Telephone Encounter (Signed)
Copied from [place] [id number]. Topic: General - Inquiry >> Nov 20, 2018  5:09 PM Jeri Cos wrote: Reason for CRM: Pt called asking if Dr. Martinique can prescribe medication to help calm her down before her procedures next week. Pt is having a colonoscopy and endoscopy next Tuesday. Pt has not been prescribed any medication for this in the past.

## 2018-11-21 NOTE — Telephone Encounter (Signed)
Message sent to Dr. Jordan for review and approval. 

## 2018-11-24 ENCOUNTER — Other Ambulatory Visit: Payer: Self-pay | Admitting: Family Medicine

## 2018-11-24 MED ORDER — SERTRALINE HCL 25 MG PO TABS: 25.0000 mg | ORAL_TABLET | Freq: Every day | ORAL | 1 refills | Status: DC

## 2018-11-24 NOTE — Telephone Encounter (Signed)
Spoke with patient and she stated that medication was discussed at ov, but wasn't prescribed. Patient would like to try the Zoloft 25 mg.

## 2018-11-24 NOTE — Telephone Encounter (Signed)
I am sorry, she is correct. Sertraline 25 mg sent to her pharmacy. Please arrange 6 weeks follow-up appointment, if not yet scheduled.  Thanks, BJ

## 2018-11-24 NOTE — Telephone Encounter (Signed)
Earlier this month she was started on sertraline 25 mg. Usually for colonoscopy and/or endoscopy sedation is provided [if I am not mistaken]. If she is feeling anxiety getting worse, she can increase sertraline from 25 mg to 50 mg.  Thanks, BJ

## 2018-11-27 NOTE — Telephone Encounter (Signed)
Left detailed message informing patient that Rx was sent to the pharmacy and to call the office to schedule follow-up for 6 weeks.

## 2018-11-28 ENCOUNTER — Encounter: Payer: Self-pay | Admitting: Gastroenterology

## 2018-11-28 ENCOUNTER — Other Ambulatory Visit: Payer: Self-pay

## 2018-11-28 ENCOUNTER — Ambulatory Visit (AMBULATORY_SURGERY_CENTER): Payer: Medicare Other | Admitting: Gastroenterology

## 2018-11-28 VITALS — BP 117/66 | HR 67 | Temp 98.4°F | Resp 16 | Ht 67.0 in | Wt 297.0 lb

## 2018-11-28 DIAGNOSIS — K449 Diaphragmatic hernia without obstruction or gangrene: Secondary | ICD-10-CM | POA: Diagnosis not present

## 2018-11-28 DIAGNOSIS — D509 Iron deficiency anemia, unspecified: Secondary | ICD-10-CM | POA: Diagnosis not present

## 2018-11-28 DIAGNOSIS — D125 Benign neoplasm of sigmoid colon: Secondary | ICD-10-CM | POA: Diagnosis not present

## 2018-11-28 DIAGNOSIS — D124 Benign neoplasm of descending colon: Secondary | ICD-10-CM

## 2018-11-28 DIAGNOSIS — D122 Benign neoplasm of ascending colon: Secondary | ICD-10-CM

## 2018-11-28 DIAGNOSIS — D120 Benign neoplasm of cecum: Secondary | ICD-10-CM | POA: Diagnosis not present

## 2018-11-28 DIAGNOSIS — K635 Polyp of colon: Secondary | ICD-10-CM

## 2018-11-28 DIAGNOSIS — K3189 Other diseases of stomach and duodenum: Secondary | ICD-10-CM | POA: Diagnosis not present

## 2018-11-28 MED ORDER — SODIUM CHLORIDE 0.9 % IV SOLN: 500.0000 mL | Freq: Once | INTRAVENOUS | Status: DC

## 2018-11-28 NOTE — Progress Notes (Signed)
PT taken to PACU. Monitors in place. VSS. Report given to RN. 

## 2018-11-28 NOTE — Op Note (Signed)
[facility] Patient Name: Sheila Hartman Procedure Date: 11/28/2018 2:05 PM MRN: [id number] Endoscopist: Thornton Park MD, MD Age: 68 Referring MD:  Date of Birth: May 30, 1951 Gender: Female Account #: [id number] Procedure:                Upper GI endoscopy Indications:              Unexplained iron deficiency anemia Medicines:                See the Anesthesia note for documentation of the                            administered medications Procedure:                Pre-Anesthesia Assessment:                           - Prior to the procedure, a History and Physical                            was performed, and patient medications and                            allergies were reviewed. The patient's tolerance of                            previous anesthesia was also reviewed. The risks                            and benefits of the procedure and the sedation                            options and risks were discussed with the patient.                            All questions were answered, and informed consent                            was obtained. Prior Anticoagulants: The patient has                            taken no previous anticoagulant or antiplatelet                            agents. ASA Grade Assessment: III - A patient with                            severe systemic disease. After reviewing the risks                            and benefits, the patient was deemed in                            satisfactory condition to undergo the procedure.  After obtaining informed consent, the endoscope was                            passed under direct vision. Throughout the                            procedure, the patient's blood pressure, pulse, and                            oxygen saturations were monitored continuously. The                            Endoscope was introduced through the mouth, and                            advanced to  the third part of duodenum. The upper                            GI endoscopy was accomplished with ease. The                            patient tolerated the procedure well. Scope In: Scope Out: Findings:                 The esophagus was normal.                           A small hiatal hernia was present.                           The examined duodenum was normal. Biopsies for                            histology were taken with a cold forceps for                            evaluation of celiac disease.                           The exam was otherwise without abnormality. Complications:            No immediate complications. Estimated blood loss:                            Minimal. Estimated Blood Loss:     Estimated blood loss was minimal. Impression:               - Normal esophagus.                           - Small hiatal hernia.                           - Normal examined duodenum. Biopsied.                           - The examination was otherwise  normal.                           - No source for microcytic anemia identified on                            this study. Biopsies obtained for celiac disease. Recommendation:           - Await pathology results.                           - Patient has a contact number available for                            emergencies. The signs and symptoms of potential                            delayed complications were discussed with the                            patient. Return to normal activities tomorrow.                            Written discharge instructions were provided to the                            patient.                           - Resume regular diet.                           - Continue present medications.                           - No repeat upper endoscopy planned at this time. Thornton Park MD, MD 11/28/2018 2:51:28 PM This report has been signed electronically.

## 2018-11-28 NOTE — Op Note (Signed)
[facility] Patient Name: Sheila Hartman Procedure Date: 11/28/2018 2:05 PM MRN: [id number] Endoscopist: Thornton Park MD, MD Age: 68 Referring MD:  Date of Birth: [birth year] Gender: Female Account #: [id number] Procedure:                Colonoscopy Indications:              Unexplained iron deficiency anemia Medicines:                See the Anesthesia note for documentation of the                            administered medications Procedure:                Pre-Anesthesia Assessment:                           - Prior to the procedure, a History and Physical                            was performed, and patient medications and                            allergies were reviewed. The patient's tolerance of                            previous anesthesia was also reviewed. The risks                            and benefits of the procedure and the sedation                            options and risks were discussed with the patient.                            All questions were answered, and informed consent                            was obtained. Prior Anticoagulants: The patient has                            taken no previous anticoagulant or antiplatelet                            agents. ASA Grade Assessment: III - A patient with                            severe systemic disease. After reviewing the risks                            and benefits, the patient was deemed in                            satisfactory condition to undergo the procedure.  After obtaining informed consent, the colonoscope                            was passed under direct vision. Throughout the                            procedure, the patient's blood pressure, pulse, and                            oxygen saturations were monitored continuously. The                            Colonoscope was introduced through the anus and                            advanced to the the  terminal ileum, with                            identification of the appendiceal orifice and IC                            valve. The colonoscopy was performed with moderate                            difficulty due to a redundant colon, significant                            looping and a tortuous colon. Successful completion                            of the procedure was aided by applying abdominal                            pressure. The patient tolerated the procedure well.                            The quality of the bowel preparation was excellent.                            The terminal ileum, ileocecal valve, appendiceal                            orifice, and rectum were photographed. Scope In: 2:19:16 PM Scope Out: 2:42:49 PM Scope Withdrawal Time: 0 hours 16 minutes 31 seconds  Total Procedure Duration: 0 hours 23 minutes 33 seconds  Findings:                 The perianal and digital rectal examinations were                            normal.                           Multiple small and large-mouthed diverticula were  found in the sigmoid colon and descending colon.                           A 2 mm polyp was found in the cecum. The polyp was                            sessile. The polyp was removed with a cold biopsy                            forceps. Resection and retrieval were complete.                           Two sessile polyps were found in the ascending                            colon. The polyps were 2 to 5 mm in size. These                            polyps were removed with a cold snare. Resection                            and retrieval were complete. Estimated blood loss                            was minimal. Estimated blood loss was minimal.                           Four sessile polyps were found in the descending                            colon. The polyps were 2 to 6 mm in size. These                            polyps were  removed with a cold snare. Resection                            and retrieval were complete. Estimated blood loss                            was minimal.                           Three sessile polyps were found in the sigmoid                            colon. The polyps were 3 to 7 mm in size. These                            polyps were removed with a cold snare. Resection                            and retrieval were complete. Estimated  blood loss                            was minimal.                           The exam was otherwise without abnormality on                            direct and retroflexion views. Complications:            No immediate complications. Estimated blood loss:                            Minimal. Estimated Blood Loss:     Estimated blood loss was minimal. Impression:               - Diverticulosis in the sigmoid colon and in the                            descending colon.                           - One 2 mm polyp in the cecum, removed with a cold                            biopsy forceps. Resected and retrieved.                           - Two 2 to 5 mm polyps in the ascending colon,                            removed with a cold snare. Resected and retrieved.                           - Four 2 to 6 mm polyps in the descending colon,                            removed with a cold snare. Resected and retrieved.                           - Three 3 to 7 mm polyps in the sigmoid colon,                            removed with a cold snare. Resected and retrieved.                           - The examination was otherwise normal on direct                            and retroflexion views. Recommendation:           - Patient has a contact number available for  emergencies. The signs and symptoms of potential                            delayed complications were discussed with the                            patient. Return to normal  activities tomorrow.                            Written discharge instructions were provided to the                            patient.                           - Resume regular diet. High fiber diet recommended.                           - Await pathology results.                           - Repeat colonoscopy date to be determined after                            pending pathology results are reviewed for                            surveillance based on pathology results. Thornton Park MD, MD 11/28/2018 2:59:46 PM This report has been signed electronically.

## 2018-11-28 NOTE — Progress Notes (Signed)
Called to room to assist during endoscopic procedure.  Patient ID and intended procedure confirmed with present staff. Received instructions for my participation in the procedure from the performing physician.  

## 2018-11-28 NOTE — Patient Instructions (Signed)
Upper Endoscopy - await biopsy results  Colonoscopy- Information on polyps given to you today,Information on diverticulosis & high fiber diet given to you today                           Await pathology results on polyps removed     YOU HAD AN ENDOSCOPIC PROCEDURE TODAY AT [facility]:   Refer to the procedure report that was given to you for any specific questions about what was found during the examination.  If the procedure report does not answer your questions, please call your gastroenterologist to clarify.  If you requested that your care partner not be given the details of your procedure findings, then the procedure report has been included in a sealed envelope for you to review at your convenience later.  YOU SHOULD EXPECT: Some feelings of bloating in the abdomen. Passage of more gas than usual.  Walking can help get rid of the air that was put into your GI tract during the procedure and reduce the bloating. If you had a lower endoscopy (such as a colonoscopy or flexible sigmoidoscopy) you may notice spotting of blood in your stool or on the toilet paper. If you underwent a bowel prep for your procedure, you may not have a normal bowel movement for a few days.  Please Note:  You might notice some irritation and congestion in your nose or some drainage.  This is from the oxygen used during your procedure.  There is no need for concern and it should clear up in a day or so.  SYMPTOMS TO REPORT IMMEDIATELY:   Following lower endoscopy (colonoscopy or flexible sigmoidoscopy):  Excessive amounts of blood in the stool  Significant tenderness or worsening of abdominal pains  Swelling of the abdomen that is new, acute  Fever of 100F or higher   Following upper endoscopy (EGD)  Vomiting of blood or coffee ground material  New chest pain or pain under the shoulder blades  Painful or persistently difficult swallowing  New shortness of breath  Fever of 100F or  higher  Black, tarry-looking stools  For urgent or emergent issues, a gastroenterologist can be reached at any hour by calling [phone].   DIET:  We do recommend a small meal at first, but then you may proceed to your regular diet.  Drink plenty of fluids but you should avoid alcoholic beverages for 24 hours.  ACTIVITY:  You should plan to take it easy for the rest of today and you should NOT DRIVE or use heavy machinery until tomorrow (because of the sedation medicines used during the test).    FOLLOW UP: Our staff will call the number listed on your records the next business day following your procedure to check on you and address any questions or concerns that you may have regarding the information given to you following your procedure. If we do not reach you, we will leave a message.  However, if you are feeling well and you are not experiencing any problems, there is no need to return our call.  We will assume that you have returned to your regular daily activities without incident.  If any biopsies were taken you will be contacted by phone or by letter within the next 1-3 weeks.  Please call us at [phone] if you have not heard about the biopsies in 3 weeks.    SIGNATURES/CONFIDENTIALITY: You and/or your care partner have  signed paperwork which will be entered into your electronic medical record.  These signatures attest to the fact that that the information above on your After Visit Summary has been reviewed and is understood.  Full responsibility of the confidentiality of this discharge information lies with you and/or your care-partner.

## 2018-11-29 ENCOUNTER — Telehealth: Payer: Self-pay

## 2018-11-29 NOTE — Telephone Encounter (Signed)
  Follow up Call-  Call back number 11/28/2018  Post procedure Call Back phone  # [phone]  Permission to leave phone message Yes  Some recent data might be hidden     Patient questions:  Do you have a fever, pain , or abdominal swelling? No. Pain Score  0 *  Have you tolerated food without any problems? Yes.    Have you been able to return to your normal activities? Yes.    Do you have any questions about your discharge instructions: Diet   No. Medications  No. Follow up visit  No.  Do you have questions or concerns about your Care? No.  Actions: * If pain score is 4 or above: No action needed, pain <4.

## 2018-11-29 NOTE — Telephone Encounter (Signed)
No answer, left message to call back later today, B.Dawson Albers RN. 

## 2018-12-04 ENCOUNTER — Encounter: Payer: Self-pay | Admitting: Gastroenterology

## 2018-12-04 ENCOUNTER — Other Ambulatory Visit: Payer: Self-pay

## 2018-12-04 DIAGNOSIS — K635 Polyp of colon: Secondary | ICD-10-CM

## 2018-12-18 ENCOUNTER — Telehealth: Payer: Self-pay | Admitting: Genetic Counselor

## 2018-12-18 ENCOUNTER — Encounter: Payer: Self-pay | Admitting: Genetic Counselor

## 2018-12-18 NOTE — Telephone Encounter (Signed)
A genetic counseling appt has been scheduled for the pt to see Roma Kayser on 6/17 at 10am. Letter mailed.

## 2018-12-22 ENCOUNTER — Other Ambulatory Visit: Payer: Self-pay | Admitting: Family Medicine

## 2018-12-22 MED ORDER — SERTRALINE HCL 25 MG PO TABS: 25.0000 mg | ORAL_TABLET | Freq: Every day | ORAL | 1 refills | Status: DC

## 2018-12-27 ENCOUNTER — Encounter: Payer: Self-pay | Admitting: Family Medicine

## 2018-12-27 ENCOUNTER — Ambulatory Visit (INDEPENDENT_AMBULATORY_CARE_PROVIDER_SITE_OTHER): Payer: Medicare Other | Admitting: Family Medicine

## 2018-12-27 ENCOUNTER — Other Ambulatory Visit: Payer: Self-pay

## 2018-12-27 VITALS — Resp 16

## 2018-12-27 DIAGNOSIS — K219 Gastro-esophageal reflux disease without esophagitis: Secondary | ICD-10-CM | POA: Diagnosis not present

## 2018-12-27 DIAGNOSIS — F419 Anxiety disorder, unspecified: Secondary | ICD-10-CM | POA: Diagnosis not present

## 2018-12-27 MED ORDER — OMEPRAZOLE 40 MG PO CPDR: 40.0000 mg | DELAYED_RELEASE_CAPSULE | Freq: Every day | ORAL | 2 refills | Status: DC

## 2018-12-27 MED ORDER — SERTRALINE HCL 50 MG PO TABS: 50.0000 mg | ORAL_TABLET | Freq: Every day | ORAL | 1 refills | Status: DC

## 2018-12-27 NOTE — Assessment & Plan Note (Addendum)
Problem has improved after starting Sertraline but still having episodes of anxiety. For now no changes in sertraline 50 mg, she increased dose 4 days ago. Instructed about warning signs. Follow-up in 3 months, before if needed.
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
## 2018-12-27 NOTE — Assessment & Plan Note (Addendum)
After discussion of some pharmacologic treatment options, she agrees with trying omeprazole 40 mg daily.  We will plan on decreasing omeprazole dose to 20 mg once symptoms are well controlled. Also recommend GERD precautions. Instructed about warning signs. Follow-up in 3 months.
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
## 2019-01-03 ENCOUNTER — Telehealth (INDEPENDENT_AMBULATORY_CARE_PROVIDER_SITE_OTHER): Payer: Medicare Other | Admitting: Neurology

## 2019-01-03 ENCOUNTER — Other Ambulatory Visit: Payer: Self-pay | Admitting: *Deleted

## 2019-01-03 ENCOUNTER — Telehealth: Payer: Self-pay | Admitting: Neurology

## 2019-01-03 ENCOUNTER — Other Ambulatory Visit: Payer: Self-pay

## 2019-01-03 ENCOUNTER — Encounter: Payer: Self-pay | Admitting: *Deleted

## 2019-01-03 ENCOUNTER — Telehealth: Payer: Self-pay | Admitting: *Deleted

## 2019-01-03 DIAGNOSIS — R202 Paresthesia of skin: Secondary | ICD-10-CM

## 2019-01-03 DIAGNOSIS — G459 Transient cerebral ischemic attack, unspecified: Secondary | ICD-10-CM

## 2019-01-03 DIAGNOSIS — R200 Anesthesia of skin: Secondary | ICD-10-CM

## 2019-01-03 MED ORDER — DIAZEPAM 5 MG PO TABS: ORAL_TABLET | ORAL | 0 refills | Status: DC

## 2019-01-03 MED ORDER — ASPIRIN 81 MG PO TABS: 81.0000 mg | ORAL_TABLET | Freq: Every day | ORAL | 5 refills | Status: DC

## 2019-01-03 NOTE — Telephone Encounter (Signed)
No PA needed

## 2019-01-03 NOTE — Telephone Encounter (Signed)
Valium was sent to her pharmacy to take prior to MRI.

## 2019-01-03 NOTE — Telephone Encounter (Signed)
Patient needs to have something to help relax her during the MRI

## 2019-01-03 NOTE — Telephone Encounter (Signed)
Did you send something?

## 2019-01-03 NOTE — Addendum Note (Signed)
Addended by: Alda Berthold on: 01/03/2019 11:26 AM   Modules accepted: Orders

## 2019-01-03 NOTE — Progress Notes (Signed)
She is scheduled for 01/10/2019.

## 2019-01-08 ENCOUNTER — Ambulatory Visit: Payer: Medicare Other | Admitting: Neurology

## 2019-01-09 ENCOUNTER — Other Ambulatory Visit: Payer: Self-pay

## 2019-01-09 ENCOUNTER — Ambulatory Visit: Payer: Medicare Other | Admitting: Family Medicine

## 2019-01-09 NOTE — Telephone Encounter (Signed)
lab called critical result hemoglobin 8.0 dr Martinique notified

## 2019-01-10 ENCOUNTER — Other Ambulatory Visit: Payer: Self-pay

## 2019-01-10 ENCOUNTER — Ambulatory Visit
Admission: RE | Admit: 2019-01-10 | Discharge: 2019-01-10 | Disposition: A | Discharge status: Home / Self Care | Payer: Medicare Other | Source: Ambulatory Visit | Attending: Neurology | Admitting: Neurology

## 2019-01-10 DIAGNOSIS — R202 Paresthesia of skin: Secondary | ICD-10-CM

## 2019-01-10 DIAGNOSIS — R200 Anesthesia of skin: Secondary | ICD-10-CM

## 2019-01-10 DIAGNOSIS — G459 Transient cerebral ischemic attack, unspecified: Secondary | ICD-10-CM

## 2019-01-10 MED ORDER — GADOBENATE DIMEGLUMINE 529 MG/ML IV SOLN
20.0000 mL | Freq: Once | INTRAVENOUS | Status: AC | PRN
  Administered 2019-01-10: 20 mL via INTRAVENOUS

## 2019-01-11 ENCOUNTER — Other Ambulatory Visit (INDEPENDENT_AMBULATORY_CARE_PROVIDER_SITE_OTHER): Payer: Medicare Other

## 2019-01-11 ENCOUNTER — Other Ambulatory Visit: Payer: Self-pay | Admitting: *Deleted

## 2019-01-11 ENCOUNTER — Telehealth: Payer: Self-pay | Admitting: *Deleted

## 2019-01-11 ENCOUNTER — Telehealth: Payer: Self-pay | Admitting: Neurology

## 2019-01-11 DIAGNOSIS — R202 Paresthesia of skin: Secondary | ICD-10-CM | POA: Diagnosis not present

## 2019-01-11 DIAGNOSIS — G459 Transient cerebral ischemic attack, unspecified: Secondary | ICD-10-CM

## 2019-01-11 DIAGNOSIS — R200 Anesthesia of skin: Secondary | ICD-10-CM | POA: Diagnosis not present

## 2019-01-11 LAB — VITAMIN B12: Vitamin B-12: 298 pg/mL (ref 211–911)

## 2019-01-11 LAB — FOLATE: Folate: 13.6 ng/mL (ref >5.9)

## 2019-01-11 LAB — SEDIMENTATION RATE: Sed Rate: 30 mm/h (ref 0–30)

## 2019-01-11 NOTE — Telephone Encounter (Signed)
Patient said she was calling you back about some lab work.. Please call her back. Thanks!

## 2019-01-11 NOTE — Telephone Encounter (Signed)
Left message giving patient her MRI results and requested for her to call me about doing her lab work.

## 2019-01-11 NOTE — Telephone Encounter (Signed)
-----  Message from Alda Berthold, DO sent at 01/10/2019  3:09 PM EDT ----- Please inform pt that her MRI brain looks good - no signs of stroke, bleed or mass causing facial tingling.  Next step is to check a few labs - ESR, vitamin B12, vitamin B1, folate, copper, SPEP with IFE.   Thanks.

## 2019-01-11 NOTE — Telephone Encounter (Signed)
I spoke with patient and she will come in today for labs.

## 2019-01-15 LAB — PROTEIN ELECTROPHORESIS, SERUM
Abnormal Protein Band1: 0.5 g/dL — ABNORMAL HIGH
Albumin ELP: 3.9 g/dL (ref 3.8–4.8)
Alpha 1: 0.4 g/dL — ABNORMAL HIGH (ref 0.2–0.3)
Alpha 2: 0.6 g/dL (ref 0.5–0.9)
Beta 2: 0.3 g/dL (ref 0.2–0.5)
Beta Globulin: 0.5 g/dL (ref 0.4–0.6)
Gamma Globulin: 1.1 g/dL (ref 0.8–1.7)
Total Protein: 6.8 g/dL (ref 6.1–8.1)

## 2019-01-15 LAB — IMMUNOFIXATION ELECTROPHORESIS
IgG (Immunoglobin G), Serum: 1195 mg/dL (ref 600–1540)
IgM, Serum: 70 mg/dL (ref 50–300)
Immunofix Electr Int: DETECTED
Immunoglobulin A: 126 mg/dL (ref 70–320)

## 2019-01-15 LAB — VITAMIN B1: Vitamin B1 (Thiamine): 10 nmol/L (ref 8–30)

## 2019-01-15 LAB — COPPER, SERUM: Copper: 129 ug/dL (ref 70–175)

## 2019-01-16 ENCOUNTER — Telehealth: Payer: Self-pay | Admitting: Neurology

## 2019-01-16 NOTE — Telephone Encounter (Signed)
Called and to discuss results of patient's laboratory testing.  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  To better understand the significance of this findings, I recommend seeking the opinion of hematology.  Labs were checked after her MRI brain did not disclose stroke as a cause of her facial paresthesias.  OK to stop aspirin 35m, she has no cardiac history or history of stroke, and my overall suspicion for TIA is low.  Vitamin B12, vitamin B1, folate, copper, ESR is normal. All questions were answered.  Sheila Bail K. PPosey Pronto DO

## 2019-01-17 ENCOUNTER — Telehealth: Payer: Self-pay | Admitting: Hematology

## 2019-01-17 ENCOUNTER — Other Ambulatory Visit: Payer: Self-pay | Admitting: *Deleted

## 2019-01-17 DIAGNOSIS — D472 Monoclonal gammopathy: Secondary | ICD-10-CM

## 2019-01-17 NOTE — Telephone Encounter (Signed)
Referral sent to hematology

## 2019-01-17 NOTE — Telephone Encounter (Signed)
Received a hem referral from Dr. Posey Pronto for  IgG monoclonal protein disorder. Pt has been cld and scheduled to see Dr. Irene Limbo on 5/19 at 10am. Pt aware to arrive 15 minutes early. Letter mailed.

## 2019-01-30 ENCOUNTER — Encounter: Payer: Self-pay | Admitting: Hematology

## 2019-02-01 NOTE — Progress Notes (Signed)
   Virtual Visit via Telephone Note The purpose of this virtual visit is to provide medical care while limiting exposure to the novel coronavirus.    Consent was obtained for telephone visit:  Yes.   Answered questions that patient had about telehealth interaction:  Yes.   I discussed the limitations, risks, security and privacy concerns of performing an evaluation and management service by telemedicine. I also discussed with the patient that there may be a patient responsible charge related to this service. The patient expressed understanding and agreed to proceed.  Pt location: Home Physician Location: office Name of referring provider:  Martinique, Betty G, MD I connected with Sheila Hartman at patients initiation/request on 02/02/2019 at 11:00 AM EDT by video enabled telemedicine application and verified that I am speaking with the correct person using two identifiers. Pt MRN:  [id number] Pt DOB:  Feb 17, 1951 Video Participants:  Sheila Hartman   History of Present Illness: This is a 68 y.o. female returning for follow-up of left facial tingling.  Since her last visit, she underwent MRI/A of the brain to evaluate for TIA, these findings were essentially normal.  Laboratory testing including ESR, copper, folate, vitamin B12, vitamin B1 was normal.  There was evidence of IgG kappa monoclonal protein on SPEP and she will be seeing hematology next week.   She has had no further spells of left facial tingling.  No limb paresthesias or weakness.  Overall, she is feeling well and has no new complaints.    DATA: MRI/A brain 01/10/2019: 1. No acute intracranial abnormality. Mild chronic microvascular ischemic change with mild progression since 2013 2. Negative MRA head  Labs 01/11/2019: ESR 30, copper 129, folate 13.6, vitamin B1 10, vitamin B12 298, SPEP with IFE IgG kappa gammopathy*  Assessment and Plan:  1. Left facial tingling, unclear etiology.  No evidence of cerebrovascular disease  on MRI/A and therefore the likelihood of TIA is low. Fortunately, she has not had any more spells.  I will continue to monitor her and have asked her to call my office, if she develops this again.   2. IgG kappa monoclonal protein.  She will be seeing Dr. Irene Limbo next week to determine the significance of this, whose care is appreciated   Follow Up Instructions:   I discussed the assessment and treatment plan with the patient. The patient was provided an opportunity to ask questions and all were answered. The patient agreed with the plan and demonstrated an understanding of the instructions.   The patient was advised to call back or seek an in-person evaluation if the symptoms worsen or if the condition fails to improve as anticipated.  Follow-up in 3-4 months  Total time spent:  15 minutes     Alda Berthold, DO

## 2019-02-02 ENCOUNTER — Other Ambulatory Visit: Payer: Self-pay

## 2019-02-02 ENCOUNTER — Telehealth (INDEPENDENT_AMBULATORY_CARE_PROVIDER_SITE_OTHER): Payer: Medicare Other | Admitting: Neurology

## 2019-02-02 DIAGNOSIS — R202 Paresthesia of skin: Secondary | ICD-10-CM | POA: Diagnosis not present

## 2019-02-02 DIAGNOSIS — R200 Anesthesia of skin: Secondary | ICD-10-CM
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
## 2019-02-06 ENCOUNTER — Telehealth: Payer: Self-pay | Admitting: Hematology

## 2019-02-06 ENCOUNTER — Inpatient Hospital Stay: Payer: Medicare Other | Attending: Hematology | Admitting: Hematology

## 2019-02-06 ENCOUNTER — Other Ambulatory Visit: Payer: Self-pay

## 2019-02-06 ENCOUNTER — Inpatient Hospital Stay: Payer: Medicare Other

## 2019-02-06 VITALS — BP 165/96 | HR 100 | Temp 98.7°F | Resp 20 | Ht 66.0 in | Wt 295.9 lb

## 2019-02-06 DIAGNOSIS — D509 Iron deficiency anemia, unspecified: Secondary | ICD-10-CM | POA: Diagnosis not present

## 2019-02-06 DIAGNOSIS — D472 Monoclonal gammopathy: Secondary | ICD-10-CM

## 2019-02-06 DIAGNOSIS — Z79899 Other long term (current) drug therapy: Secondary | ICD-10-CM

## 2019-02-06 LAB — CBC WITH DIFFERENTIAL/PLATELET
Abs Immature Granulocytes: 0.03 10*3/uL (ref 0.00–0.07)
Basophils Absolute: 0 10*3/uL (ref 0.0–0.1)
Basophils Relative: 0 %
Eosinophils Absolute: 0.1 10*3/uL (ref 0.0–0.5)
Eosinophils Relative: 1 %
HCT: 40.6 % (ref 36.0–46.0)
Hemoglobin: 12.3 g/dL (ref 12.0–15.0)
Immature Granulocytes: 0 %
Lymphocytes Relative: 13 %
Lymphs Abs: 0.9 10*3/uL (ref 0.7–4.0)
MCH: 24.5 pg — ABNORMAL LOW (ref 26.0–34.0)
MCHC: 30.3 g/dL (ref 30.0–36.0)
MCV: 80.9 fL (ref 80.0–100.0)
Monocytes Absolute: 0.5 10*3/uL (ref 0.1–1.0)
Monocytes Relative: 7 %
Neutro Abs: 5.4 10*3/uL (ref 1.7–7.7)
Neutrophils Relative %: 79 %
Platelets: 284 10*3/uL (ref 150–400)
RBC: 5.02 MIL/uL (ref 3.87–5.11)
RDW: 17.6 % — ABNORMAL HIGH (ref 11.5–15.5)
WBC: 6.9 10*3/uL (ref 4.0–10.5)
nRBC: 0 % (ref 0.0–0.2)

## 2019-02-06 LAB — CMP (CANCER CENTER ONLY)
ALT: 16 U/L (ref 0–44)
AST: 17 U/L (ref 15–41)
Albumin: 4 g/dL (ref 3.5–5.0)
Alkaline Phosphatase: 122 U/L (ref 38–126)
Anion gap: 9 (ref 5–15)
BUN: 12 mg/dL (ref 8–23)
CO2: 25 mmol/L (ref 22–32)
Calcium: 9.6 mg/dL (ref 8.9–10.3)
Chloride: 109 mmol/L (ref 98–111)
Creatinine: 0.91 mg/dL (ref 0.44–1.00)
GFR, Est AFR Am: >60 mL/min (ref >60)
GFR, Estimated: >60 mL/min (ref >60)
Glucose, Bld: 111 mg/dL — ABNORMAL HIGH (ref 70–99)
Potassium: 3.7 mmol/L (ref 3.5–5.1)
Sodium: 143 mmol/L (ref 135–145)
Total Bilirubin: 0.3 mg/dL (ref 0.3–1.2)
Total Protein: 7.7 g/dL (ref 6.5–8.1)

## 2019-02-06 LAB — IRON AND TIBC
Iron: 31 ug/dL — ABNORMAL LOW (ref 41–142)
Saturation Ratios: 7 % — ABNORMAL LOW (ref 21–57)
TIBC: 418 ug/dL (ref 236–444)
UIBC: 387 ug/dL — ABNORMAL HIGH (ref 120–384)

## 2019-02-06 LAB — LACTATE DEHYDROGENASE: LDH: 150 U/L (ref 98–192)

## 2019-02-06 LAB — FERRITIN: Ferritin: 11 ng/mL (ref 11–307)

## 2019-02-06 NOTE — Telephone Encounter (Signed)
Scheduled appt per 5/19 los. Left a voice message stating the appt date and time and that the appt was going to be a telephone visit only.

## 2019-02-07 LAB — MULTIPLE MYELOMA PANEL, SERUM
Albumin SerPl Elph-Mcnc: 3.7 g/dL (ref 2.9–4.4)
Albumin/Glob SerPl: 1.1 (ref 0.7–1.7)
Alpha 1: 0.3 g/dL (ref 0.0–0.4)
Alpha2 Glob SerPl Elph-Mcnc: 0.6 g/dL (ref 0.4–1.0)
B-Globulin SerPl Elph-Mcnc: 1.2 g/dL (ref 0.7–1.3)
Gamma Glob SerPl Elph-Mcnc: 1.3 g/dL (ref 0.4–1.8)
Globulin, Total: 3.4 g/dL (ref 2.2–3.9)
IgA: 127 mg/dL (ref 87–352)
IgG (Immunoglobin G), Serum: 1312 mg/dL (ref 586–1602)
IgM (Immunoglobulin M), Srm: 68 mg/dL (ref 26–217)
M Protein SerPl Elph-Mcnc: 0.5 g/dL — ABNORMAL HIGH
Total Protein ELP: 7.1 g/dL (ref 6.0–8.5)

## 2019-02-07 LAB — KAPPA/LAMBDA LIGHT CHAINS
Kappa free light chain: 32.1 mg/L — ABNORMAL HIGH (ref 3.3–19.4)
Kappa, lambda light chain ratio: 3.41 — ABNORMAL HIGH (ref 0.26–1.65)
Lambda free light chains: 9.4 mg/L (ref 5.7–26.3)

## 2019-02-08 LAB — HEMOGLOBINOPATHY EVALUATION
Hgb A2 Quant: 2.1 % (ref 1.8–3.2)
Hgb A: 97.9 % (ref 96.4–98.8)
Hgb C: 0 %
Hgb F Quant: 0 % (ref 0.0–2.0)
Hgb S Quant: 0 %
Hgb Variant: 0 %

## 2019-02-13 ENCOUNTER — Other Ambulatory Visit: Payer: Self-pay

## 2019-02-13 ENCOUNTER — Ambulatory Visit (HOSPITAL_COMMUNITY)
Admission: RE | Admit: 2019-02-13 | Discharge: 2019-02-13 | Disposition: A | Discharge status: Home / Self Care | Payer: Medicare Other | Source: Ambulatory Visit | Attending: Hematology | Admitting: Hematology

## 2019-02-13 DIAGNOSIS — D472 Monoclonal gammopathy: Secondary | ICD-10-CM

## 2019-02-14 ENCOUNTER — Other Ambulatory Visit: Payer: Self-pay | Admitting: *Deleted

## 2019-02-14 DIAGNOSIS — D472 Monoclonal gammopathy: Secondary | ICD-10-CM

## 2019-02-14 DIAGNOSIS — Z79899 Other long term (current) drug therapy: Secondary | ICD-10-CM | POA: Diagnosis not present

## 2019-02-14 DIAGNOSIS — D509 Iron deficiency anemia, unspecified: Secondary | ICD-10-CM | POA: Diagnosis not present

## 2019-02-15 LAB — UPEP/TP, 24-HR URINE
Albumin, U: 100 %
Alpha 1, Urine: 0 %
Alpha 2, Urine: 0 %
Beta, Urine: 0 %
Gamma Globulin, Urine: 0 %
Total Protein, Urine-Ur/day: 81 mg/24 hr (ref 30–150)
Total Protein, Urine: 5.8 mg/dL
Total Volume: 1400

## 2019-03-02 ENCOUNTER — Telehealth: Payer: Self-pay | Admitting: Genetic Counselor

## 2019-03-02 NOTE — Telephone Encounter (Signed)
Patient returned phone call regarding voicemail that was left about [employer], patient is notified and e-mail has been sent.

## 2019-03-05 ENCOUNTER — Ambulatory Visit: Payer: Medicare Other | Admitting: Neurology
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
## 2019-03-06 ENCOUNTER — Other Ambulatory Visit: Payer: Self-pay | Admitting: Hematology

## 2019-03-06 ENCOUNTER — Other Ambulatory Visit: Payer: Self-pay | Admitting: Genetic Counselor

## 2019-03-06 ENCOUNTER — Inpatient Hospital Stay: Payer: Medicare Other | Attending: Hematology | Admitting: Hematology

## 2019-03-06 ENCOUNTER — Telehealth: Payer: Self-pay | Admitting: Genetic Counselor

## 2019-03-06 DIAGNOSIS — E538 Deficiency of other specified B group vitamins: Secondary | ICD-10-CM | POA: Diagnosis not present

## 2019-03-06 DIAGNOSIS — Z86010 Personal history of colonic polyps: Secondary | ICD-10-CM

## 2019-03-06 DIAGNOSIS — D472 Monoclonal gammopathy: Secondary | ICD-10-CM

## 2019-03-06 DIAGNOSIS — D509 Iron deficiency anemia, unspecified: Secondary | ICD-10-CM

## 2019-03-06 NOTE — Telephone Encounter (Signed)
Called pt no answer left mess on machine.

## 2019-03-07 ENCOUNTER — Inpatient Hospital Stay (HOSPITAL_BASED_OUTPATIENT_CLINIC_OR_DEPARTMENT_OTHER): Payer: Medicare Other | Admitting: Genetic Counselor

## 2019-03-07 ENCOUNTER — Telehealth: Payer: Self-pay | Admitting: Hematology

## 2019-03-07 ENCOUNTER — Inpatient Hospital Stay: Payer: Medicare Other

## 2019-03-07 ENCOUNTER — Encounter: Payer: Self-pay | Admitting: Genetic Counselor

## 2019-03-07 DIAGNOSIS — Z800 Family history of malignant neoplasm of digestive organs: Secondary | ICD-10-CM | POA: Diagnosis not present

## 2019-03-07 DIAGNOSIS — Z86010 Personal history of colonic polyps: Secondary | ICD-10-CM | POA: Diagnosis not present

## 2019-03-07 DIAGNOSIS — Z1379 Encounter for other screening for genetic and chromosomal anomalies: Secondary | ICD-10-CM | POA: Diagnosis not present

## 2019-03-07 DIAGNOSIS — Z803 Family history of malignant neoplasm of breast: Secondary | ICD-10-CM

## 2019-03-07 NOTE — Telephone Encounter (Signed)
Scheduled appt per 6/16 los. Spoke with patient and patient aware of appt date and time.
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
## 2019-03-12 ENCOUNTER — Other Ambulatory Visit: Payer: Self-pay | Admitting: Family Medicine

## 2019-03-12 DIAGNOSIS — Z1231 Encounter for screening mammogram for malignant neoplasm of breast: Secondary | ICD-10-CM

## 2019-03-14 ENCOUNTER — Encounter: Payer: Self-pay | Admitting: Podiatry

## 2019-03-14 ENCOUNTER — Ambulatory Visit (INDEPENDENT_AMBULATORY_CARE_PROVIDER_SITE_OTHER): Payer: Medicare Other

## 2019-03-14 ENCOUNTER — Other Ambulatory Visit: Payer: Self-pay

## 2019-03-14 ENCOUNTER — Other Ambulatory Visit: Payer: Self-pay | Admitting: Podiatry

## 2019-03-14 ENCOUNTER — Ambulatory Visit: Payer: Medicare Other | Admitting: Podiatry

## 2019-03-14 VITALS — BP 142/90 | Temp 96.3°F

## 2019-03-14 DIAGNOSIS — M79672 Pain in left foot: Secondary | ICD-10-CM

## 2019-03-14 DIAGNOSIS — M21612 Bunion of left foot: Secondary | ICD-10-CM

## 2019-03-14 DIAGNOSIS — M21619 Bunion of unspecified foot: Secondary | ICD-10-CM

## 2019-03-14 DIAGNOSIS — M79674 Pain in right toe(s): Secondary | ICD-10-CM

## 2019-03-14 DIAGNOSIS — M10071 Idiopathic gout, right ankle and foot: Secondary | ICD-10-CM

## 2019-03-14 DIAGNOSIS — M779 Enthesopathy, unspecified: Secondary | ICD-10-CM

## 2019-03-14 DIAGNOSIS — M79671 Pain in right foot: Secondary | ICD-10-CM

## 2019-03-14 DIAGNOSIS — B351 Tinea unguium: Secondary | ICD-10-CM | POA: Diagnosis not present

## 2019-03-14 DIAGNOSIS — M1000 Idiopathic gout, unspecified site: Secondary | ICD-10-CM

## 2019-03-14 DIAGNOSIS — M79675 Pain in left toe(s): Secondary | ICD-10-CM

## 2019-03-14 NOTE — Patient Instructions (Signed)

## 2019-03-18 NOTE — Progress Notes (Signed)
Subjective:   Patient ID: Sheila Hartman, female   DOB: 68 y.o.   MRN: [id number]   HPI Patient presents stating she is had a lot of inflammation around the big toe joint right foot and it is been going on just for the last few days.  Does not remember injury or any other cause for this and patient does not smoke and likes to be active   Review of Systems  All other systems reviewed and are negative.       Objective:  Physical Exam Vitals signs and nursing note reviewed.  Constitutional:      Appearance: She is well-developed.  Pulmonary:     Effort: Pulmonary effort is normal.  Musculoskeletal: Normal range of motion.  Skin:    General: Skin is warm.  Neurological:     Mental Status: She is alert.     Neurovascular status intact muscle strength was found to be moderately diminished with range of motion adequate.  Patient is found to have quite a bit of redness and inflammation with fluid buildup around the big toe joint right with quite a bit of pain with palpation.  Patient was noted to have good digital perfusion well oriented x3 with moderate depression of the arch noted bilateral     Assessment:  Probability for acute capsulitis with possibility for gout first MPJ with structural bunion deformity     Plan:  H&P condition reviewed x-ray reviewed.  Today I went ahead did sterile prep and injected the first MPJ 3 mg Dexasone Kenalog 5 mg Xylocaine advised on soaks wider shoes and reappoint if symptoms were to persist and I educated her on gout today and foods to be careful off  X-rays indicate that there is structural bunion deformity right with no indications of ostial lysis around the joint surface

## 2019-03-22 DIAGNOSIS — H33051 Total retinal detachment, right eye: Secondary | ICD-10-CM | POA: Diagnosis not present

## 2019-03-22 DIAGNOSIS — H35412 Lattice degeneration of retina, left eye: Secondary | ICD-10-CM | POA: Diagnosis not present

## 2019-03-22 DIAGNOSIS — E70319 Ocular albinism, unspecified: Secondary | ICD-10-CM | POA: Diagnosis not present

## 2019-03-22 DIAGNOSIS — D3132 Benign neoplasm of left choroid: Secondary | ICD-10-CM | POA: Diagnosis not present

## 2019-03-22 DIAGNOSIS — H31092 Other chorioretinal scars, left eye: Secondary | ICD-10-CM | POA: Diagnosis not present

## 2019-03-28 ENCOUNTER — Other Ambulatory Visit: Payer: Self-pay | Admitting: Family Medicine

## 2019-03-28 DIAGNOSIS — K219 Gastro-esophageal reflux disease without esophagitis: Secondary | ICD-10-CM

## 2019-04-25 ENCOUNTER — Ambulatory Visit
Admission: RE | Admit: 2019-04-25 | Discharge: 2019-04-25 | Disposition: A | Discharge status: Home / Self Care | Payer: Medicare Other | Source: Ambulatory Visit | Attending: Family Medicine | Admitting: Family Medicine

## 2019-04-25 ENCOUNTER — Other Ambulatory Visit: Payer: Self-pay

## 2019-04-25 DIAGNOSIS — Z1231 Encounter for screening mammogram for malignant neoplasm of breast: Secondary | ICD-10-CM | POA: Diagnosis not present

## 2019-05-01 ENCOUNTER — Ambulatory Visit: Payer: Medicare Other

## 2019-05-20 NOTE — Progress Notes (Signed)
Follow-up Visit   Date: 05/21/19   Sheila Hartman MRN: [id number] DOB: 03/20/1951   Interim History: Sheila Hartman is a 68 y.o. African American female with hypertension, GERD, and depression returning to the clinic for follow-up of right facial tingling.  The patient was accompanied to the clinic by self.   History of present illness: She presented to the ER on 10/26/2018 with right cheek and lower lip tingling. There was no associated left arm or leg paresthesias.  She did not have facial droop or limb weakness.  CT head was negative.  Since this time, she has continued to have about 5 spells of tingling over the right cheek, which lasts about 10 minutes and then resolves.  No other associated neurological symptoms.  She does not have history of hyperlipidemia, diabetes, or tobacco use.  Blood pressure, per patient, is well controlled on medications.  She does not take a daily aspirin.  There is family history of coronary artery disease in both parents.  She lives at home with her daughter.    UPDATE 05/21/2019:  She has not had any further spells of right facial tingling.  She does report having throbbing headaches, once every few weeks, which can be triggered by stress.  Sometimes, there is bright light in the right eye with the pain and numbness over the right base of the scalp, but this is not often. She does not take NSAIDs or tylenol and prefers to rest, which helps alleviate her headache.   She denies any new neurological complaints.   Prior evaluation has included MRI/A of the brain to evaluate for TIA, these findings were essentially normal.  Laboratory testing including ESR, copper, folate, vitamin B12, vitamin B1 was normal.  There was evidence of IgG kappa monoclonal protein on SPEP and she has had normal evaluation by Dr. Irene Limbo.   Medications:  Current Outpatient Medications on File Prior to Visit  Medication Sig Dispense Refill  . amLODipine-benazepril (LOTREL)  5-20 MG capsule Take 1 capsule by mouth daily. 90 capsule 2  . chlorhexidine (PERIDEX) 0.12 % solution 5 mLs by Mouth Rinse route as needed for mouth pain.    . diazepam (VALIUM) 5 MG tablet Take 1 tablet 30-min prior to MRI. 2 tablet 0  . ferrous sulfate 325 (65 FE) MG tablet Take 325 mg by mouth 2 (two) times daily with a meal.    . omeprazole (PRILOSEC) 40 MG capsule TAKE 1 CAPSULE BY MOUTH EVERY DAY 90 capsule 0  . sertraline (ZOLOFT) 50 MG tablet Take 1 tablet (50 mg total) by mouth daily. 90 tablet 1  . vitamin C (ASCORBIC ACID) 500 MG tablet Take 500 mg by mouth daily.     No current facility-administered medications on file prior to visit.     Allergies:  Allergies  Allergen Reactions  . Penicillins Hives    Review of Systems:  CONSTITUTIONAL: No fevers, chills, night sweats, or weight loss.  EYES: No visual changes or eye pain ENT: No hearing changes.  No history of nose bleeds.   RESPIRATORY: No cough, wheezing and shortness of breath.   CARDIOVASCULAR: Negative for chest pain, and palpitations.   GI: Negative for abdominal discomfort, blood in stools or black stools.  No recent change in bowel habits.   GU:  No history of incontinence.   MUSCLOSKELETAL: No history of joint pain or swelling.  No myalgias.   SKIN: Negative for lesions, rash, and itching.   ENDOCRINE: Negative for cold  or heat intolerance, polydipsia or goiter.   PSYCH:  No depression or anxiety symptoms.   NEURO: As Above.   Vital Signs:  BP 140/88   Pulse 74   Ht _0  (1.727 m)   Wt (!) 303 lb (137.4 kg)   SpO2 98%   BMI 46.07 kg/m    General Medical Exam:   General:  Well appearing, comfortable  Eyes/ENT: see cranial nerve examination.   Neck:  No carotid bruits. Respiratory:  Clear to auscultation, good air entry bilaterally.   Cardiac:  Regular rate and rhythm, no murmur.   Ext:  No edema   Neurological Exam: MENTAL STATUS including orientation to time, place, person, recent and remote  memory, attention span and concentration, language, and fund of knowledge is normal.  Speech is not dysarthric.  CRANIAL NERVES:  No visual field defects.  Pupils equal round and reactive to light.  Normal conjugate eyes, with non-fatigable nystagmus at rest and worse with end-gaze in all directions.  No ptosis.  Face is symmetric. Palate elevates symmetrically.  Tongue is midline.  MOTOR:  Motor strength is 5/5 in all extremities.  No atrophy, fasciculations or abnormal movements.  No pronator drift.  Tone is normal.  Facial sensation intact.  COORDINATION/GAIT:  Normal finger-to- nose-finger.  Intact rapid alternating movements bilaterally.  Gait narrow based and stable.   Data: MRI/A brain 01/10/2019: 1. No acute intracranial abnormality. Mild chronic microvascular ischemic change with mild progression since 2013 2. Negative MRA head  Labs 01/11/2019: ESR 30, copper 129, folate 13.6, vitamin B1 10, vitamin B12 298, SPEP with IFE IgG kappa gammopathy*   IMPRESSION/PLAN: 1.  Migraine equivalent syndrome with associated facial paresthesia and scotomas.  Headaches are infrequent and treated with rest.  No evidence of cerebrovascular disease on MRI/A and therefore the likelihood of TIA is low. Fortunately, she has not had any more spells.   2. IgG kappa monoclonal protein, appreciate Dr. Grier Mitts evaluation which has been unrevealing thus far.  Return to clinic as needed  Thank you for allowing me to participate in patient's care.  If I can answer any additional questions, I would be pleased to do so.    Sincerely,    Cristie Mckinney K. Posey Pronto, DO

## 2019-05-21 ENCOUNTER — Ambulatory Visit (INDEPENDENT_AMBULATORY_CARE_PROVIDER_SITE_OTHER): Payer: Medicare Other | Admitting: Neurology

## 2019-05-21 ENCOUNTER — Other Ambulatory Visit: Payer: Self-pay

## 2019-05-21 ENCOUNTER — Encounter: Payer: Self-pay | Admitting: Neurology

## 2019-05-21 VITALS — BP 140/88 | HR 74 | Ht 68.0 in | Wt 303.0 lb

## 2019-05-21 DIAGNOSIS — R202 Paresthesia of skin: Secondary | ICD-10-CM | POA: Diagnosis not present

## 2019-05-21 DIAGNOSIS — R200 Anesthesia of skin: Secondary | ICD-10-CM

## 2019-05-21 DIAGNOSIS — H5501 Congenital nystagmus: Secondary | ICD-10-CM | POA: Diagnosis not present

## 2019-05-21 DIAGNOSIS — G43109 Migraine with aura, not intractable, without status migrainosus: Secondary | ICD-10-CM

## 2019-05-21 NOTE — Patient Instructions (Signed)
It was great to see you today!  If you have any new neurological concerns, please come back and see me 

## 2019-06-08 ENCOUNTER — Encounter: Payer: Self-pay | Admitting: Podiatry

## 2019-06-08 ENCOUNTER — Other Ambulatory Visit: Payer: Self-pay

## 2019-06-08 ENCOUNTER — Ambulatory Visit (INDEPENDENT_AMBULATORY_CARE_PROVIDER_SITE_OTHER): Payer: Medicare Other | Admitting: Podiatry

## 2019-06-08 DIAGNOSIS — M79675 Pain in left toe(s): Secondary | ICD-10-CM

## 2019-06-08 DIAGNOSIS — M79674 Pain in right toe(s): Secondary | ICD-10-CM

## 2019-06-08 DIAGNOSIS — B351 Tinea unguium: Secondary | ICD-10-CM

## 2019-06-08 NOTE — Patient Instructions (Signed)

## 2019-06-14 NOTE — Progress Notes (Signed)
Subjective: Sheila Hartman is seen today for follow up painful, elongated, thickened toenails 1-5 b/l feet that she cannot cut. Pain interferes with daily activities. Aggravating factor includes wearing enclosed shoe gear and relieved with periodic debridement.  Current Outpatient Medications on File Prior to Visit  Medication Sig  . amLODipine-benazepril (LOTREL) 5-20 MG capsule Take 1 capsule by mouth daily.  . chlorhexidine (PERIDEX) 0.12 % solution 5 mLs by Mouth Rinse route as needed for mouth pain.  . diazepam (VALIUM) 5 MG tablet Take 1 tablet 30-min prior to MRI.  . ferrous sulfate 325 (65 FE) MG tablet Take 325 mg by mouth 2 (two) times daily with a meal.  . omeprazole (PRILOSEC) 40 MG capsule TAKE 1 CAPSULE BY MOUTH EVERY DAY  . sertraline (ZOLOFT) 50 MG tablet Take 1 tablet (50 mg total) by mouth daily.  . vitamin C (ASCORBIC ACID) 500 MG tablet Take 500 mg by mouth daily.   No current facility-administered medications on file prior to visit.      Allergies  Allergen Reactions  . Penicillins Hives     Objective:  Vascular Examination: Capillary refill time immediate x 10 digits.  Dorsalis pedis present b/l.  Posterior tibial pulses present b/l.  Digital hair  present x 10 digits.  Skin temperature gradient WNL b/l.   Dermatological Examination: Skin with normal turgor, texture and tone b/l  Toenails 1-5 b/l discolored, thick, dystrophic with subungual debris and pain with palpation to nailbeds due to thickness of nails.  Musculoskeletal: Muscle strength 5/5 to all LE muscle groups b/l.  HAV with bunion b/l.   Neurological Examination: Protective sensation intact with 10 gram monofilament bilaterally.  Assessment: Painful onychomycosis toenails 1-5 b/l   Plan: 1. Toenails 1-5 b/l were debrided in length and girth without iatrogenic bleeding. 2. Patient to continue soft, supportive shoe gear 3. Patient to report any pedal injuries to medical  professional immediately. 4. Follow up 3 months.  5. Patient/POA to call should there be a concern in the interim.

## 2019-06-17 ENCOUNTER — Other Ambulatory Visit: Payer: Self-pay | Admitting: Family Medicine

## 2019-06-17 DIAGNOSIS — F419 Anxiety disorder, unspecified: Secondary | ICD-10-CM

## 2019-06-18 NOTE — Telephone Encounter (Signed)
Routing to PCP for approval.

## 2019-06-19 ENCOUNTER — Other Ambulatory Visit: Payer: Self-pay | Admitting: Genetic Counselor

## 2019-06-30 ENCOUNTER — Other Ambulatory Visit: Payer: Self-pay | Admitting: Family Medicine

## 2019-06-30 DIAGNOSIS — K219 Gastro-esophageal reflux disease without esophagitis: Secondary | ICD-10-CM

## 2019-07-06 ENCOUNTER — Encounter: Payer: Self-pay | Admitting: Family Medicine

## 2019-07-06 ENCOUNTER — Other Ambulatory Visit: Payer: Self-pay

## 2019-07-06 ENCOUNTER — Ambulatory Visit (INDEPENDENT_AMBULATORY_CARE_PROVIDER_SITE_OTHER): Payer: Medicare Other | Admitting: Family Medicine

## 2019-07-06 VITALS — BP 120/84 | HR 84 | Temp 97.8°F | Resp 16 | Ht 68.0 in | Wt 300.5 lb

## 2019-07-06 DIAGNOSIS — D509 Iron deficiency anemia, unspecified: Secondary | ICD-10-CM

## 2019-07-06 DIAGNOSIS — K219 Gastro-esophageal reflux disease without esophagitis: Secondary | ICD-10-CM

## 2019-07-06 DIAGNOSIS — Z23 Encounter for immunization: Secondary | ICD-10-CM

## 2019-07-06 DIAGNOSIS — Z0000 Encounter for general adult medical examination without abnormal findings: Secondary | ICD-10-CM

## 2019-07-06 DIAGNOSIS — F419 Anxiety disorder, unspecified: Secondary | ICD-10-CM

## 2019-07-06 DIAGNOSIS — I10 Essential (primary) hypertension: Secondary | ICD-10-CM

## 2019-07-06 LAB — BASIC METABOLIC PANEL
BUN: 14 mg/dL (ref 6–23)
CO2: 26 mEq/L (ref 19–32)
Calcium: 9.5 mg/dL (ref 8.4–10.5)
Chloride: 107 mEq/L (ref 96–112)
Creatinine, Ser: 0.75 mg/dL (ref 0.40–1.20)
GFR: 93.52 mL/min (ref >60.00)
Glucose, Bld: 93 mg/dL (ref 70–99)
Potassium: 3.9 mEq/L (ref 3.5–5.1)
Sodium: 142 mEq/L (ref 135–145)

## 2019-07-06 MED ORDER — AMLODIPINE BESY-BENAZEPRIL HCL 5-20 MG PO CAPS: 1.0000 | ORAL_CAPSULE | Freq: Every day | ORAL | 2 refills | Status: DC

## 2019-07-06 MED ORDER — FERROUS SULFATE 325 (65 FE) MG PO TABS: ORAL_TABLET | ORAL | 2 refills | Status: DC

## 2019-07-06 MED ORDER — SERTRALINE HCL 50 MG PO TABS: 50.0000 mg | ORAL_TABLET | Freq: Every day | ORAL | 3 refills | Status: DC
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
## 2019-07-06 NOTE — Patient Instructions (Addendum)
  Ms. Janey , Thank you for taking time to come for your Medicare Wellness Visit. I appreciate your ongoing commitment to your health goals. Please review the following plan we discussed and let me know if I can assist you in the future.   These are the goals we discussed: Goals    . Blood Pressure < 140/90    . Exercise 150 min/wk Moderate Activity     Started walking 30 minutes 3 days at the gym     . Increase water intake    . Weight (lb) < 284 lb (128.8 kg)     7% weight loss       This is a list of the screening recommended for you and due dates:  Health Maintenance  Topic Date Due  . Tetanus Vaccine  05/24/1972  . DEXA scan (bone density measurement)  05/24/2018  . Pneumonia vaccines (2 of 2 - PPSV23) 10/24/2019  . Mammogram  04/24/2021  . Colon Cancer Screening  11/27/2021  . Flu Shot  Completed  .  Hepatitis C: One time screening is recommended by Center for Disease Control  (CDC) for  adults born from [birth year] through 1963.   Completed   A few things to remember from today's visit:   Essential hypertension - Plan: Basic metabolic panel  Anxiety disorder, unspecified type  Gastroesophageal reflux disease, unspecified whether esophagitis present  Iron deficiency anemia, unspecified iron deficiency anemia type   Continue taking iron supplementation every other day with vitamin C.

## 2019-07-10 ENCOUNTER — Encounter: Payer: Self-pay | Admitting: Family Medicine

## 2019-07-17 ENCOUNTER — Telehealth: Payer: Self-pay | Admitting: Genetic Counselor

## 2019-07-17 NOTE — Telephone Encounter (Signed)
LM on VM that I had updated information on her latest saliva kit that was sent in the mail.  Asked that she please CB.

## 2019-07-24 NOTE — Telephone Encounter (Signed)
Patient called and we scheduled a blood draw for the day of her appointment with Dr. Irene Limbo.

## 2019-09-05 ENCOUNTER — Other Ambulatory Visit: Payer: Medicare Other

## 2019-09-07 ENCOUNTER — Other Ambulatory Visit: Payer: Self-pay

## 2019-09-07 ENCOUNTER — Ambulatory Visit (INDEPENDENT_AMBULATORY_CARE_PROVIDER_SITE_OTHER): Payer: Medicare Other | Admitting: Podiatry

## 2019-09-07 ENCOUNTER — Encounter: Payer: Self-pay | Admitting: Podiatry

## 2019-09-07 DIAGNOSIS — B351 Tinea unguium: Secondary | ICD-10-CM | POA: Diagnosis not present

## 2019-09-07 DIAGNOSIS — M79675 Pain in left toe(s): Secondary | ICD-10-CM | POA: Diagnosis not present

## 2019-09-07 DIAGNOSIS — M79674 Pain in right toe(s): Secondary | ICD-10-CM

## 2019-09-07 NOTE — Patient Instructions (Signed)

## 2019-09-12 ENCOUNTER — Encounter: Payer: Self-pay | Admitting: Hematology

## 2019-09-12 ENCOUNTER — Inpatient Hospital Stay: Payer: Medicare Other | Attending: Hematology | Admitting: Hematology

## 2019-09-12 ENCOUNTER — Inpatient Hospital Stay: Payer: Medicare Other

## 2019-09-12 ENCOUNTER — Other Ambulatory Visit: Payer: Self-pay

## 2019-09-12 VITALS — BP 145/71 | HR 91 | Temp 98.2°F | Resp 18 | Ht 68.0 in | Wt 308.7 lb

## 2019-09-12 DIAGNOSIS — E538 Deficiency of other specified B group vitamins: Secondary | ICD-10-CM

## 2019-09-12 DIAGNOSIS — Z803 Family history of malignant neoplasm of breast: Secondary | ICD-10-CM

## 2019-09-12 DIAGNOSIS — D472 Monoclonal gammopathy: Secondary | ICD-10-CM

## 2019-09-12 DIAGNOSIS — Z86010 Personal history of colonic polyps: Secondary | ICD-10-CM

## 2019-09-12 DIAGNOSIS — D509 Iron deficiency anemia, unspecified: Secondary | ICD-10-CM

## 2019-09-12 DIAGNOSIS — Z800 Family history of malignant neoplasm of digestive organs: Secondary | ICD-10-CM | POA: Diagnosis not present

## 2019-09-12 LAB — CBC WITH DIFFERENTIAL/PLATELET
Abs Immature Granulocytes: 0.03 10*3/uL (ref 0.00–0.07)
Basophils Absolute: 0 10*3/uL (ref 0.0–0.1)
Basophils Relative: 0 %
Eosinophils Absolute: 0.1 10*3/uL (ref 0.0–0.5)
Eosinophils Relative: 1 %
HCT: 41.3 % (ref 36.0–46.0)
Hemoglobin: 12.5 g/dL (ref 12.0–15.0)
Immature Granulocytes: 1 %
Lymphocytes Relative: 12 %
Lymphs Abs: 0.8 10*3/uL (ref 0.7–4.0)
MCH: 25 pg — ABNORMAL LOW (ref 26.0–34.0)
MCHC: 30.3 g/dL (ref 30.0–36.0)
MCV: 82.6 fL (ref 80.0–100.0)
Monocytes Absolute: 0.5 10*3/uL (ref 0.1–1.0)
Monocytes Relative: 8 %
Neutro Abs: 5.1 10*3/uL (ref 1.7–7.7)
Neutrophils Relative %: 78 %
Platelets: 264 10*3/uL (ref 150–400)
RBC: 5 MIL/uL (ref 3.87–5.11)
RDW: 14.9 % (ref 11.5–15.5)
WBC: 6.5 10*3/uL (ref 4.0–10.5)
nRBC: 0 % (ref 0.0–0.2)

## 2019-09-12 LAB — CMP (CANCER CENTER ONLY)
ALT: 18 U/L (ref 0–44)
AST: 20 U/L (ref 15–41)
Albumin: 3.9 g/dL (ref 3.5–5.0)
Alkaline Phosphatase: 107 U/L (ref 38–126)
Anion gap: 9 (ref 5–15)
BUN: 10 mg/dL (ref 8–23)
CO2: 27 mmol/L (ref 22–32)
Calcium: 9.1 mg/dL (ref 8.9–10.3)
Chloride: 107 mmol/L (ref 98–111)
Creatinine: 0.8 mg/dL (ref 0.44–1.00)
GFR, Est AFR Am: >60 mL/min (ref >60)
GFR, Estimated: >60 mL/min (ref >60)
Glucose, Bld: 117 mg/dL — ABNORMAL HIGH (ref 70–99)
Potassium: 3.4 mmol/L — ABNORMAL LOW (ref 3.5–5.1)
Sodium: 143 mmol/L (ref 135–145)
Total Bilirubin: 0.6 mg/dL (ref 0.3–1.2)
Total Protein: 7.4 g/dL (ref 6.5–8.1)

## 2019-09-12 LAB — VITAMIN B12: Vitamin B-12: 400 pg/mL (ref 180–914)

## 2019-09-12 LAB — FERRITIN: Ferritin: 16 ng/mL (ref 11–307)
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
## 2019-09-13 ENCOUNTER — Telehealth: Payer: Self-pay | Admitting: Hematology

## 2019-09-13 LAB — KAPPA/LAMBDA LIGHT CHAINS
Kappa free light chain: 33.3 mg/L — ABNORMAL HIGH (ref 3.3–19.4)
Kappa, lambda light chain ratio: 3.33 — ABNORMAL HIGH (ref 0.26–1.65)
Lambda free light chains: 10 mg/L (ref 5.7–26.3)

## 2019-09-13 NOTE — Telephone Encounter (Signed)
Scheduled appt per 12/23 los.  Sent a message to HIM pool to get a calendar mailed out. 

## 2019-09-16 NOTE — Progress Notes (Signed)
Subjective:  KOLBI LANING presents to clinic today with cc of  painful, thick, discolored, elongated toenails  of both feet that become tender and patient cannot cut because of thickness. Pain is aggravated when ambulating wearing enclosed shoe gear and relieved with periodic professional debridement.  Patient voices no new pedal concerns on today's visit.  Medications reviewed in chart.  Allergies  Allergen Reactions  . Penicillins Hives     Objective:  Physical Examination:  Vascular Examination: Capillary refill time immediate b/l.  Palpable DP/PT pulses b/l.  Digital hair present b/l.   No edema noted b/l.  Skin temperature gradient WNL b/l.  Dermatological Examination: Skin with normal turgor, texture and tone b/l.  No open wounds b/l.  No interdigital macerations noted b/l.  Elongated, thick, discolored brittle toenails with subungual debris and pain on dorsal palpation of nailbeds 1-5 b/l.  Musculoskeletal Examination: Muscle strength 5/5 to all muscle groups b/l.  HAV with bunion b/l.  No pain, crepitus or joint discomfort with active/passive ROM.  Neurological Examination: Sensation intact 5/5 b/l with 10 gram monofilament.  Assessment: Mycotic nail infection with pain 1-5 b/l  Plan: 1. Toenails 1-5 b/l were debrided in length and girth without iatrogenic laceration. 2.  Continue soft, supportive shoe gear daily. 3.  Report any pedal injuries to medical professional. 4.  Follow up 3 months. 5.  Patient/POA to call should there be a question/concern in there interim.

## 2019-09-17 LAB — MULTIPLE MYELOMA PANEL, SERUM
Albumin SerPl Elph-Mcnc: 3.8 g/dL (ref 2.9–4.4)
Albumin/Glob SerPl: 1.3 (ref 0.7–1.7)
Alpha 1: 0.2 g/dL (ref 0.0–0.4)
Alpha2 Glob SerPl Elph-Mcnc: 0.5 g/dL (ref 0.4–1.0)
B-Globulin SerPl Elph-Mcnc: 1.1 g/dL (ref 0.7–1.3)
Gamma Glob SerPl Elph-Mcnc: 1.2 g/dL (ref 0.4–1.8)
Globulin, Total: 3 g/dL (ref 2.2–3.9)
IgA: 112 mg/dL (ref 87–352)
IgG (Immunoglobin G), Serum: 1176 mg/dL (ref 586–1602)
IgM (Immunoglobulin M), Srm: 56 mg/dL (ref 26–217)
M Protein SerPl Elph-Mcnc: 0.6 g/dL — ABNORMAL HIGH
Total Protein ELP: 6.8 g/dL (ref 6.0–8.5)

## 2019-09-27 ENCOUNTER — Other Ambulatory Visit: Payer: Self-pay | Admitting: Family Medicine

## 2019-09-27 DIAGNOSIS — K219 Gastro-esophageal reflux disease without esophagitis: Secondary | ICD-10-CM

## 2019-09-27 NOTE — Telephone Encounter (Signed)
Last OV 07/06/19 Last refill 07/02/19 #90/0 Next OV not scheduled

## 2019-10-03 ENCOUNTER — Telehealth: Payer: Self-pay | Admitting: Family Medicine

## 2019-10-03 NOTE — Telephone Encounter (Signed)
I called Dr. Saul Fordyce office and gave them the information below.

## 2019-10-03 NOTE — Telephone Encounter (Signed)
This is not longer an indication for antibiotics before dental procedures unless she has a history of endocarditis or congenital valvular disease.  So I do not recommend antibiotic at this time unless her orthopedist or dentist recommended otherwise, in which case they can write the prescription. Thanks, BJ

## 2019-10-03 NOTE — Telephone Encounter (Signed)
Dr. Bennie Hind office called /Pt tol them ship had hip replacement/ Dr. Owens Shark needs to know if Pt will need antibiotics before or if having any dental work, from a routine cleaning to extensive dental work/ Pleas advise and you can send this info to the email address below    email address: [email or web address]

## 2019-10-08 ENCOUNTER — Encounter: Payer: Self-pay | Admitting: Genetic Counselor

## 2019-10-08 ENCOUNTER — Telehealth: Payer: Self-pay | Admitting: Genetic Counselor

## 2019-10-08 DIAGNOSIS — Z1379 Encounter for other screening for genetic and chromosomal anomalies: Secondary | ICD-10-CM | POA: Insufficient documentation

## 2019-10-08 NOTE — Telephone Encounter (Signed)
Revealed negative genetic testing.  Discussed that we do not know why she has polyposis or why there is cancer in the family. It could be due to a different gene that we are not testing, or maybe our current technology may not be able to pick something up.  It will be important for her to keep in contact with genetics to keep up with whether additional testing may be needed.   

## 2019-10-09 ENCOUNTER — Ambulatory Visit: Payer: Self-pay | Admitting: Genetic Counselor

## 2019-10-09 DIAGNOSIS — Z1379 Encounter for other screening for genetic and chromosomal anomalies: Secondary | ICD-10-CM

## 2019-10-09 NOTE — Progress Notes (Signed)
HPI:  [name] was previously seen in the [place] [facility] due to a family history of cancer and concerns regarding a hereditary predisposition to cancer. Please refer to our prior cancer genetics clinic note for more information regarding our discussion, assessment and recommendations, at the time. Sheila Hartman's recent genetic test results were disclosed to Sheila Hartman, as were recommendations warranted by these results. These results and recommendations are discussed in more detail below.  CANCER HISTORY:  Oncology History   No history exists.    FAMILY HISTORY:  We obtained a detailed, 4-generation family history.  Significant diagnoses are listed below: Family History  Problem Relation Age of Onset  . Diabetes Mother   . Kidney failure Mother   . Hypertension Mother   . Stroke Mother   . Kidney disease Mother   . Heart attack Mother   . Diabetes Father   . Hypertension Father   . Heart attack Father   . Lung cancer Brother   . Alcohol abuse Brother   . Diabetes Brother   . Lung cancer Sister   . Alcohol abuse Sister   . Stroke Paternal Grandmother   . Stroke Paternal Grandfather   . Alcohol abuse Brother   . Drug abuse Brother   . Cancer Maternal Uncle        unknown  . Breast cancer Paternal Aunt 14  . Heart attack Maternal Grandmother   . Leukemia Brother   . Pancreatic cancer Cousin 53  . Colon cancer Neg Hx   . Colon polyps Neg Hx   . Esophageal cancer Neg Hx   . Rectal cancer Neg Hx   . Stomach cancer Neg Hx     The patient does not have children.  She had three sisters and three brothers.  One brother and one sister had lung cancer, a brother has leukemia and another brother passed away from HIV.  Both parents are deceased.  The patient's mother had heart disease.  She had three brothers and two sisters.  One brother had an unknown form of cancer.  The maternal grandparents are deceased.  The patient's father had heart disease.  He had three  brothers and two sisters.  One sister had breast cancer at 90 and a brother had a son who had pancreatic cancer at 81.  Another brother had a son with leukemia in his 51's-40's.  There was no other cancer reported in the family.  Sheila Hartman is unaware of previous family history of genetic testing for hereditary cancer risks. Patient's maternal ancestors are of African American descent, and paternal ancestors are of African American descent. There is no reported Ashkenazi Jewish ancestry. There is no known consanguinity.     GENETIC TEST RESULTS: Genetic testing reported out on October 07, 2019 through the common hereditary  cancer panel found no pathogenic mutations. The Common Hereditary Gene Panel offered by [facility] includes sequencing and/or deletion duplication testing of the following 48 genes: APC, ATM, AXIN2, BARD1, BMPR1A, BRCA1, BRCA2, BRIP1, CDH1, CDK4, CDKN2A (p14ARF), CDKN2A (p16INK4a), CHEK2, CTNNA1, DICER1, EPCAM (Deletion/duplication testing only), GREM1 (promoter region deletion/duplication testing only), KIT, MEN1, MLH1, MSH2, MSH3, MSH6, MUTYH, NBN, NF1, NHTL1, PALB2, PDGFRA, PMS2, POLD1, POLE, PTEN, RAD50, RAD51C, RAD51D, RNF43, SDHB, SDHC, SDHD, SMAD4, SMARCA4. STK11, TP53, TSC1, TSC2, and VHL.  The following genes were evaluated for sequence changes only: SDHA and HOXB13 c.251G>A variant only. The test report has been scanned into EPIC and is located under the Molecular Pathology section of the  Results Review tab.  A portion of the result report is included below for reference.     We discussed with Sheila Hartman that because current genetic testing is not perfect, it is possible there may be a gene mutation in one of these genes that current testing cannot detect, but that chance is small.  We also discussed, that there could be another gene that has not yet been discovered, or that we have not yet tested, that is responsible for the cancer diagnoses in the family. It is also possible  there is a hereditary cause for the cancer in the family that Sheila Hartman did not inherit and therefore was not identified in Sheila Hartman testing.  Therefore, it is important to remain in touch with cancer genetics in the future so that we can continue to offer Sheila Hartman the most up to date genetic testing.   ADDITIONAL GENETIC TESTING: We discussed with Sheila Hartman that there are other genes that are associated with increased cancer risk that can be analyzed. Should Sheila Hartman wish to pursue additional genetic testing, we are happy to discuss and coordinate this testing, at any time.    CANCER SCREENING RECOMMENDATIONS: Sheila Hartman's test result is considered negative (normal).  This means that we have not identified a hereditary cause for Sheila Hartman family history of cancer at this time. Most cancers happen by chance and this negative test suggests that Sheila Hartman cancer may fall into this category.    While reassuring, this does not definitively rule out a hereditary predisposition to cancer. It is still possible that there could be genetic mutations that are undetectable by current technology. There could be genetic mutations in genes that have not been tested or identified to increase cancer risk.  Therefore, it is recommended she continue to follow the cancer management and screening guidelines provided by Sheila Hartman primary healthcare provider.   An individual's cancer risk and medical management are not determined by genetic test results alone. Overall cancer risk assessment incorporates additional factors, including personal medical history, family history, and any available genetic information that may result in a personalized plan for cancer prevention and surveillance.  RECOMMENDATIONS FOR FAMILY MEMBERS:  Individuals in this family might be at some increased risk of developing cancer, over the general population risk, simply due to the family history of cancer.  We recommended women in this family have a yearly mammogram  beginning at age 67, or 46 years younger than the earliest onset of cancer, an annual clinical breast exam, and perform monthly breast self-exams. Women in this family should also have a gynecological exam as recommended by their primary provider. All family members should have a colonoscopy by age 14.  FOLLOW-UP: Lastly, we discussed with Sheila Hartman that cancer genetics is a rapidly advancing field and it is possible that new genetic tests will be appropriate for Sheila Hartman and/or Sheila Hartman family members in the future. We encouraged Sheila Hartman to remain in contact with cancer genetics on an annual basis so we can update Sheila Hartman personal and family histories and let Sheila Hartman know of advances in cancer genetics that may benefit this family.   Our contact number was provided. Sheila Hartman's questions were answered to Sheila Hartman satisfaction, and she knows she is welcome to call us at anytime with additional questions or concerns.   Roma Kayser, [place], [facility] Licensed, Certified Genetic Counselor [email or web address]

## 2019-10-17 DIAGNOSIS — D3132 Benign neoplasm of left choroid: Secondary | ICD-10-CM | POA: Diagnosis not present

## 2019-10-17 DIAGNOSIS — H31092 Other chorioretinal scars, left eye: Secondary | ICD-10-CM | POA: Diagnosis not present

## 2019-10-17 DIAGNOSIS — E70319 Ocular albinism, unspecified: Secondary | ICD-10-CM | POA: Diagnosis not present

## 2019-10-17 DIAGNOSIS — H35412 Lattice degeneration of retina, left eye: Secondary | ICD-10-CM | POA: Diagnosis not present

## 2019-10-17 DIAGNOSIS — H2512 Age-related nuclear cataract, left eye: Secondary | ICD-10-CM | POA: Diagnosis not present

## 2019-11-27 ENCOUNTER — Other Ambulatory Visit: Payer: Self-pay | Admitting: Family Medicine

## 2019-12-10 ENCOUNTER — Other Ambulatory Visit: Payer: Self-pay

## 2019-12-10 ENCOUNTER — Encounter: Payer: Self-pay | Admitting: Podiatry

## 2019-12-10 ENCOUNTER — Ambulatory Visit: Payer: Medicare Other | Admitting: Podiatry

## 2019-12-10 VITALS — Temp 97.5°F

## 2019-12-10 DIAGNOSIS — B351 Tinea unguium: Secondary | ICD-10-CM | POA: Diagnosis not present

## 2019-12-10 DIAGNOSIS — M79674 Pain in right toe(s): Secondary | ICD-10-CM | POA: Diagnosis not present

## 2019-12-10 DIAGNOSIS — M79675 Pain in left toe(s): Secondary | ICD-10-CM

## 2019-12-10 NOTE — Patient Instructions (Signed)

## 2019-12-13 DIAGNOSIS — H43392 Other vitreous opacities, left eye: Secondary | ICD-10-CM | POA: Diagnosis not present

## 2019-12-13 DIAGNOSIS — Q141 Congenital malformation of retina: Secondary | ICD-10-CM | POA: Diagnosis not present

## 2019-12-13 DIAGNOSIS — H4312 Vitreous hemorrhage, left eye: Secondary | ICD-10-CM | POA: Diagnosis not present

## 2019-12-13 DIAGNOSIS — H35412 Lattice degeneration of retina, left eye: Secondary | ICD-10-CM | POA: Diagnosis not present

## 2019-12-13 DIAGNOSIS — H43812 Vitreous degeneration, left eye: Secondary | ICD-10-CM | POA: Diagnosis not present

## 2019-12-13 DIAGNOSIS — E70319 Ocular albinism, unspecified: Secondary | ICD-10-CM | POA: Diagnosis not present

## 2019-12-13 NOTE — Progress Notes (Signed)
Subjective: Sheila Hartman presents today for follow up of painful mycotic nails b/l that are difficult to trim. Pain interferes with ambulation. Aggravating factors include wearing enclosed shoe gear. Pain is relieved with periodic professional debridement.   Sheila Hartman relates no new pedal complaints on today's visit.   Allergies  Allergen Reactions  . Penicillins Hives    Objective: Vitals:   12/10/19 1118  Temp: (!) 97.5 F (36.4 C)    Pt 69 y.o. year old AA  female morbidly obese in NAD. AAO x 3.   Vascular Examination:  Capillary refill time to digits immediate b/l. Palpable DP pulses b/l. Palpable PT pulses b/l. Pedal hair sparse b/l. Skin temperature gradient within normal limits b/l.  Dermatological Examination: Pedal skin with normal turgor, texture and tone bilaterally. No open wounds bilaterally. Toenails 1-5 b/l elongated, dystrophic, thickened, crumbly with subungual debris and tenderness to dorsal palpation.  Musculoskeletal: Normal muscle strength 5/5 to all lower extremity muscle groups bilaterally, no pain crepitus or joint limitation noted with ROM b/l and bunion deformity noted b/l  Neurological: Protective sensation intact 5/5 intact bilaterally with 10g monofilament b/l Vibratory sensation intact b/l  Assessment: 1. Pain due to onychomycosis of toenails of both feet    Plan: -Toenails 1-5 b/l were debrided in length and girth with sterile nail nippers and dremel without iatrogenic bleeding.  -Patient to continue soft, supportive shoe gear daily. -Patient to report any pedal injuries to medical professional immediately. -Patient/POA to call should there be question/concern in the interim.  Return in about 3 months (around 03/11/2020) for nail trim.

## 2019-12-27 ENCOUNTER — Other Ambulatory Visit: Payer: Self-pay | Admitting: Family Medicine

## 2019-12-27 DIAGNOSIS — H35412 Lattice degeneration of retina, left eye: Secondary | ICD-10-CM | POA: Diagnosis not present

## 2019-12-27 DIAGNOSIS — H43392 Other vitreous opacities, left eye: Secondary | ICD-10-CM | POA: Diagnosis not present

## 2019-12-27 DIAGNOSIS — K219 Gastro-esophageal reflux disease without esophagitis: Secondary | ICD-10-CM

## 2019-12-27 DIAGNOSIS — H4312 Vitreous hemorrhage, left eye: Secondary | ICD-10-CM | POA: Diagnosis not present

## 2019-12-27 DIAGNOSIS — H35452 Secondary pigmentary degeneration, left eye: Secondary | ICD-10-CM | POA: Diagnosis not present

## 2019-12-27 DIAGNOSIS — H43812 Vitreous degeneration, left eye: Secondary | ICD-10-CM | POA: Diagnosis not present

## 2020-01-02 DIAGNOSIS — H52202 Unspecified astigmatism, left eye: Secondary | ICD-10-CM | POA: Diagnosis not present

## 2020-01-02 DIAGNOSIS — H31001 Unspecified chorioretinal scars, right eye: Secondary | ICD-10-CM | POA: Diagnosis not present

## 2020-01-02 DIAGNOSIS — H5212 Myopia, left eye: Secondary | ICD-10-CM | POA: Diagnosis not present

## 2020-02-05 ENCOUNTER — Other Ambulatory Visit: Payer: Self-pay | Admitting: Family Medicine

## 2020-02-05 DIAGNOSIS — Z1231 Encounter for screening mammogram for malignant neoplasm of breast: Secondary | ICD-10-CM

## 2020-02-07 DIAGNOSIS — M25512 Pain in left shoulder: Secondary | ICD-10-CM | POA: Diagnosis not present

## 2020-02-12 ENCOUNTER — Ambulatory Visit: Payer: Medicare Other | Admitting: Sports Medicine

## 2020-02-14 ENCOUNTER — Other Ambulatory Visit: Payer: Self-pay

## 2020-02-14 ENCOUNTER — Ambulatory Visit: Payer: Medicare Other | Admitting: Sports Medicine

## 2020-02-14 VITALS — BP 132/77 | Ht 69.0 in | Wt 280.0 lb

## 2020-02-14 DIAGNOSIS — M25512 Pain in left shoulder: Secondary | ICD-10-CM | POA: Diagnosis not present

## 2020-02-14 MED ORDER — METHYLPREDNISOLONE ACETATE 40 MG/ML IJ SUSP
40.0000 mg | Freq: Once | INTRAMUSCULAR | Status: AC
  Administered 2020-02-14: 40 mg via INTRA_ARTICULAR

## 2020-02-15 NOTE — Progress Notes (Signed)
   Subjective:    Patient ID: Sheila Hartman, female    DOB: 12/08/1950, 69 y.o.   MRN: [id number]  HPI chief complaint: Left shoulder pain  Patient is a very pleasant 69 year old female that comes in today complaining of 1 week of left shoulder pain.  She does not recall any specific injury but she has been more active.  She is in the process of moving and as a result has had to lift and push a lot of boxes and furniture.  Her pain is diffuse along the lateral left shoulder.  It is similar in nature to pain she had in the right shoulder in 2017.  She had a partial thickness rotator cuff tear in that shoulder diagnosed via ultrasound.  She improved with nitroglycerin and a subacromial cortisone injection.  Her left shoulder pain will radiate into the mid humerus but she denies radiating pain past the elbow.  No numbness or tingling.  She was seen at a local urgent care for this a few days ago.  Interim medical history reviewed Medications reviewed Allergies reviewed    Review of Systems    As above Objective:   Physical Exam  Well-developed, well-nourished.  No acute distress.  Awake alert and oriented x3.  Vital signs reviewed  Left shoulder: Patient does have good active and passive range of motion but has a positive painful arc.  Pain with internal and external rotation.  No tenderness to palpation.  Positive empty can, positive Hawkins.  Rotator cuff strength is 5/5 but reproducible pain with resisted supraspinatus.  Neurovascular intact distally.      Assessment & Plan:   Left shoulder pain likely secondary to subacromial bursitis versus rotator cuff tendinitis versus rotator cuff tendinopathy  Patient symptoms have been present now for a week and although they are improving she would like to proceed with a subacromial cortisone injection.  This was performed today atraumatically without complication.  If symptoms persist to return to the office for reevaluation to include  probable ultrasound of the left shoulder to rule out rotator cuff tear.  Follow-up for ongoing or recalcitrant issues.  Consent obtained and verified. Time-out conducted. Noted no overlying erythema, induration, or other signs of local infection. Skin prepped in a sterile fashion. Topical analgesic spray: Ethyl chloride. Joint: left shoulder, subacromial Needle: 25g 1.5 inch Completed without difficulty. Meds: 3cc 1% xylocaine, 1cc (40mg ) depomedrol  Advised to call if fevers/chills, erythema, induration, drainage, or persistent bleeding.

## 2020-02-20 DIAGNOSIS — H35452 Secondary pigmentary degeneration, left eye: Secondary | ICD-10-CM | POA: Diagnosis not present

## 2020-02-20 DIAGNOSIS — H35412 Lattice degeneration of retina, left eye: Secondary | ICD-10-CM | POA: Diagnosis not present

## 2020-02-20 DIAGNOSIS — Q141 Congenital malformation of retina: Secondary | ICD-10-CM | POA: Diagnosis not present

## 2020-02-20 DIAGNOSIS — H43812 Vitreous degeneration, left eye: Secondary | ICD-10-CM | POA: Diagnosis not present

## 2020-02-20 DIAGNOSIS — H43392 Other vitreous opacities, left eye: Secondary | ICD-10-CM | POA: Diagnosis not present

## 2020-03-03 ENCOUNTER — Other Ambulatory Visit: Payer: Self-pay

## 2020-03-04 ENCOUNTER — Ambulatory Visit (INDEPENDENT_AMBULATORY_CARE_PROVIDER_SITE_OTHER): Payer: Medicare Other | Admitting: Family Medicine

## 2020-03-04 ENCOUNTER — Encounter: Payer: Self-pay | Admitting: Family Medicine

## 2020-03-04 VITALS — BP 126/82 | HR 96 | Temp 97.9°F | Resp 16 | Ht 69.0 in | Wt 298.4 lb

## 2020-03-04 DIAGNOSIS — I10 Essential (primary) hypertension: Secondary | ICD-10-CM

## 2020-03-04 DIAGNOSIS — Z780 Asymptomatic menopausal state: Secondary | ICD-10-CM | POA: Diagnosis not present

## 2020-03-04 DIAGNOSIS — Z0000 Encounter for general adult medical examination without abnormal findings: Secondary | ICD-10-CM

## 2020-03-04 DIAGNOSIS — K219 Gastro-esophageal reflux disease without esophagitis: Secondary | ICD-10-CM | POA: Diagnosis not present

## 2020-03-04 DIAGNOSIS — F419 Anxiety disorder, unspecified: Secondary | ICD-10-CM

## 2020-03-04 DIAGNOSIS — Z23 Encounter for immunization: Secondary | ICD-10-CM | POA: Diagnosis not present

## 2020-03-04 DIAGNOSIS — Z6841 Body Mass Index (BMI) 40.0 and over, adult: Secondary | ICD-10-CM

## 2020-03-04 MED ORDER — OMEPRAZOLE 40 MG PO CPDR: DELAYED_RELEASE_CAPSULE | ORAL | 1 refills | Status: DC

## 2020-03-04 MED ORDER — AMLODIPINE BESY-BENAZEPRIL HCL 5-20 MG PO CAPS: 1.0000 | ORAL_CAPSULE | Freq: Every day | ORAL | 2 refills | Status: DC
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
## 2020-03-04 NOTE — Assessment & Plan Note (Signed)
Well controlled. Continue sertraline 25 mg daily.

## 2020-03-04 NOTE — Patient Instructions (Addendum)
A few things to remember from today's visit:    Sheila Hartman , Thank you for taking time to come for your Medicare Wellness Visit. I appreciate your ongoing commitment to your health goals. Please review the following plan we discussed and let me know if I can assist you in the future.   These are the goals we discussed: Goals    . Blood Pressure < 140/90    . DIET - REDUCE CALORIE INTAKE     Continue a healthful diet and regular physical activity. Fall precautions.    . Exercise 150 min/wk Moderate Activity     Started walking 30 minutes 3 days at the gym     . Increase water intake    . Weight (lb) < 284 lb (128.8 kg)     7% weight loss       This is a list of the screening recommended for you and due dates:  Health Maintenance  Topic Date Due  . Tetanus Vaccine  Never done  . DEXA scan (bone density measurement)  Never done  . Pneumonia vaccines (2 of 2 - PPSV23) 10/24/2019  . Flu Shot  04/20/2020  . Mammogram  04/24/2021  . Colon Cancer Screening  11/27/2021  . COVID-19 Vaccine  Completed  .  Hepatitis C: One time screening is recommended by Center for Disease Control  (CDC) for  adults born from [birth year] through 1963.   Completed   A few tips:  -As we age balance is not as good as it was, so there is a higher risks for falls. Please remove small rugs and furniture that is "in your way" and could increase the risk of falls. Stretching exercises may help with fall prevention: Yoga and Tai Chi are some examples. Low impact exercise is better, so you are not very achy the next day.  -Sun screen and avoidance of direct sun light recommended. Caution with dehydration, if working outdoors be sure to drink enough fluids.  - Some medications are not safe as we age, increases the risk of side effects and can potentially interact with other medication you are also taken;  including some of over the counter medications. Be sure to let me know when you start a new medication even if it  is a dietary/vitamin supplement.   -Healthy diet low in red meet/animal fat and sugar + regular physical activity is recommended.    If you need refills please call your pharmacy. Do not use My Chart to request refills or for acute issues that need immediate attention.    Please be sure medication list is accurate. If a new problem present, please set up appointment sooner than planned today.

## 2020-03-04 NOTE — Assessment & Plan Note (Signed)
BP adequately controlled. No changes in current management. Continue low-salt diet. Eye exam is current. 

## 2020-03-04 NOTE — Assessment & Plan Note (Signed)
We discussed benefits of weight loss. Encouraged to be consistent with recent dietary changes and continue regular physical activity as tolerated. Given her history of knee OA, low impact exercise is recommended.

## 2020-03-04 NOTE — Assessment & Plan Note (Signed)
Problem is well controlled. Continue omeprazole 40 mg daily and GERD precautions.

## 2020-03-05 ENCOUNTER — Other Ambulatory Visit (INDEPENDENT_AMBULATORY_CARE_PROVIDER_SITE_OTHER): Payer: Medicare Other

## 2020-03-05 DIAGNOSIS — I10 Essential (primary) hypertension: Secondary | ICD-10-CM | POA: Diagnosis not present

## 2020-03-05 LAB — BASIC METABOLIC PANEL
BUN: 22 mg/dL (ref 6–23)
CO2: 27 mEq/L (ref 19–32)
Calcium: 9.5 mg/dL (ref 8.4–10.5)
Chloride: 107 mEq/L (ref 96–112)
Creatinine, Ser: 0.88 mg/dL (ref 0.40–1.20)
GFR: 77.61 mL/min (ref >60.00)
Glucose, Bld: 92 mg/dL (ref 70–99)
Potassium: 4 mEq/L (ref 3.5–5.1)
Sodium: 140 mEq/L (ref 135–145)

## 2020-03-12 ENCOUNTER — Ambulatory Visit: Payer: Medicare Other | Admitting: Podiatry

## 2020-03-12 ENCOUNTER — Encounter: Payer: Self-pay | Admitting: Podiatry

## 2020-03-12 ENCOUNTER — Other Ambulatory Visit: Payer: Self-pay

## 2020-03-12 DIAGNOSIS — M79674 Pain in right toe(s): Secondary | ICD-10-CM

## 2020-03-12 DIAGNOSIS — M79675 Pain in left toe(s): Secondary | ICD-10-CM

## 2020-03-12 DIAGNOSIS — B351 Tinea unguium: Secondary | ICD-10-CM

## 2020-03-12 NOTE — Patient Instructions (Signed)
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
## 2020-04-25 ENCOUNTER — Other Ambulatory Visit: Payer: Self-pay

## 2020-04-25 ENCOUNTER — Ambulatory Visit
Admission: RE | Admit: 2020-04-25 | Discharge: 2020-04-25 | Disposition: A | Discharge status: Home / Self Care | Payer: Medicare Other | Source: Ambulatory Visit | Attending: Family Medicine | Admitting: Family Medicine

## 2020-04-25 DIAGNOSIS — Z1231 Encounter for screening mammogram for malignant neoplasm of breast: Secondary | ICD-10-CM | POA: Diagnosis not present

## 2020-05-23 ENCOUNTER — Telehealth: Payer: Self-pay | Admitting: Family Medicine

## 2020-05-23 NOTE — Telephone Encounter (Signed)
I called and left pt a voicemail. She is not due for a Tb injection.

## 2020-05-23 NOTE — Telephone Encounter (Signed)
Pt is wondering if she is due for a TB inj?    Pt can be reached at [phone]

## 2020-05-27 ENCOUNTER — Other Ambulatory Visit: Payer: Self-pay

## 2020-05-27 ENCOUNTER — Ambulatory Visit (INDEPENDENT_AMBULATORY_CARE_PROVIDER_SITE_OTHER): Payer: Medicare Other | Admitting: *Deleted

## 2020-05-27 DIAGNOSIS — Z111 Encounter for screening for respiratory tuberculosis: Secondary | ICD-10-CM

## 2020-05-27 NOTE — Progress Notes (Signed)
Patient in office for TB skin test  placement

## 2020-05-29 LAB — TB SKIN TEST
Induration: 0 mm
TB Skin Test: NEGATIVE

## 2020-05-29 NOTE — Progress Notes (Signed)
PPD Reading Note  PPD read and results entered in EpicCare.  Result: 0  mm induration.  Interpretation:Negative   If test not read within 48-72 hours of initial placement, patient advised to repeat in other arm 1-3 weeks after this test.  Allergic reaction:No

## 2020-05-30 ENCOUNTER — Ambulatory Visit
Admission: RE | Admit: 2020-05-30 | Discharge: 2020-05-30 | Disposition: A | Discharge status: Home / Self Care | Payer: Medicare Other | Source: Ambulatory Visit | Attending: Family Medicine | Admitting: Family Medicine

## 2020-05-30 ENCOUNTER — Other Ambulatory Visit: Payer: Self-pay

## 2020-05-30 DIAGNOSIS — Z780 Asymptomatic menopausal state: Secondary | ICD-10-CM

## 2020-06-25 ENCOUNTER — Ambulatory Visit: Payer: Medicare Other | Admitting: Podiatry

## 2020-06-25 ENCOUNTER — Other Ambulatory Visit: Payer: Self-pay

## 2020-06-25 ENCOUNTER — Encounter: Payer: Self-pay | Admitting: Podiatry

## 2020-06-25 DIAGNOSIS — B351 Tinea unguium: Secondary | ICD-10-CM

## 2020-06-25 DIAGNOSIS — M79674 Pain in right toe(s): Secondary | ICD-10-CM

## 2020-06-25 DIAGNOSIS — M79675 Pain in left toe(s): Secondary | ICD-10-CM
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
## 2020-07-02 ENCOUNTER — Other Ambulatory Visit: Payer: Self-pay

## 2020-07-02 ENCOUNTER — Ambulatory Visit (INDEPENDENT_AMBULATORY_CARE_PROVIDER_SITE_OTHER): Payer: Medicare Other | Admitting: Family Medicine

## 2020-07-02 ENCOUNTER — Encounter: Payer: Self-pay | Admitting: Family Medicine

## 2020-07-02 VITALS — BP 130/74 | HR 98 | Resp 16 | Ht 69.0 in | Wt 291.2 lb

## 2020-07-02 DIAGNOSIS — M5441 Lumbago with sciatica, right side: Secondary | ICD-10-CM

## 2020-07-02 DIAGNOSIS — Z23 Encounter for immunization: Secondary | ICD-10-CM | POA: Diagnosis not present

## 2020-07-02 DIAGNOSIS — M159 Polyosteoarthritis, unspecified: Secondary | ICD-10-CM | POA: Diagnosis not present

## 2020-07-02 DIAGNOSIS — F419 Anxiety disorder, unspecified: Secondary | ICD-10-CM

## 2020-07-02 DIAGNOSIS — I10 Essential (primary) hypertension: Secondary | ICD-10-CM

## 2020-07-02 DIAGNOSIS — G8929 Other chronic pain: Secondary | ICD-10-CM

## 2020-07-02 DIAGNOSIS — Z6841 Body Mass Index (BMI) 40.0 and over, adult: Secondary | ICD-10-CM

## 2020-07-02 MED ORDER — DIAZEPAM 5 MG PO TABS: ORAL_TABLET | ORAL | 0 refills | Status: AC

## 2020-07-02 MED ORDER — TIZANIDINE HCL 4 MG PO TABS: 2.0000 mg | ORAL_TABLET | Freq: Every evening | ORAL | 0 refills | Status: DC | PRN

## 2020-07-02 MED ORDER — DULOXETINE HCL 30 MG PO CPEP: 30.0000 mg | ORAL_CAPSULE | Freq: Every day | ORAL | 1 refills | Status: DC

## 2020-07-02 MED ORDER — SERTRALINE HCL 50 MG PO TABS: 25.0000 mg | ORAL_TABLET | Freq: Every day | ORAL | 0 refills | Status: DC

## 2020-07-02 NOTE — Assessment & Plan Note (Signed)
She has lost about 7-8 Lb. Encouraged to continue a healthful diet. Benefits of wt loss discussed. Due to OA and back pain exercise is challenging, low impact/aquatic exercise is an option.

## 2020-07-02 NOTE — Assessment & Plan Note (Signed)
Today Cymbalta 30 mg was started. Wt loss will also help. Tylenol 500 mg 4-5 tabs daily max.

## 2020-07-02 NOTE — Patient Instructions (Addendum)
A few things to remember from today's visit:   Essential hypertension  Anxiety disorder, unspecified type - Plan: sertraline (ZOLOFT) 50 MG tablet  Generalized osteoarthritis of multiple sites  Chronic bilateral low back pain with right-sided sciatica - Plan: MR Lumbar Spine Wo Contrast, Ambulatory referral to Orthopedic Surgery, tiZANidine (ZANAFLEX) 4 MG tablet  Today we decreased dose of Sertraline from 50 mg to 25 mg (1/2 tab) and added Cymbalta (duloxetine) 30 mg daily. In 7-10 days continue sertraline every other day for 7-10 days then every 3rd day for 4 times an then stop.  Lumbar MRI will be arranged as well as ortho evaluation. Zanaflex can cause drowsiness,so take it a bedtime.  Tylenol 500 mg can be taken 1-2 tab am and noon if needed and a tylenol pm x 1 at bedtime.  If you need refills please call your pharmacy. Do not use My Chart to request refills or for acute issues that need immediate attention.    Please be sure medication list is accurate. If a new problem present, please set up appointment sooner than planned today.

## 2020-07-02 NOTE — Assessment & Plan Note (Signed)
BP adequately controlled. Continue Amlodipine-Benazepril 5-20 mg daily. Low salt diet.

## 2020-07-02 NOTE — Assessment & Plan Note (Signed)
After discussing some side effects she agrees with trying Cymbalta 30 mg daily. Because pain is getting worse, lumbar MRI will be arranged. Ortho referral placed.
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
## 2020-07-02 NOTE — Assessment & Plan Note (Addendum)
Problem is well controlled. Because Cymbalta is going to be started today, Sertraline will be weaned off as tolerated. We discuses some side effects. Instructed about warning signs.  Valium 5 mg to take before lumbar MRI, 5-10 mg x 1.

## 2020-07-15 ENCOUNTER — Encounter: Payer: Self-pay | Admitting: Family Medicine

## 2020-07-15 ENCOUNTER — Ambulatory Visit (INDEPENDENT_AMBULATORY_CARE_PROVIDER_SITE_OTHER): Payer: Medicare Other | Admitting: Family Medicine

## 2020-07-15 ENCOUNTER — Ambulatory Visit: Payer: Medicare Other | Admitting: Family Medicine

## 2020-07-15 ENCOUNTER — Other Ambulatory Visit: Payer: Self-pay

## 2020-07-15 ENCOUNTER — Ambulatory Visit: Payer: Self-pay

## 2020-07-15 DIAGNOSIS — G8929 Other chronic pain: Secondary | ICD-10-CM | POA: Diagnosis not present

## 2020-07-15 DIAGNOSIS — M5441 Lumbago with sciatica, right side: Secondary | ICD-10-CM

## 2020-07-15 NOTE — Patient Instructions (Signed)
   Glucosamine Sulfate:  1,000 mg twice daily  Turmeric:  500 mg twice daily   
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
## 2020-07-28 ENCOUNTER — Other Ambulatory Visit: Payer: Self-pay | Admitting: Family Medicine

## 2020-07-28 DIAGNOSIS — G8929 Other chronic pain: Secondary | ICD-10-CM

## 2020-08-06 ENCOUNTER — Other Ambulatory Visit: Payer: Self-pay

## 2020-08-06 ENCOUNTER — Ambulatory Visit (INDEPENDENT_AMBULATORY_CARE_PROVIDER_SITE_OTHER): Payer: Medicare Other | Admitting: Family Medicine

## 2020-08-06 VITALS — BP 130/84 | HR 86 | Temp 97.7°F | Resp 16 | Ht 69.0 in | Wt 288.0 lb

## 2020-08-06 DIAGNOSIS — G8929 Other chronic pain: Secondary | ICD-10-CM

## 2020-08-06 DIAGNOSIS — F419 Anxiety disorder, unspecified: Secondary | ICD-10-CM

## 2020-08-06 DIAGNOSIS — M159 Polyosteoarthritis, unspecified: Secondary | ICD-10-CM

## 2020-08-06 DIAGNOSIS — M545 Low back pain, unspecified: Secondary | ICD-10-CM

## 2020-08-06 DIAGNOSIS — I10 Essential (primary) hypertension: Secondary | ICD-10-CM
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
## 2020-08-06 NOTE — Patient Instructions (Addendum)
A few things to remember from today's visit:   Hypertension, essential, benign  Generalized osteoarthritis of multiple sites  Chronic bilateral low back pain, unspecified whether sciatica present  If you need refills please call your pharmacy. Do not use My Chart to request refills or for acute issues that need immediate attention.   This are the instructions given last visit: Today we decreased dose of Sertraline from 50 mg to 25 mg (1/2 tab) and added Cymbalta (duloxetine) 30 mg daily. In 7-10 days continue sertraline every other day for 7-10 days then every 3rd day for 4 times an then stop.  Try PT at least once, they can give you handouts. Follow with ortho if not better in 4-5 weeks.  Zanaflex can cause drowsiness,so take it a bedtime.  Tylenol 500 mg can be taken 1-2 tab am and noon if needed and a tylenol pm x 1 at bedtime. Please be sure medication list is accurate. If a new problem present, please set up appointment sooner than planned today.

## 2020-08-09 ENCOUNTER — Encounter: Payer: Self-pay | Admitting: Family Medicine

## 2020-08-19 ENCOUNTER — Ambulatory Visit: Payer: Medicare Other | Admitting: Sports Medicine

## 2020-08-19 ENCOUNTER — Ambulatory Visit
Admission: RE | Admit: 2020-08-19 | Discharge: 2020-08-19 | Disposition: A | Discharge status: Home / Self Care | Payer: Medicare Other | Source: Ambulatory Visit | Attending: Sports Medicine | Admitting: Sports Medicine

## 2020-08-19 ENCOUNTER — Other Ambulatory Visit: Payer: Self-pay

## 2020-08-19 ENCOUNTER — Encounter: Payer: Self-pay | Admitting: Sports Medicine

## 2020-08-19 VITALS — BP 149/75 | Ht 69.0 in | Wt 280.0 lb

## 2020-08-19 DIAGNOSIS — M25512 Pain in left shoulder: Secondary | ICD-10-CM

## 2020-08-19 DIAGNOSIS — M19012 Primary osteoarthritis, left shoulder: Secondary | ICD-10-CM | POA: Diagnosis not present

## 2020-08-19 MED ORDER — METHYLPREDNISOLONE ACETATE 40 MG/ML IJ SUSP
40.0000 mg | Freq: Once | INTRAMUSCULAR | Status: AC
  Administered 2020-08-19: 40 mg via INTRA_ARTICULAR

## 2020-08-19 NOTE — Progress Notes (Addendum)
   Subjective:    Patient ID: Sheila Hartman, female    DOB: [birth year], 69 y.o.   MRN: [id number]  HPI chief complaint: Left shoulder pain  Patient comes in today complaining of left shoulder pain that began about 6 days ago without any known injury.  Pain is diffuse throughout the shoulder with radiation into the upper arm.  She had similar episodes in July that resolved with a cortisone injection.  She is also noticed limited range of motion.  No numbness or tingling.  Interim medical history reviewed Medications reviewed Allergies reviewed    Review of Systems    As above Objective:   Physical Exam  Well-developed, well-nourished.  Left shoulder: Patient has limited active and passive range of motion in all planes.  Trace crepitus felt with passive external rotation.  Positive empty can, positive Hawkins.  Mild weakness likely secondary to pain.  Neurovascular intact distally.      Assessment & Plan:   Returning left shoulder pain  Given her improvement in July with a simple subacromial cortisone injection, we will repeat that today.  I would like to get x-rays of the left shoulder including AP and axillary views.  Phone follow-up with those results when available.  In the meantime, patient will start pendulum exercises and wall walks to work on increasing her range of motion.  Consent obtained and verified. Time-out conducted. Noted no overlying erythema, induration, or other signs of local infection. Skin prepped in a sterile fashion. Topical analgesic spray: Ethyl chloride. Joint: left shoulder Needle: 25g 1.5 inch Completed without difficulty. Meds: 3cc 1% xylocaine, 1cc 40 mg Depomedrol  Advised to call if fevers/chills, erythema, induration, drainage, or persistent bleeding.

## 2020-08-19 NOTE — Addendum Note (Signed)
Addended by: Cyd Silence on: 08/19/2020 10:32 AM   Modules accepted: Orders

## 2020-08-20 ENCOUNTER — Other Ambulatory Visit: Payer: Self-pay | Admitting: Family Medicine

## 2020-08-20 DIAGNOSIS — F419 Anxiety disorder, unspecified: Secondary | ICD-10-CM

## 2020-08-25 ENCOUNTER — Telehealth: Payer: Self-pay | Admitting: Sports Medicine

## 2020-08-25 DIAGNOSIS — H35412 Lattice degeneration of retina, left eye: Secondary | ICD-10-CM | POA: Diagnosis not present

## 2020-08-25 DIAGNOSIS — H35452 Secondary pigmentary degeneration, left eye: Secondary | ICD-10-CM | POA: Diagnosis not present

## 2020-08-25 DIAGNOSIS — H43812 Vitreous degeneration, left eye: Secondary | ICD-10-CM | POA: Diagnosis not present

## 2020-08-25 DIAGNOSIS — E70319 Ocular albinism, unspecified: Secondary | ICD-10-CM | POA: Diagnosis not present

## 2020-08-25 DIAGNOSIS — H5500 Unspecified nystagmus: Secondary | ICD-10-CM | POA: Diagnosis not present

## 2020-08-25 NOTE — Telephone Encounter (Signed)
  I spoke with the patient on the phone today after reviewing x-rays of her left shoulder.  She has advanced glenohumeral DJD with several loose bodies.  Recent cortisone injection has helped with the pain.  We can repeat these periodically if needed.  Follow-up as needed.

## 2020-09-02 ENCOUNTER — Other Ambulatory Visit: Payer: Self-pay | Admitting: Family Medicine

## 2020-09-02 DIAGNOSIS — K219 Gastro-esophageal reflux disease without esophagitis: Secondary | ICD-10-CM

## 2020-09-09 ENCOUNTER — Other Ambulatory Visit: Payer: Self-pay

## 2020-09-09 ENCOUNTER — Inpatient Hospital Stay: Payer: Medicare Other | Attending: Hematology

## 2020-09-09 ENCOUNTER — Telehealth: Payer: Self-pay | Admitting: Hematology

## 2020-09-09 DIAGNOSIS — D509 Iron deficiency anemia, unspecified: Secondary | ICD-10-CM | POA: Diagnosis not present

## 2020-09-09 DIAGNOSIS — D472 Monoclonal gammopathy: Secondary | ICD-10-CM

## 2020-09-09 DIAGNOSIS — E538 Deficiency of other specified B group vitamins: Secondary | ICD-10-CM

## 2020-09-09 LAB — CMP (CANCER CENTER ONLY)
ALT: 19 U/L (ref 0–44)
AST: 19 U/L (ref 15–41)
Albumin: 3.9 g/dL (ref 3.5–5.0)
Alkaline Phosphatase: 103 U/L (ref 38–126)
Anion gap: 5 (ref 5–15)
BUN: 14 mg/dL (ref 8–23)
CO2: 29 mmol/L (ref 22–32)
Calcium: 9.5 mg/dL (ref 8.9–10.3)
Chloride: 111 mmol/L (ref 98–111)
Creatinine: 0.86 mg/dL (ref 0.44–1.00)
GFR, Estimated: >60 mL/min (ref >60)
Glucose, Bld: 93 mg/dL (ref 70–99)
Potassium: 3.8 mmol/L (ref 3.5–5.1)
Sodium: 145 mmol/L (ref 135–145)
Total Bilirubin: 0.3 mg/dL (ref 0.3–1.2)
Total Protein: 7.4 g/dL (ref 6.5–8.1)

## 2020-09-09 LAB — CBC WITH DIFFERENTIAL/PLATELET
Abs Immature Granulocytes: 0.01 10*3/uL (ref 0.00–0.07)
Basophils Absolute: 0 10*3/uL (ref 0.0–0.1)
Basophils Relative: 0 %
Eosinophils Absolute: 0.2 10*3/uL (ref 0.0–0.5)
Eosinophils Relative: 3 %
HCT: 41.9 % (ref 36.0–46.0)
Hemoglobin: 12.8 g/dL (ref 12.0–15.0)
Immature Granulocytes: 0 %
Lymphocytes Relative: 17 %
Lymphs Abs: 1 10*3/uL (ref 0.7–4.0)
MCH: 25 pg — ABNORMAL LOW (ref 26.0–34.0)
MCHC: 30.5 g/dL (ref 30.0–36.0)
MCV: 81.7 fL (ref 80.0–100.0)
Monocytes Absolute: 0.5 10*3/uL (ref 0.1–1.0)
Monocytes Relative: 9 %
Neutro Abs: 4 10*3/uL (ref 1.7–7.7)
Neutrophils Relative %: 71 %
Platelets: 238 10*3/uL (ref 150–400)
RBC: 5.13 MIL/uL — ABNORMAL HIGH (ref 3.87–5.11)
RDW: 15.5 % (ref 11.5–15.5)
WBC: 5.6 10*3/uL (ref 4.0–10.5)
nRBC: 0 % (ref 0.0–0.2)

## 2020-09-09 LAB — IRON AND TIBC
Iron: 79 ug/dL (ref 41–142)
Saturation Ratios: 20 % — ABNORMAL LOW (ref 21–57)
TIBC: 385 ug/dL (ref 236–444)
UIBC: 306 ug/dL (ref 120–384)

## 2020-09-09 LAB — FERRITIN: Ferritin: 21 ng/mL (ref 11–307)

## 2020-09-09 LAB — VITAMIN B12: Vitamin B-12: 267 pg/mL (ref 180–914)

## 2020-09-09 NOTE — Telephone Encounter (Signed)
Rescheduled lab appointment from 12/22 to 12/21 per patient's request. Providers nurse approved.

## 2020-09-10 ENCOUNTER — Inpatient Hospital Stay: Payer: Medicare Other | Admitting: Hematology

## 2020-09-10 ENCOUNTER — Inpatient Hospital Stay: Payer: Medicare Other

## 2020-09-11 LAB — MULTIPLE MYELOMA PANEL, SERUM
Albumin SerPl Elph-Mcnc: 3.8 g/dL (ref 2.9–4.4)
Albumin/Glob SerPl: 1.3 (ref 0.7–1.7)
Alpha 1: 0.3 g/dL (ref 0.0–0.4)
Alpha2 Glob SerPl Elph-Mcnc: 0.6 g/dL (ref 0.4–1.0)
B-Globulin SerPl Elph-Mcnc: 1.1 g/dL (ref 0.7–1.3)
Gamma Glob SerPl Elph-Mcnc: 1.2 g/dL (ref 0.4–1.8)
Globulin, Total: 3.1 g/dL (ref 2.2–3.9)
IgA: 121 mg/dL (ref 87–352)
IgG (Immunoglobin G), Serum: 1135 mg/dL (ref 586–1602)
IgM (Immunoglobulin M), Srm: 58 mg/dL (ref 26–217)
M Protein SerPl Elph-Mcnc: 0.6 g/dL — ABNORMAL HIGH
Total Protein ELP: 6.9 g/dL (ref 6.0–8.5)

## 2020-09-26 ENCOUNTER — Ambulatory Visit: Payer: Medicare Other | Admitting: Podiatry

## 2020-09-29 ENCOUNTER — Ambulatory Visit: Payer: Medicare Other | Admitting: Hematology

## 2020-09-29 DIAGNOSIS — Z1152 Encounter for screening for COVID-19: Secondary | ICD-10-CM | POA: Diagnosis not present

## 2020-10-01 ENCOUNTER — Telehealth: Payer: Self-pay | Admitting: Family Medicine

## 2020-10-01 ENCOUNTER — Encounter: Payer: Self-pay | Admitting: Family Medicine

## 2020-10-01 ENCOUNTER — Telehealth (INDEPENDENT_AMBULATORY_CARE_PROVIDER_SITE_OTHER): Payer: Medicare Other | Admitting: Family Medicine

## 2020-10-01 VITALS — Ht 69.0 in

## 2020-10-01 DIAGNOSIS — I10 Essential (primary) hypertension: Secondary | ICD-10-CM | POA: Diagnosis not present

## 2020-10-01 DIAGNOSIS — U071 COVID-19: Secondary | ICD-10-CM | POA: Diagnosis not present

## 2020-10-01 DIAGNOSIS — J029 Acute pharyngitis, unspecified: Secondary | ICD-10-CM

## 2020-10-01 MED ORDER — AMLODIPINE BESY-BENAZEPRIL HCL 5-20 MG PO CAPS: 1.0000 | ORAL_CAPSULE | Freq: Every day | ORAL | 2 refills | Status: DC

## 2020-10-01 MED ORDER — FLUTICASONE PROPIONATE 50 MCG/ACT NA SUSP: 1.0000 | Freq: Two times a day (BID) | NASAL | 1 refills | Status: DC
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
## 2020-10-01 NOTE — Telephone Encounter (Signed)
error 

## 2020-10-07 ENCOUNTER — Other Ambulatory Visit: Payer: Self-pay | Admitting: Family Medicine

## 2020-10-07 DIAGNOSIS — G8929 Other chronic pain: Secondary | ICD-10-CM

## 2020-10-07 DIAGNOSIS — M159 Polyosteoarthritis, unspecified: Secondary | ICD-10-CM

## 2020-10-07 DIAGNOSIS — F419 Anxiety disorder, unspecified: Secondary | ICD-10-CM

## 2020-10-14 ENCOUNTER — Encounter: Payer: Self-pay | Admitting: Family Medicine

## 2020-10-14 ENCOUNTER — Telehealth: Payer: Self-pay | Admitting: Family Medicine

## 2020-10-14 NOTE — Telephone Encounter (Signed)
Patient states she received her negative Covid test results today and she needs Dr. Martinique to write her a return to work note.   She will come in to pick up the note.  Advised to wait and hear from a nurse before coming in to make sure the note is ready.

## 2020-10-14 NOTE — Telephone Encounter (Signed)
I spoke with pt. Note is up front for pick up.

## 2020-10-23 ENCOUNTER — Other Ambulatory Visit: Payer: Self-pay | Admitting: Family Medicine
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
## 2020-11-06 ENCOUNTER — Inpatient Hospital Stay: Payer: Medicare Other | Attending: Hematology | Admitting: Hematology

## 2020-11-06 ENCOUNTER — Other Ambulatory Visit: Payer: Self-pay

## 2020-11-06 VITALS — BP 134/80 | HR 81 | Temp 98.0°F | Resp 19 | Ht 69.0 in | Wt 293.5 lb

## 2020-11-06 DIAGNOSIS — D472 Monoclonal gammopathy: Secondary | ICD-10-CM

## 2020-11-06 DIAGNOSIS — Z79899 Other long term (current) drug therapy: Secondary | ICD-10-CM | POA: Diagnosis not present

## 2020-11-06 DIAGNOSIS — D509 Iron deficiency anemia, unspecified: Secondary | ICD-10-CM

## 2020-11-07 ENCOUNTER — Other Ambulatory Visit: Payer: Self-pay

## 2020-11-07 MED ORDER — FLUTICASONE PROPIONATE 50 MCG/ACT NA SUSP: 1.0000 | Freq: Two times a day (BID) | NASAL | 1 refills | Status: DC

## 2020-11-28 ENCOUNTER — Other Ambulatory Visit: Payer: Self-pay | Admitting: Family Medicine

## 2020-11-28 ENCOUNTER — Encounter: Payer: Self-pay | Admitting: Podiatry

## 2020-11-28 ENCOUNTER — Ambulatory Visit: Payer: Medicare Other | Admitting: Podiatry

## 2020-11-28 ENCOUNTER — Other Ambulatory Visit: Payer: Self-pay

## 2020-11-28 DIAGNOSIS — G8929 Other chronic pain: Secondary | ICD-10-CM

## 2020-11-28 DIAGNOSIS — B351 Tinea unguium: Secondary | ICD-10-CM | POA: Diagnosis not present

## 2020-11-28 DIAGNOSIS — M159 Polyosteoarthritis, unspecified: Secondary | ICD-10-CM

## 2020-11-28 DIAGNOSIS — M79674 Pain in right toe(s): Secondary | ICD-10-CM | POA: Diagnosis not present

## 2020-11-28 DIAGNOSIS — F419 Anxiety disorder, unspecified: Secondary | ICD-10-CM

## 2020-11-28 DIAGNOSIS — M79675 Pain in left toe(s): Secondary | ICD-10-CM | POA: Diagnosis not present
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
## 2020-12-01 ENCOUNTER — Other Ambulatory Visit: Payer: Self-pay

## 2020-12-01 ENCOUNTER — Ambulatory Visit (INDEPENDENT_AMBULATORY_CARE_PROVIDER_SITE_OTHER): Payer: Medicare Other | Admitting: Family Medicine

## 2020-12-01 ENCOUNTER — Encounter: Payer: Self-pay | Admitting: Family Medicine

## 2020-12-01 VITALS — BP 130/80 | HR 92 | Resp 16 | Ht 69.0 in | Wt 292.0 lb

## 2020-12-01 DIAGNOSIS — I10 Essential (primary) hypertension: Secondary | ICD-10-CM | POA: Diagnosis not present

## 2020-12-01 DIAGNOSIS — G8929 Other chronic pain: Secondary | ICD-10-CM | POA: Diagnosis not present

## 2020-12-01 DIAGNOSIS — K219 Gastro-esophageal reflux disease without esophagitis: Secondary | ICD-10-CM | POA: Diagnosis not present

## 2020-12-01 DIAGNOSIS — M159 Polyosteoarthritis, unspecified: Secondary | ICD-10-CM | POA: Diagnosis not present

## 2020-12-01 DIAGNOSIS — M5441 Lumbago with sciatica, right side: Secondary | ICD-10-CM

## 2020-12-01 DIAGNOSIS — D509 Iron deficiency anemia, unspecified: Secondary | ICD-10-CM

## 2020-12-01 DIAGNOSIS — J029 Acute pharyngitis, unspecified: Secondary | ICD-10-CM | POA: Diagnosis not present

## 2020-12-01 DIAGNOSIS — F419 Anxiety disorder, unspecified: Secondary | ICD-10-CM

## 2020-12-01 MED ORDER — OMEPRAZOLE 20 MG PO CPDR
20.0000 mg | DELAYED_RELEASE_CAPSULE | Freq: Every day | ORAL | 3 refills | Status: DC
Start: 2020-12-01 — End: 2021-03-10

## 2020-12-01 MED ORDER — DULOXETINE HCL 60 MG PO CPEP: 60.0000 mg | ORAL_CAPSULE | Freq: Every day | ORAL | 0 refills | Status: DC

## 2020-12-01 NOTE — Patient Instructions (Addendum)
A few things to remember from today's visit:   Essential hypertension  Gastroesophageal reflux disease, unspecified whether esophagitis present  Generalized osteoarthritis of multiple sites - Plan: DULoxetine (CYMBALTA) 60 MG capsule  Chronic bilateral low back pain with right-sided sciatica - Plan: DULoxetine (CYMBALTA) 60 MG capsule  Sore throat  Next visit we will have fasting labs.  If you need refills please call your pharmacy. Do not use My Chart to request refills or for acute issues that need immediate attention.   Today Omeprazole dose decreased from 40 mg to 20 mg. Continue GERD precautions and monitor for cough or worsening sore throat. Duloxetine increased from 30 mg to 60 mg.  Please be sure medication list is accurate. If a new problem present, please set up appointment sooner than planned today.
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
## 2020-12-05 ENCOUNTER — Ambulatory Visit: Payer: Medicare Other | Admitting: Family Medicine

## 2020-12-13 ENCOUNTER — Other Ambulatory Visit: Payer: Self-pay | Admitting: Family Medicine

## 2021-02-03 ENCOUNTER — Ambulatory Visit: Payer: Medicare Other | Admitting: Sports Medicine

## 2021-02-03 ENCOUNTER — Other Ambulatory Visit: Payer: Self-pay

## 2021-02-03 ENCOUNTER — Ambulatory Visit
Admission: RE | Admit: 2021-02-03 | Discharge: 2021-02-03 | Disposition: A | Discharge status: Home / Self Care | Payer: Medicare Other | Source: Ambulatory Visit | Attending: Sports Medicine | Admitting: Sports Medicine

## 2021-02-03 VITALS — BP 134/73 | Ht 69.0 in | Wt 290.0 lb

## 2021-02-03 DIAGNOSIS — M25711 Osteophyte, right shoulder: Secondary | ICD-10-CM | POA: Diagnosis not present

## 2021-02-03 DIAGNOSIS — M19011 Primary osteoarthritis, right shoulder: Secondary | ICD-10-CM | POA: Diagnosis not present

## 2021-02-03 DIAGNOSIS — M25511 Pain in right shoulder: Secondary | ICD-10-CM

## 2021-02-03 NOTE — Progress Notes (Addendum)
    SUBJECTIVE:   CHIEF COMPLAINT / HPI:   Right Shoulder Pain Mrs Mealy is a very pleasant 70y/o female presenting today with right shoulder pain getting worse over the last 2-3 weeks. She has left shoulder pain which is chronic and stable and is also painful but her right shoulder is more bothersome. This occurred out of the blue without trauma or any known inciting event. The pain radiates to her biceps and sometimes to the back of the shoulder. Sleeping has become difficult. She takes Tylenol regular and Tylenol PM at night that helps temporarily. She is not sure if it gets worse with movement and activity. She denies any neck pain or numbness or tingling.  PERTINENT  PMH / PSH: HTN, HLD, Obesity, Hx of OA  OBJECTIVE:   BP 134/73   Ht 5\' 9"  (1.753 m)   Wt 290 lb (131.5 kg)   BMI 42.83 kg/m   No flowsheet data found.  Shoulder, Right: No evidence of bony deformity, asymmetry, or muscle atrophy; No tenderness over long head of biceps (bicipital groove). No TTP at [facility] joint. Limited active and passive range of motion especially with external rotation and with abduction, Thumb to T12 with significant tenderness. Strength 5/5 throughout. No abnormal scapular function observed. Sensation intact. Peripheral pulses intact. Special Tests:   - Crossarm test: NEG - Jobe test: NEG   - Hawkins: NEG   - Neer test: NEG   - Belly press test: NEG   - Drop arm test: NEG  ASSESSMENT/PLAN:   Right shoulder pain Given history, presentation, and exam she most likely has worsening OA in her right shoulder.  - X-rays 4 view of the right shoulder, we will call her after to discuss results. If confirms OA she will return for intraarticular injection - In the meantime, recommended Voltaren Gel and Salonpas pain patches  - Continue with Tynlenol     Nuala Alpha, DO PGY-4, Sports Medicine Fellow [place]  Patient seen and evaluated with the sports medicine fellow.  I agree  with the above plan of care.  X-rays of the right shoulder were reviewed.  She does have degenerative changes but not as severe as the left shoulder.  Treatment as above.  If symptoms persist, consider subacromial cortisone injection as she has had good relief with left shoulder pain in the past with this.  I also discussed possibility of further diagnostic imaging in the form of an MRI but she is not interested in any sort of surgery at this time.  Follow-up as needed.

## 2021-02-03 NOTE — Patient Instructions (Addendum)
It was great to meet you today! Thank you for letting me participate in your care!  Today, we discussed your right shoulder pain and I believe your pain is most likely due to worsening osteoarthritis in your right shoulder. I am getting x-rays to confirm. In the meantime, please continue using Tylenol and try Voltaren gel, Salonpas pain patches, and doing some range of motion exercises but don't do it to the point of pain. I will call you with the results of your x-ray and if those above mentioned medications don't help I will have you come in and I will inject your shoulder.  If you don't hear from [place] please follow up in 3-4 weeks.          Be well, Harolyn Rutherford, DO PGY-4, Sports Medicine Fellow [place]

## 2021-02-03 NOTE — Assessment & Plan Note (Signed)
Given history, presentation, and exam she most likely has worsening OA in her right shoulder.  - X-rays 4 view of the right shoulder, we will call her after to discuss results. If confirms OA she will return for intraarticular injection - In the meantime, recommended Voltaren Gel and Salonpas pain patches  - Continue with Tynlenol

## 2021-02-27 ENCOUNTER — Telehealth: Payer: Self-pay | Admitting: Family Medicine

## 2021-02-27 NOTE — Telephone Encounter (Signed)
Left message for patient to call back and schedule Medicare Annual Wellness Visit (AWV) either virtually or in office.   Last AWV 03/04/20 please schedule at anytime with [facility] Nurse Health Advisor 1 or 2   This should be a 45 minute visit.

## 2021-03-04 ENCOUNTER — Other Ambulatory Visit: Payer: Self-pay | Admitting: Family Medicine

## 2021-03-04 DIAGNOSIS — M159 Polyosteoarthritis, unspecified: Secondary | ICD-10-CM

## 2021-03-04 DIAGNOSIS — G8929 Other chronic pain: Secondary | ICD-10-CM

## 2021-03-10 ENCOUNTER — Other Ambulatory Visit: Payer: Self-pay | Admitting: Family Medicine

## 2021-03-10 ENCOUNTER — Ambulatory Visit (INDEPENDENT_AMBULATORY_CARE_PROVIDER_SITE_OTHER): Payer: Medicare Other

## 2021-03-10 DIAGNOSIS — Z0000 Encounter for general adult medical examination without abnormal findings: Secondary | ICD-10-CM | POA: Diagnosis not present

## 2021-03-10 DIAGNOSIS — K219 Gastro-esophageal reflux disease without esophagitis: Secondary | ICD-10-CM

## 2021-03-10 NOTE — Patient Instructions (Signed)
Sheila Hartman , Thank you for taking time to come for your Medicare Wellness Visit. I appreciate your ongoing commitment to your health goals. Please review the following plan we discussed and let me know if I can assist you in the future.   Screening recommendations/referrals: Colonoscopy: due 11/27/2021 Mammogram: due 04/25/2021 Bone Density: due 05/30/2022 Recommended yearly ophthalmology/optometry visit for glaucoma screening and checkup Recommended yearly dental visit for hygiene and checkup  Vaccinations: Influenza vaccine: due fall 2022 Pneumococcal vaccine: completed series  Tdap vaccine: due with injury  Shingles vaccine: will obtain local pharmacy    Advanced directives: will provide copies   Conditions/risks identified: none  Next appointment: 04/03/2021  @200pm  with Dr. Martinique    Preventive Care 2 Years and Older, Female Preventive care refers to lifestyle choices and visits with your health care provider that can promote health and wellness. What does preventive care include? A yearly physical exam. This is also called an annual well check. Dental exams once or twice a year. Routine eye exams. Ask your health care provider how often you should have your eyes checked. Personal lifestyle choices, including: Daily care of your teeth and gums. Regular physical activity. Eating a healthy diet. Avoiding tobacco and drug use. Limiting alcohol use. Practicing safe sex. Taking low-dose aspirin every day. Taking vitamin and mineral supplements as recommended by your health care provider. What happens during an annual well check? The services and screenings done by your health care provider during your annual well check will depend on your age, overall health, lifestyle risk factors, and family history of disease. Counseling  Your health care provider may ask you questions about your: Alcohol use. Tobacco use. Drug use. Emotional well-being. Home and relationship  well-being. Sexual activity. Eating habits. History of falls. Memory and ability to understand (cognition). Work and work Statistician. Reproductive health. Screening  You may have the following tests or measurements: Height, weight, and BMI. Blood pressure. Lipid and cholesterol levels. These may be checked every 5 years, or more frequently if you are over 44 years old. Skin check. Lung cancer screening. You may have this screening every year starting at age 37 if you have a 30-pack-year history of smoking and currently smoke or have quit within the past 15 years. Fecal occult blood test (FOBT) of the stool. You may have this test every year starting at age 11. Flexible sigmoidoscopy or colonoscopy. You may have a sigmoidoscopy every 5 years or a colonoscopy every 10 years starting at age 58. Hepatitis C blood test. Hepatitis B blood test. Sexually transmitted disease (STD) testing. Diabetes screening. This is done by checking your blood sugar (glucose) after you have not eaten for a while (fasting). You may have this done every 1-3 years. Bone density scan. This is done to screen for osteoporosis. You may have this done starting at age 24. Mammogram. This may be done every 1-2 years. Talk to your health care provider about how often you should have regular mammograms. Talk with your health care provider about your test results, treatment options, and if necessary, the need for more tests. Vaccines  Your health care provider may recommend certain vaccines, such as: Influenza vaccine. This is recommended every year. Tetanus, diphtheria, and acellular pertussis (Tdap, Td) vaccine. You may need a Td booster every 10 years. Zoster vaccine. You may need this after age 49. Pneumococcal 13-valent conjugate (PCV13) vaccine. One dose is recommended after age 20. Pneumococcal polysaccharide (PPSV23) vaccine. One dose is recommended after age 71. Talk  to your health care provider about which  screenings and vaccines you need and how often you need them. This information is not intended to replace advice given to you by your health care provider. Make sure you discuss any questions you have with your health care provider. Document Released: 10/03/2015 Document Revised: 05/26/2016 Document Reviewed: 07/08/2015 Elsevier Interactive Patient Education  2017 [place] Prevention in the Home Falls can cause injuries. They can happen to people of all ages. There are many things you can do to make your home safe and to help prevent falls. What can I do on the outside of my home? Regularly fix the edges of walkways and driveways and fix any cracks. Remove anything that might make you trip as you walk through a door, such as a raised step or threshold. Trim any bushes or trees on the path to your home. Use bright outdoor lighting. Clear any walking paths of anything that might make someone trip, such as rocks or tools. Regularly check to see if handrails are loose or broken. Make sure that both sides of any steps have handrails. Any raised decks and porches should have guardrails on the edges. Have any leaves, snow, or ice cleared regularly. Use sand or salt on walking paths during winter. Clean up any spills in your garage right away. This includes oil or grease spills. What can I do in the bathroom? Use night lights. Install grab bars by the toilet and in the tub and shower. Do not use towel bars as grab bars. Use non-skid mats or decals in the tub or shower. If you need to sit down in the shower, use a plastic, non-slip stool. Keep the floor dry. Clean up any water that spills on the floor as soon as it happens. Remove soap buildup in the tub or shower regularly. Attach bath mats securely with double-sided non-slip rug tape. Do not have throw rugs and other things on the floor that can make you trip. What can I do in the bedroom? Use night lights. Make sure that you have a  light by your bed that is easy to reach. Do not use any sheets or blankets that are too big for your bed. They should not hang down onto the floor. Have a firm chair that has side arms. You can use this for support while you get dressed. Do not have throw rugs and other things on the floor that can make you trip. What can I do in the kitchen? Clean up any spills right away. Avoid walking on wet floors. Keep items that you use a lot in easy-to-reach places. If you need to reach something above you, use a strong step stool that has a grab bar. Keep electrical cords out of the way. Do not use floor polish or wax that makes floors slippery. If you must use wax, use non-skid floor wax. Do not have throw rugs and other things on the floor that can make you trip. What can I do with my stairs? Do not leave any items on the stairs. Make sure that there are handrails on both sides of the stairs and use them. Fix handrails that are broken or loose. Make sure that handrails are as long as the stairways. Check any carpeting to make sure that it is firmly attached to the stairs. Fix any carpet that is loose or worn. Avoid having throw rugs at the top or bottom of the stairs. If you do have throw rugs,  attach them to the floor with carpet tape. Make sure that you have a light switch at the top of the stairs and the bottom of the stairs. If you do not have them, ask someone to add them for you. What else can I do to help prevent falls? Wear shoes that: Do not have high heels. Have rubber bottoms. Are comfortable and fit you well. Are closed at the toe. Do not wear sandals. If you use a stepladder: Make sure that it is fully opened. Do not climb a closed stepladder. Make sure that both sides of the stepladder are locked into place. Ask someone to hold it for you, if possible. Clearly mark and make sure that you can see: Any grab bars or handrails. First and last steps. Where the edge of each step  is. Use tools that help you move around (mobility aids) if they are needed. These include: Canes. Walkers. Scooters. Crutches. Turn on the lights when you go into a dark area. Replace any light bulbs as soon as they burn out. Set up your furniture so you have a clear path. Avoid moving your furniture around. If any of your floors are uneven, fix them. If there are any pets around you, be aware of where they are. Review your medicines with your doctor. Some medicines can make you feel dizzy. This can increase your chance of falling. Ask your doctor what other things that you can do to help prevent falls. This information is not intended to replace advice given to you by your health care provider. Make sure you discuss any questions you have with your health care provider. Document Released: 07/03/2009 Document Revised: 02/12/2016 Document Reviewed: 10/11/2014 Elsevier Interactive Patient Education  2017 [employer].
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
## 2021-03-13 ENCOUNTER — Ambulatory Visit: Payer: Medicare Other | Admitting: Podiatry

## 2021-03-13 ENCOUNTER — Encounter: Payer: Self-pay | Admitting: Podiatry

## 2021-03-13 ENCOUNTER — Other Ambulatory Visit: Payer: Self-pay

## 2021-03-13 DIAGNOSIS — M79674 Pain in right toe(s): Secondary | ICD-10-CM

## 2021-03-13 DIAGNOSIS — E70319 Ocular albinism, unspecified: Secondary | ICD-10-CM | POA: Diagnosis not present

## 2021-03-13 DIAGNOSIS — H43812 Vitreous degeneration, left eye: Secondary | ICD-10-CM | POA: Diagnosis not present

## 2021-03-13 DIAGNOSIS — M79675 Pain in left toe(s): Secondary | ICD-10-CM | POA: Diagnosis not present

## 2021-03-13 DIAGNOSIS — B351 Tinea unguium: Secondary | ICD-10-CM

## 2021-03-13 DIAGNOSIS — M779 Enthesopathy, unspecified: Secondary | ICD-10-CM

## 2021-03-13 DIAGNOSIS — H35412 Lattice degeneration of retina, left eye: Secondary | ICD-10-CM | POA: Diagnosis not present

## 2021-03-13 DIAGNOSIS — H31091 Other chorioretinal scars, right eye: Secondary | ICD-10-CM | POA: Diagnosis not present

## 2021-03-15 NOTE — Progress Notes (Signed)
  Subjective:  Patient ID: Sheila Hartman, female    DOB: [birth year],  MRN: [id number]  Sheila Hartman presents to clinic today for painful thick toenails that are difficult to trim. Pain interferes with ambulation. Aggravating factors include wearing enclosed shoe gear. Pain is relieved with periodic professional debridement.  Patient states she is having pain on the ball of her right foot. Denies any episodes of trauma. Has been present on and off for the past few weeks. She has not attempted any treatment for condition.  PCP is Martinique, Betty G, MD , and last visit was 12/01/2020.  Allergies  Allergen Reactions   Penicillins Hives    Review of Systems: Negative except as noted in the HPI. Objective:   Constitutional EVOLET SALMINEN is a pleasant 70 y.o. African American female, morbidly obese in NAD. AAO x 3.   Vascular Capillary refill time to digits immediate b/l. Palpable pedal pulses b/l LE. Pedal hair sparse. Lower extremity skin temperature gradient within normal limits. No pain with calf compression b/l. No cyanosis or clubbing noted.  Neurologic Normal speech. Oriented to person, place, and time. Protective sensation intact 5/5 intact bilaterally with 10g monofilament b/l. Vibratory sensation intact b/l.  Dermatologic Pedal skin with normal turgor, texture and tone b/l lower extremities No open wounds b/l lower extremities No interdigital macerations b/l lower extremities Toenails 1-5 b/l elongated, discolored, dystrophic, thickened, crumbly with subungual debris and tenderness to dorsal palpation.  Orthopedic: Normal muscle strength 5/5 to all lower extremity muscle groups bilaterally. HAV with bunion deformity right >left. No pain crepitus or joint limitation noted with ROM b/l. Hammertoe(s) noted to the R 2nd toe. POP plantar aspect right 2nd MPJ.   Radiographs: None Assessment:   1. Pain due to onychomycosis of toenails of both feet   2. Capsulitis    Plan:   Patient was evaluated and treated and all questions answered.  Onychomycosis with pain -Nails palliatively debridement as below -Educated on self-care  Procedure: Nail Debridement Rationale: Pain Type of Debridement: manual, sharp debridement. Instrumentation: Nail nipper, rotary burr. Number of Nails: 10 -Examined patient. -Patient to continue soft, supportive shoe gear daily. -Toenails 1-5 b/l were debrided in length and girth with sterile nail nippers and dremel without iatrogenic bleeding.  -Patient to report any pedal injuries to medical professional immediately. -Discussed capsulitis and treatment options of local steroid injection. Patient referred to Dr. Tana Felts for evaluation of capsulitis of right 2nd MPJ. -Patient/POA to call should there be question/concern in the interim.  Return in about 3 months (around 06/13/2021).  Marzetta Board, DPM

## 2021-03-19 ENCOUNTER — Other Ambulatory Visit: Payer: Self-pay | Admitting: Podiatry

## 2021-03-19 ENCOUNTER — Encounter: Payer: Self-pay | Admitting: Podiatry

## 2021-03-19 ENCOUNTER — Other Ambulatory Visit: Payer: Self-pay

## 2021-03-19 ENCOUNTER — Ambulatory Visit: Payer: Medicare Other | Admitting: Podiatry

## 2021-03-19 ENCOUNTER — Ambulatory Visit (INDEPENDENT_AMBULATORY_CARE_PROVIDER_SITE_OTHER): Payer: Medicare Other

## 2021-03-19 DIAGNOSIS — M778 Other enthesopathies, not elsewhere classified: Secondary | ICD-10-CM

## 2021-03-19 DIAGNOSIS — M79671 Pain in right foot: Secondary | ICD-10-CM

## 2021-03-19 MED ORDER — TRIAMCINOLONE ACETONIDE 10 MG/ML IJ SUSP
10.0000 mg | Freq: Once | INTRAMUSCULAR | Status: AC
  Administered 2021-03-19: 10 mg

## 2021-03-19 NOTE — Progress Notes (Signed)
Subjective:   Patient ID: Sheila Hartman, female   DOB: 70 y.o.   MRN: [id number]   HPI Patient presents for painful second metatarsal phalangeal joint right on referral stating its been very sore and also has bunion she was concerned about   ROS      Objective:  Physical Exam  Inflammatory capsulitis of the second MPJ right painful with fluid buildup within the joint and moderate structural bunion with redness around the first metatarsal head right     Assessment:  Inflammatory capsulitis second right along with bunion deformity     Plan:  H&P reviewed condition sterile prep anesthetized 60 milligrams Marcaine mixture forefoot right and digit block aspirated the second MPJ getting out a small amount of clear fluid injected quarter cc dexamethasone Kenalog advised on rigid bottom shoes reappoint as needed  X-rays indicate moderate structural bunion no indications of fracture or arthritis second MPJ

## 2021-04-03 ENCOUNTER — Encounter: Payer: Self-pay | Admitting: Family Medicine

## 2021-04-03 ENCOUNTER — Ambulatory Visit (INDEPENDENT_AMBULATORY_CARE_PROVIDER_SITE_OTHER): Payer: Medicare Other | Admitting: Family Medicine

## 2021-04-03 ENCOUNTER — Other Ambulatory Visit: Payer: Self-pay

## 2021-04-03 VITALS — BP 130/70 | HR 96 | Resp 16 | Ht 69.0 in | Wt 288.0 lb

## 2021-04-03 DIAGNOSIS — G8929 Other chronic pain: Secondary | ICD-10-CM | POA: Diagnosis not present

## 2021-04-03 DIAGNOSIS — M5441 Lumbago with sciatica, right side: Secondary | ICD-10-CM

## 2021-04-03 DIAGNOSIS — Z6841 Body Mass Index (BMI) 40.0 and over, adult: Secondary | ICD-10-CM

## 2021-04-03 DIAGNOSIS — E538 Deficiency of other specified B group vitamins: Secondary | ICD-10-CM | POA: Diagnosis not present

## 2021-04-03 DIAGNOSIS — M25511 Pain in right shoulder: Secondary | ICD-10-CM

## 2021-04-03 DIAGNOSIS — I10 Essential (primary) hypertension: Secondary | ICD-10-CM | POA: Diagnosis not present

## 2021-04-03 DIAGNOSIS — M159 Polyosteoarthritis, unspecified: Secondary | ICD-10-CM

## 2021-04-03 DIAGNOSIS — D472 Monoclonal gammopathy: Secondary | ICD-10-CM

## 2021-04-03 LAB — CBC WITH DIFFERENTIAL/PLATELET
Basophils Absolute: 0.1 10*3/uL (ref 0.0–0.1)
Basophils Relative: 0.7 % (ref 0.0–3.0)
Eosinophils Absolute: 0.1 10*3/uL (ref 0.0–0.7)
Eosinophils Relative: 0.8 % (ref 0.0–5.0)
HCT: 40.8 % (ref 36.0–46.0)
Hemoglobin: 13.1 g/dL (ref 12.0–15.0)
Lymphocytes Relative: 14 % (ref 12.0–46.0)
Lymphs Abs: 1 10*3/uL (ref 0.7–4.0)
MCHC: 32.1 g/dL (ref 30.0–36.0)
MCV: 80.6 fl (ref 78.0–100.0)
Monocytes Absolute: 0.5 10*3/uL (ref 0.1–1.0)
Monocytes Relative: 7.3 % (ref 3.0–12.0)
Neutro Abs: 5.6 10*3/uL (ref 1.4–7.7)
Neutrophils Relative %: 77.2 % — ABNORMAL HIGH (ref 43.0–77.0)
Platelets: 283 10*3/uL (ref 150.0–400.0)
RBC: 5.06 Mil/uL (ref 3.87–5.11)
RDW: 15.2 % (ref 11.5–15.5)
WBC: 7.2 10*3/uL (ref 4.0–10.5)

## 2021-04-03 LAB — COMPREHENSIVE METABOLIC PANEL
ALT: 15 U/L (ref 0–35)
AST: 17 U/L (ref 0–37)
Albumin: 4.4 g/dL (ref 3.5–5.2)
Alkaline Phosphatase: 100 U/L (ref 39–117)
BUN: 14 mg/dL (ref 6–23)
CO2: 27 mEq/L (ref 19–32)
Calcium: 10 mg/dL (ref 8.4–10.5)
Chloride: 108 mEq/L (ref 96–112)
Creatinine, Ser: 0.84 mg/dL (ref 0.40–1.20)
GFR: 71.68 mL/min (ref >60.00)
Glucose, Bld: 104 mg/dL — ABNORMAL HIGH (ref 70–99)
Potassium: 3.7 mEq/L (ref 3.5–5.1)
Sodium: 145 mEq/L (ref 135–145)
Total Bilirubin: 0.5 mg/dL (ref 0.2–1.2)
Total Protein: 7.1 g/dL (ref 6.0–8.3)

## 2021-04-03 LAB — VITAMIN B12: Vitamin B-12: 816 pg/mL (ref 211–911)

## 2021-04-03 MED ORDER — DULOXETINE HCL 60 MG PO CPEP: ORAL_CAPSULE | ORAL | 2 refills | Status: DC
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
## 2021-04-03 NOTE — Patient Instructions (Signed)
A few things to remember from today's visit:   Essential hypertension - Plan: Comprehensive metabolic panel  Generalized osteoarthritis of multiple sites - Plan: DULoxetine (CYMBALTA) 60 MG capsule  Chronic bilateral low back pain with right-sided sciatica - Plan: DULoxetine (CYMBALTA) 60 MG capsule  MGUS (monoclonal gammopathy of unknown significance) - Plan: Multiple Myeloma Panel (SPEP&IFE w/QIG), CBC with Differential/Platelet  Right shoulder pain, unspecified chronicity - Plan: Ambulatory referral to Physical Therapy  B12 deficiency - Plan: Vitamin B12  If you need refills please call your pharmacy. Do not use My Chart to request refills or for acute issues that need immediate attention.   No changes today. Continue the good work with your diet.  Please be sure medication list is accurate. If a new problem present, please set up appointment sooner than planned today.

## 2021-04-04 MED ORDER — AMLODIPINE BESY-BENAZEPRIL HCL 5-20 MG PO CAPS: 1.0000 | ORAL_CAPSULE | Freq: Every day | ORAL | 2 refills | Status: DC

## 2021-04-08 ENCOUNTER — Ambulatory Visit: Payer: Medicare Other | Attending: Family Medicine | Admitting: Physical Therapy

## 2021-04-08 ENCOUNTER — Encounter: Payer: Self-pay | Admitting: Physical Therapy

## 2021-04-08 ENCOUNTER — Other Ambulatory Visit: Payer: Self-pay

## 2021-04-08 DIAGNOSIS — M25511 Pain in right shoulder: Secondary | ICD-10-CM | POA: Insufficient documentation

## 2021-04-08 DIAGNOSIS — R252 Cramp and spasm: Secondary | ICD-10-CM | POA: Diagnosis not present

## 2021-04-08 DIAGNOSIS — M25611 Stiffness of right shoulder, not elsewhere classified: Secondary | ICD-10-CM | POA: Insufficient documentation

## 2021-04-08 DIAGNOSIS — R293 Abnormal posture: Secondary | ICD-10-CM | POA: Insufficient documentation

## 2021-04-08 DIAGNOSIS — R2689 Other abnormalities of gait and mobility: Secondary | ICD-10-CM | POA: Diagnosis not present

## 2021-04-08 DIAGNOSIS — G8929 Other chronic pain: Secondary | ICD-10-CM | POA: Insufficient documentation

## 2021-04-08 DIAGNOSIS — M6281 Muscle weakness (generalized): Secondary | ICD-10-CM | POA: Insufficient documentation

## 2021-04-09 LAB — MULTIPLE MYELOMA PANEL, SERUM
Albumin SerPl Elph-Mcnc: 4.1 g/dL (ref 2.9–4.4)
Albumin/Glob SerPl: 1.5 (ref 0.7–1.7)
Alpha 1: 0.2 g/dL (ref 0.0–0.4)
Alpha2 Glob SerPl Elph-Mcnc: 0.6 g/dL (ref 0.4–1.0)
B-Globulin SerPl Elph-Mcnc: 1 g/dL (ref 0.7–1.3)
Gamma Glob SerPl Elph-Mcnc: 1.1 g/dL (ref 0.4–1.8)
Globulin, Total: 2.9 g/dL (ref 2.2–3.9)
IgA/Immunoglobulin A, Serum: 108 mg/dL (ref 87–352)
IgG (Immunoglobin G), Serum: 1210 mg/dL (ref 586–1602)
IgM (Immunoglobulin M), Srm: 54 mg/dL (ref 26–217)
M Protein SerPl Elph-Mcnc: 0.4 g/dL — ABNORMAL HIGH
Total Protein: 7 g/dL (ref 6.0–8.5)
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
## 2021-04-09 NOTE — Patient Instructions (Signed)
Access Code: [email or web address] URL: [email or web address] Date: 04/09/2021 Prepared by: Everardo All  Exercises Shoulder Flexion Wall Slide with Towel - 1 x daily - 7 x weekly - 1 sets - 20 reps Isometric Shoulder Flexion at Wall - 1 x daily - 7 x weekly - 2 sets - 10 reps - 5s hold Isometric Shoulder Abduction at Wall - 1 x daily - 7 x weekly - 2 sets - 10 reps - 5s hold Wall Push Up - 1 x daily - 7 x weekly - 2 sets - 10 reps

## 2021-04-16 ENCOUNTER — Ambulatory Visit: Payer: Medicare Other | Admitting: Physical Therapy

## 2021-04-16 ENCOUNTER — Other Ambulatory Visit: Payer: Self-pay

## 2021-04-16 DIAGNOSIS — M6281 Muscle weakness (generalized): Secondary | ICD-10-CM

## 2021-04-16 DIAGNOSIS — R2689 Other abnormalities of gait and mobility: Secondary | ICD-10-CM | POA: Diagnosis not present

## 2021-04-16 DIAGNOSIS — R293 Abnormal posture: Secondary | ICD-10-CM

## 2021-04-16 DIAGNOSIS — M25611 Stiffness of right shoulder, not elsewhere classified: Secondary | ICD-10-CM | POA: Diagnosis not present

## 2021-04-16 DIAGNOSIS — G8929 Other chronic pain: Secondary | ICD-10-CM | POA: Diagnosis not present

## 2021-04-16 DIAGNOSIS — M25511 Pain in right shoulder: Secondary | ICD-10-CM | POA: Diagnosis not present

## 2021-04-16 DIAGNOSIS — R252 Cramp and spasm: Secondary | ICD-10-CM | POA: Diagnosis not present
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
## 2021-04-23 ENCOUNTER — Ambulatory Visit: Payer: Medicare Other | Attending: Family Medicine | Admitting: Physical Therapy

## 2021-04-23 ENCOUNTER — Other Ambulatory Visit: Payer: Self-pay

## 2021-04-23 DIAGNOSIS — R252 Cramp and spasm: Secondary | ICD-10-CM | POA: Diagnosis not present

## 2021-04-23 DIAGNOSIS — R293 Abnormal posture: Secondary | ICD-10-CM | POA: Insufficient documentation

## 2021-04-23 DIAGNOSIS — R2689 Other abnormalities of gait and mobility: Secondary | ICD-10-CM | POA: Diagnosis not present

## 2021-04-23 DIAGNOSIS — M6281 Muscle weakness (generalized): Secondary | ICD-10-CM | POA: Insufficient documentation

## 2021-04-23 DIAGNOSIS — M25511 Pain in right shoulder: Secondary | ICD-10-CM | POA: Diagnosis not present

## 2021-04-23 DIAGNOSIS — M25611 Stiffness of right shoulder, not elsewhere classified: Secondary | ICD-10-CM | POA: Diagnosis not present

## 2021-04-23 DIAGNOSIS — G8929 Other chronic pain: Secondary | ICD-10-CM | POA: Insufficient documentation
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
## 2021-04-30 ENCOUNTER — Encounter: Payer: Medicare Other | Admitting: Physical Therapy

## 2021-05-07 ENCOUNTER — Encounter: Payer: Medicare Other | Admitting: Physical Therapy

## 2021-05-14 ENCOUNTER — Ambulatory Visit: Payer: Medicare Other | Admitting: Physical Therapy

## 2021-05-14 ENCOUNTER — Other Ambulatory Visit: Payer: Self-pay

## 2021-05-14 DIAGNOSIS — M6281 Muscle weakness (generalized): Secondary | ICD-10-CM | POA: Diagnosis not present

## 2021-05-14 DIAGNOSIS — M25611 Stiffness of right shoulder, not elsewhere classified: Secondary | ICD-10-CM | POA: Diagnosis not present

## 2021-05-14 DIAGNOSIS — R293 Abnormal posture: Secondary | ICD-10-CM

## 2021-05-14 DIAGNOSIS — G8929 Other chronic pain: Secondary | ICD-10-CM

## 2021-05-14 DIAGNOSIS — M25511 Pain in right shoulder: Secondary | ICD-10-CM | POA: Diagnosis not present

## 2021-05-14 DIAGNOSIS — R252 Cramp and spasm: Secondary | ICD-10-CM

## 2021-05-14 DIAGNOSIS — R2689 Other abnormalities of gait and mobility: Secondary | ICD-10-CM | POA: Diagnosis not present
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
## 2021-05-21 ENCOUNTER — Other Ambulatory Visit: Payer: Self-pay

## 2021-05-21 ENCOUNTER — Encounter: Payer: Self-pay | Admitting: Physical Therapy

## 2021-05-21 ENCOUNTER — Ambulatory Visit: Payer: Medicare Other | Attending: Family Medicine | Admitting: Physical Therapy

## 2021-05-21 DIAGNOSIS — M6281 Muscle weakness (generalized): Secondary | ICD-10-CM | POA: Diagnosis not present

## 2021-05-21 DIAGNOSIS — R293 Abnormal posture: Secondary | ICD-10-CM | POA: Insufficient documentation
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
## 2021-06-15 ENCOUNTER — Ambulatory Visit: Payer: Medicare Other | Admitting: Podiatry

## 2021-06-18 ENCOUNTER — Other Ambulatory Visit: Payer: Self-pay | Admitting: Family Medicine

## 2021-06-18 DIAGNOSIS — Z1231 Encounter for screening mammogram for malignant neoplasm of breast: Secondary | ICD-10-CM

## 2021-06-19 ENCOUNTER — Other Ambulatory Visit: Payer: Self-pay

## 2021-06-19 ENCOUNTER — Ambulatory Visit
Admission: RE | Admit: 2021-06-19 | Discharge: 2021-06-19 | Disposition: A | Discharge status: Home / Self Care | Payer: Medicare Other | Source: Ambulatory Visit | Attending: Family Medicine | Admitting: Family Medicine

## 2021-06-19 DIAGNOSIS — Z1231 Encounter for screening mammogram for malignant neoplasm of breast: Secondary | ICD-10-CM | POA: Diagnosis not present

## 2021-08-25 ENCOUNTER — Ambulatory Visit: Payer: Medicare Other | Admitting: Podiatry

## 2021-08-25 ENCOUNTER — Other Ambulatory Visit: Payer: Self-pay

## 2021-08-25 DIAGNOSIS — B351 Tinea unguium: Secondary | ICD-10-CM | POA: Diagnosis not present

## 2021-08-25 DIAGNOSIS — M79674 Pain in right toe(s): Secondary | ICD-10-CM

## 2021-08-25 DIAGNOSIS — M79675 Pain in left toe(s): Secondary | ICD-10-CM

## 2021-09-01 ENCOUNTER — Encounter: Payer: Self-pay | Admitting: Podiatry

## 2021-09-01 NOTE — Progress Notes (Signed)
Subjective: Sheila Hartman is a 70 y.o. female patient seen today for follow up of  painful thick toenails that are difficult to trim. Pain interferes with ambulation. Aggravating factors include wearing enclosed shoe gear. Pain is relieved with periodic professional debridement.  New problems reported today: None.  PCP is Martinique, Betty G, MD. Last visit was: 04/03/2021.  Allergies  Allergen Reactions   Penicillins Hives    Objective: Physical Exam  General: Patient is a pleasant 70 y.o. African American female morbidly obese in NAD. AAO x 3.   Neurovascular Examination: CFT immediate b/l LE. Palpable DP/PT pulses b/l LE. Digital hair sparse b/l. Skin temperature gradient WNL b/l. No pain with calf compression b/l. No edema noted b/l. No cyanosis or clubbing noted b/l LE.  Protective sensation intact 5/5 intact bilaterally with 10g monofilament b/l. Vibratory sensation intact b/l.  Dermatological:  Pedal integument with normal turgor, texture and tone b/l LE. No open wounds b/l. No interdigital macerations b/l. Toenails 1-5 b/l elongated, thickened, discolored with subungual debris. +Tenderness with dorsal palpation of nailplates. No hyperkeratotic or porokeratotic lesions present.  Musculoskeletal:  Normal muscle strength 5/5 to all lower extremity muscle groups bilaterally. HAV with bunion deformity noted b/l LE. Hammertoe(s) noted to the R 2nd toe.Marland Kitchen No pain, crepitus or joint limitation noted with ROM b/l LE.  Patient ambulates independently without assistive aids.  Assessment: 1. Pain due to onychomycosis of toenails of both feet    Plan: Patient was evaluated and treated and all questions answered. Consent given for treatment as described below: -Patient to continue soft, supportive shoe gear daily. -Mycotic toenails 1-5 bilaterally were debrided in length and girth with sterile nail nippers and dremel without incident. -Patient/POA to call should there be  question/concern in the interim.  Return in about 9 weeks (around 10/27/2021).  Marzetta Board, DPM

## 2021-09-23 DIAGNOSIS — H5711 Ocular pain, right eye: Secondary | ICD-10-CM | POA: Diagnosis not present

## 2021-09-23 DIAGNOSIS — H43812 Vitreous degeneration, left eye: Secondary | ICD-10-CM | POA: Diagnosis not present

## 2021-09-23 DIAGNOSIS — H31091 Other chorioretinal scars, right eye: Secondary | ICD-10-CM | POA: Diagnosis not present

## 2021-09-23 DIAGNOSIS — E70319 Ocular albinism, unspecified: Secondary | ICD-10-CM | POA: Diagnosis not present

## 2021-09-23 DIAGNOSIS — H35412 Lattice degeneration of retina, left eye: Secondary | ICD-10-CM | POA: Diagnosis not present

## 2021-09-23 DIAGNOSIS — H3530 Unspecified macular degeneration: Secondary | ICD-10-CM | POA: Diagnosis not present

## 2021-10-24 IMAGING — MG DIGITAL SCREENING BILAT W/ TOMO W/ CAD
8 of 15 series · 8 of 40 positions shown · non-contrast
Comparison: Previous exam(s).

ACR Breast Density Category a: The breast tissue is almost entirely
fatty.

CLINICAL DATA: Screening.

EXAM:
DIGITAL SCREENING BILATERAL MAMMOGRAM WITH TOMO AND CAD

[L CC synth-2D]
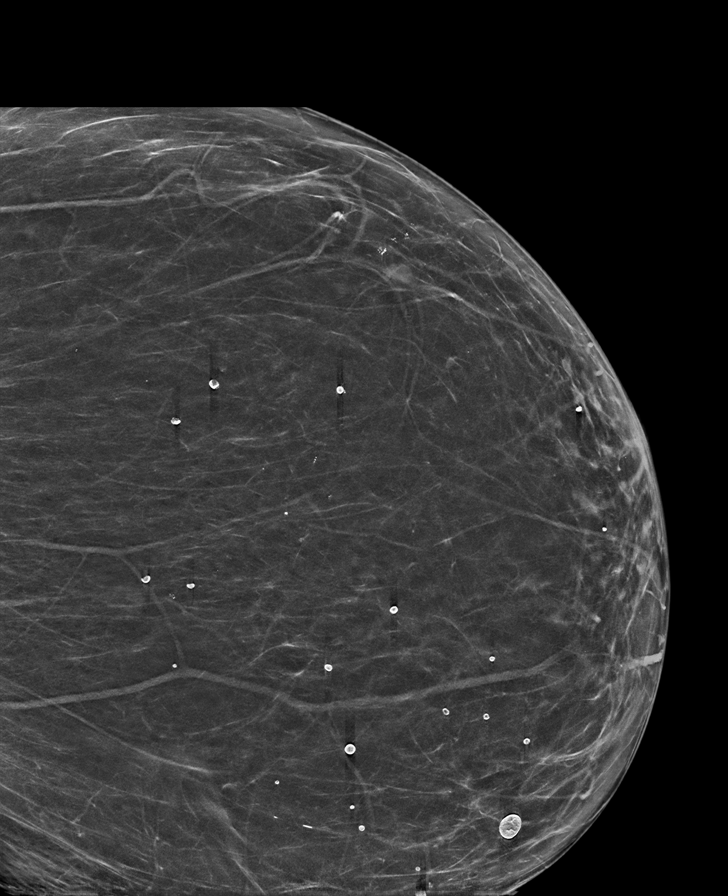

[R CC synth-2D]
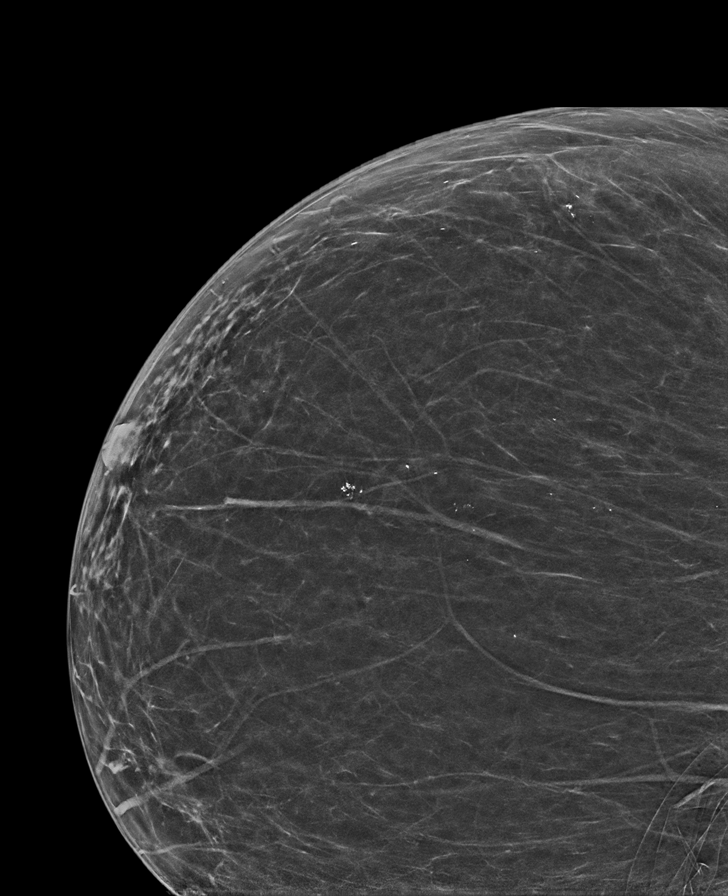

[L MLO synth-2D (1 of 2)]
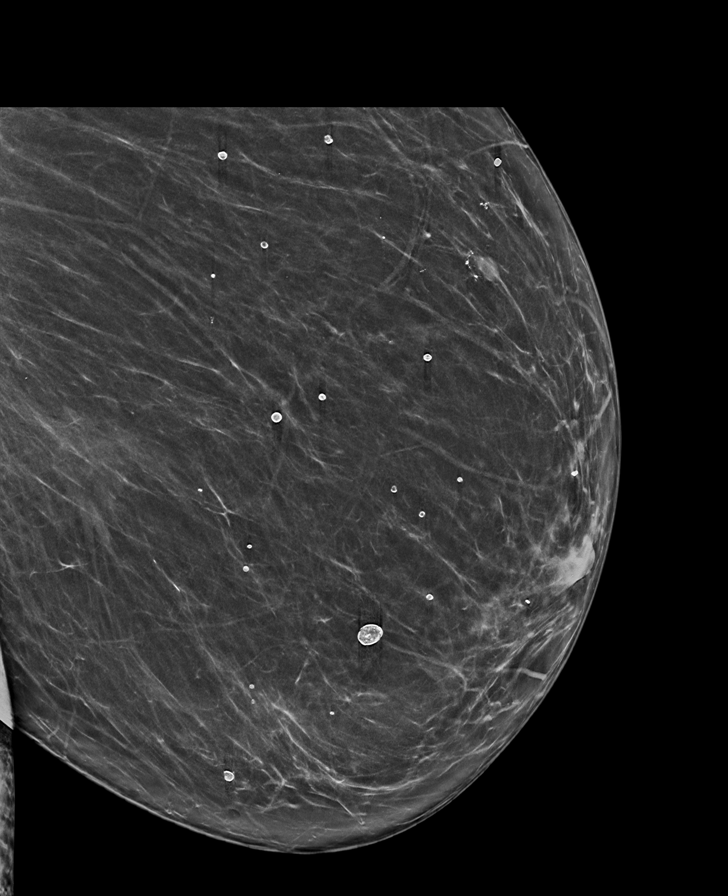

[L MLO synth-2D (2 of 2)]
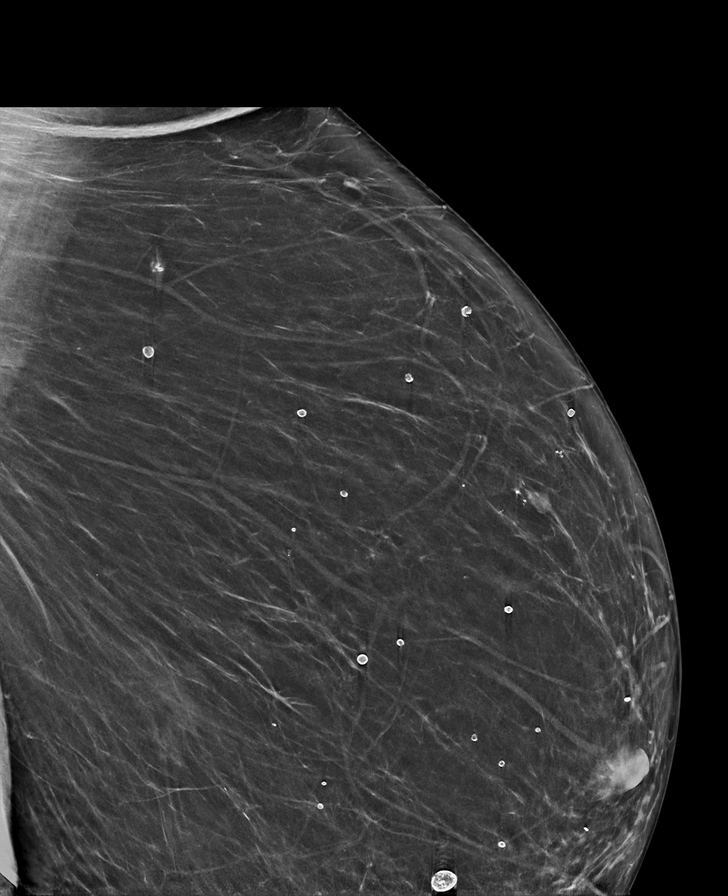

[R CV synth-2D]
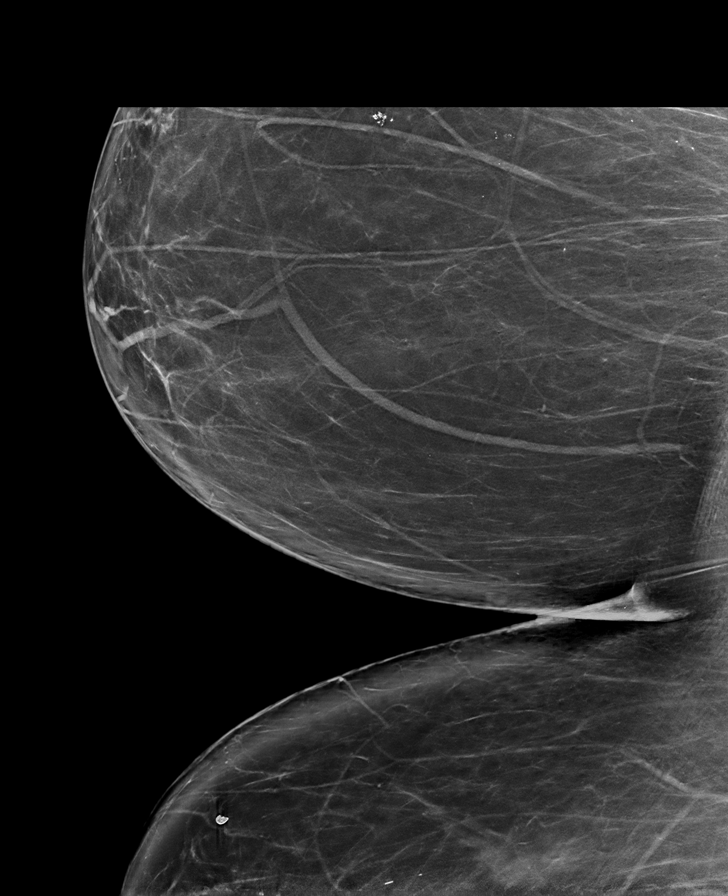

[R MLO synth-2D (1 of 2)]
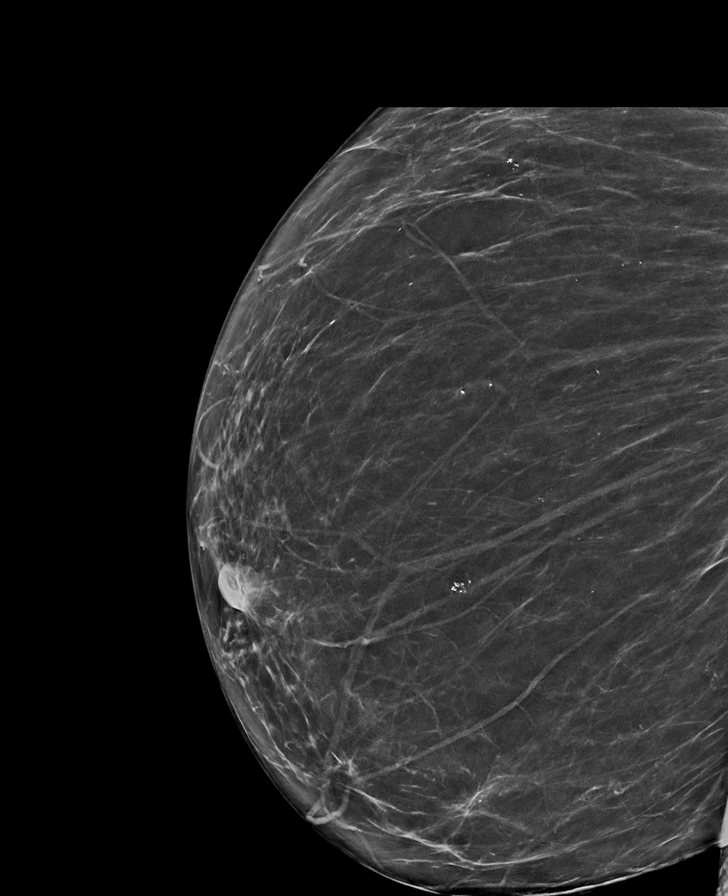

[R MLO synth-2D (2 of 2)]
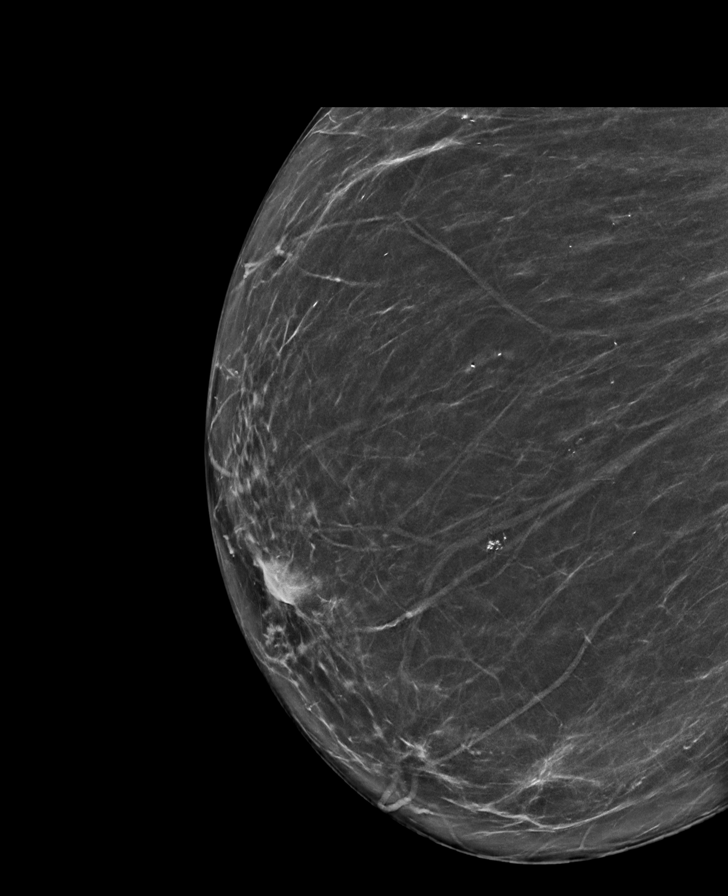

[L MLO tomo · tomo slice 62/90.0]
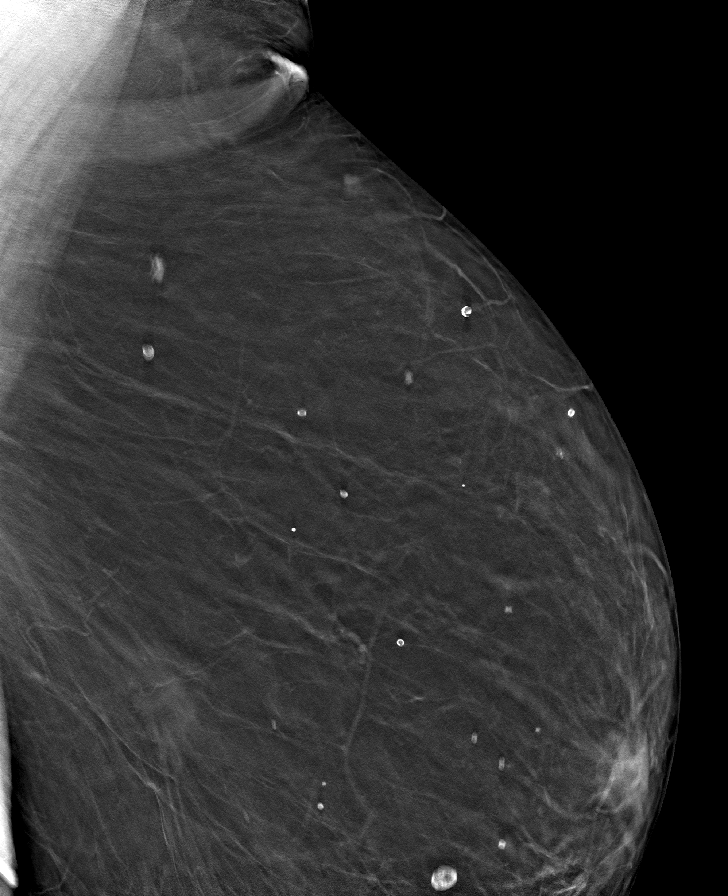

[8 of 40 positions shown; findings below may reference images not displayed]

FINDINGS: There are no findings suspicious for malignancy. Images were
processed with CAD.
IMPRESSION: No mammographic evidence of malignancy. A result letter of this
screening mammogram will be mailed directly to the patient.

RECOMMENDATION:
Screening mammogram in one year. (Code:8Y-Q-VVS)

BI-RADS CATEGORY  1: Negative.

## 2021-10-27 ENCOUNTER — Ambulatory Visit: Payer: Medicare Other | Admitting: Podiatry

## 2021-10-27 ENCOUNTER — Encounter: Payer: Self-pay | Admitting: Podiatry

## 2021-10-27 ENCOUNTER — Other Ambulatory Visit: Payer: Self-pay

## 2021-10-27 DIAGNOSIS — M79675 Pain in left toe(s): Secondary | ICD-10-CM

## 2021-10-27 DIAGNOSIS — M79674 Pain in right toe(s): Secondary | ICD-10-CM | POA: Diagnosis not present

## 2021-10-27 DIAGNOSIS — B351 Tinea unguium: Secondary | ICD-10-CM | POA: Diagnosis not present

## 2021-10-27 DIAGNOSIS — M79671 Pain in right foot: Secondary | ICD-10-CM

## 2021-10-27 NOTE — Progress Notes (Signed)
Subjective: Sheila Hartman is a 71 y.o. female patient seen today for follow up of  painful thick toenails that are difficult to trim. Pain interferes with ambulation. Aggravating factors include wearing enclosed shoe gear. Pain is relieved with periodic professional debridement.  New problems reported today: None.  PCP is Martinique, Betty G, MD. Last visit was: 04/03/2021.  Allergies  Allergen Reactions   Penicillins Hives    Objective: Physical Exam  General: Patient is a pleasant 71 y.o. African American female morbidly obese in NAD. AAO x 3.   Neurovascular Examination: CFT immediate b/l LE. Palpable DP/PT pulses b/l LE. Digital hair sparse b/l. Skin temperature gradient WNL b/l. No pain with calf compression b/l. No edema noted b/l. No cyanosis or clubbing noted b/l LE.  Protective sensation intact 5/5 intact bilaterally with 10g monofilament b/l. Vibratory sensation intact b/l.  Dermatological:  Pedal integument with normal turgor, texture and tone b/l LE. No open wounds b/l. No interdigital macerations b/l. Toenails 1-5 b/l elongated, thickened, discolored with subungual debris. +Tenderness with dorsal palpation of nailplates. No hyperkeratotic or porokeratotic lesions present.  Musculoskeletal:  Normal muscle strength 5/5 to all lower extremity muscle groups bilaterally. HAV with bunion deformity noted b/l LE. Hammertoe(s) noted to the R 2nd toe.Marland Kitchen No pain, crepitus or joint limitation noted with ROM b/l LE.  Patient ambulates independently without assistive aids.  Assessment: No diagnosis found.  Plan: Patient was evaluated and treated and all questions answered. Consent given for treatment as described below: -Patient to continue soft, supportive shoe gear daily. -Mycotic toenails 1-5 bilaterally were debrided in length and girth with sterile nail nippers and dremel without incident. -Patient/POA to call should there be question/concern in the interim.  No follow-ups  on file.  Lorenda Peck, MD

## 2021-10-31 ENCOUNTER — Other Ambulatory Visit: Payer: Self-pay | Admitting: Family Medicine

## 2021-10-31 DIAGNOSIS — K219 Gastro-esophageal reflux disease without esophagitis: Secondary | ICD-10-CM

## 2021-11-06 ENCOUNTER — Other Ambulatory Visit: Payer: Self-pay

## 2021-11-06 DIAGNOSIS — G8929 Other chronic pain: Secondary | ICD-10-CM

## 2021-11-06 DIAGNOSIS — K219 Gastro-esophageal reflux disease without esophagitis: Secondary | ICD-10-CM

## 2021-11-06 DIAGNOSIS — I10 Essential (primary) hypertension: Secondary | ICD-10-CM

## 2021-11-06 DIAGNOSIS — M159 Polyosteoarthritis, unspecified: Secondary | ICD-10-CM

## 2021-11-06 MED ORDER — DULOXETINE HCL 60 MG PO CPEP: ORAL_CAPSULE | ORAL | 1 refills | Status: DC

## 2021-11-06 MED ORDER — OMEPRAZOLE 20 MG PO CPDR: DELAYED_RELEASE_CAPSULE | ORAL | 1 refills | Status: DC

## 2021-11-06 MED ORDER — AMLODIPINE BESY-BENAZEPRIL HCL 5-20 MG PO CAPS: 1.0000 | ORAL_CAPSULE | Freq: Every day | ORAL | 1 refills | Status: DC

## 2021-11-12 ENCOUNTER — Telehealth: Payer: Self-pay | Admitting: Family Medicine

## 2021-11-12 DIAGNOSIS — I10 Essential (primary) hypertension: Secondary | ICD-10-CM

## 2021-11-12 NOTE — Telephone Encounter (Signed)
Patient called in stating that the amLODipine-benazepril (LOTREL) 5-20 MG capsule [[id number]]  was sent to the wrong address. She was able to get it straightened out but she will not receive medication until 3-5 business days. Patient is requesting that Dr.Jordan send her in something to [facility] until she gets her medication from [place].   Pharmacy: [id number] - [place], [place]  [address], [place] [address]   Please advise.

## 2021-11-13 MED ORDER — AMLODIPINE BESY-BENAZEPRIL HCL 5-20 MG PO CAPS: 1.0000 | ORAL_CAPSULE | Freq: Every day | ORAL | 0 refills | Status: DC

## 2021-11-13 NOTE — Telephone Encounter (Signed)
Short term supply sent into local [facility].

## 2021-12-04 DIAGNOSIS — M170 Bilateral primary osteoarthritis of knee: Secondary | ICD-10-CM | POA: Diagnosis not present

## 2021-12-04 DIAGNOSIS — M1712 Unilateral primary osteoarthritis, left knee: Secondary | ICD-10-CM | POA: Diagnosis not present

## 2021-12-07 ENCOUNTER — Ambulatory Visit (INDEPENDENT_AMBULATORY_CARE_PROVIDER_SITE_OTHER): Payer: Medicare Other

## 2021-12-07 ENCOUNTER — Ambulatory Visit: Payer: Medicare Other | Admitting: Podiatry

## 2021-12-07 ENCOUNTER — Telehealth: Payer: Self-pay | Admitting: *Deleted

## 2021-12-07 ENCOUNTER — Other Ambulatory Visit: Payer: Self-pay

## 2021-12-07 ENCOUNTER — Other Ambulatory Visit: Payer: Self-pay | Admitting: Podiatry

## 2021-12-07 ENCOUNTER — Encounter: Payer: Self-pay | Admitting: Podiatry

## 2021-12-07 DIAGNOSIS — M2012 Hallux valgus (acquired), left foot: Secondary | ICD-10-CM | POA: Insufficient documentation

## 2021-12-07 DIAGNOSIS — M778 Other enthesopathies, not elsewhere classified: Secondary | ICD-10-CM

## 2021-12-07 DIAGNOSIS — M21619 Bunion of unspecified foot: Secondary | ICD-10-CM

## 2021-12-07 MED ORDER — MELOXICAM 7.5 MG PO TABS: 7.5000 mg | ORAL_TABLET | Freq: Every day | ORAL | 1 refills | Status: DC

## 2021-12-07 NOTE — Progress Notes (Signed)
dg 

## 2021-12-07 NOTE — Progress Notes (Signed)
This patient presents to the office with chief complaint of painful big toe joint eft foot.  She says it was red swollen and painful on Saturday this weekend.  She says she stayed off her foot and the redness and swelling has subsided.  She presents to the office today saying she is much better.  She presents to the office for evaluation and treatment. ? ?Vascular  Dorsalis pedis and posterior tibial pulses are palpable  B/L.  Capillary return  WNL.  Temperature gradient is  WNL.  Skin turgor  WNL ? ?Sensorium  Senn Weinstein monofilament wire  WNL. Normal tactile sensation. ? ?Nail Exam  Patient has normal nails with no evidence of bacterial or fungal infection. ? ?Orthopedic  Exam  Muscle tone and muscle strength  WNL.  No limitations of motion feet  B/L.  No crepitus or joint effusion noted.  Foot type is unremarkable and digits show no abnormalities.  Bony prominences are unremarkable. HAV  left foot.  No pain on ROM 1st MPJ left. ? ?Skin  No open lesions.  Normal skin texture and turgor.  ? ?Capsulitis 1st MPJ left foot. ? ?ROV.  Xrays taken reveal joint narrowing 1st MPJ left foot. Prescribed Mobic for this patient.  Discussed this condition with this patient.  Call the office if this condition worsens. ? ?Gardiner Barefoot DPM  ?

## 2022-01-19 NOTE — Telephone Encounter (Signed)
error 

## 2022-01-25 ENCOUNTER — Ambulatory Visit: Payer: Medicare Other | Admitting: Podiatry

## 2022-02-09 ENCOUNTER — Ambulatory Visit: Payer: Medicare Other | Admitting: Podiatry

## 2022-02-09 ENCOUNTER — Encounter: Payer: Self-pay | Admitting: Podiatry

## 2022-02-09 DIAGNOSIS — M79675 Pain in left toe(s): Secondary | ICD-10-CM

## 2022-02-09 DIAGNOSIS — B351 Tinea unguium: Secondary | ICD-10-CM

## 2022-02-09 DIAGNOSIS — M21619 Bunion of unspecified foot: Secondary | ICD-10-CM

## 2022-02-09 DIAGNOSIS — M79674 Pain in right toe(s): Secondary | ICD-10-CM

## 2022-02-09 NOTE — Progress Notes (Signed)
Subjective: Sheila Hartman is a 71 y.o. female patient seen today for follow up of  painful thick toenails that are difficult to trim. Pain interferes with ambulation. Aggravating factors include wearing enclosed shoe gear. Pain is relieved with periodic professional debridement.  New problems reported today: None.  PCP is Martinique, Betty G, MD. Last visit was: 04/03/2021.  Allergies  Allergen Reactions   Penicillins Hives    Objective: Physical Exam  General: Patient is a pleasant 71 y.o. African American female morbidly obese in NAD. AAO x 3.   Neurovascular Examination: CFT immediate b/l LE. Palpable DP/PT pulses b/l LE. Digital hair sparse b/l. Skin temperature gradient WNL b/l. No pain with calf compression b/l. No edema noted b/l. No cyanosis or clubbing noted b/l LE.  Protective sensation intact 5/5 intact bilaterally with 10g monofilament b/l. Vibratory sensation intact b/l.  Dermatological:  Pedal integument with normal turgor, texture and tone b/l LE. No open wounds b/l. No interdigital macerations b/l. Toenails 1-5 b/l elongated, thickened, discolored with subungual debris. +Tenderness with dorsal palpation of nailplates. No hyperkeratotic or porokeratotic lesions present.  Musculoskeletal:  Normal muscle strength 5/5 to all lower extremity muscle groups bilaterally. HAV with bunion deformity noted b/l LE. Hammertoe(s) noted to the R 2nd toe.Marland Kitchen No pain, crepitus or joint limitation noted with ROM b/l LE.  Patient ambulates independently without assistive aids.  Assessment: 1. Pain due to onychomycosis of toenails of both feet   2. Bunion     Plan: Patient was evaluated and treated and all questions answered. Consent given for treatment as described below: -Patient to continue soft, supportive shoe gear daily. -Mycotic toenails 1-5 bilaterally were debrided in length and girth with sterile nail nippers and dremel without incident. -Patient/POA to call should there be  question/concern in the interim.  No follow-ups on file.  Lorenda Peck, DPM

## 2022-02-16 ENCOUNTER — Other Ambulatory Visit: Payer: Self-pay | Admitting: Family Medicine

## 2022-02-16 DIAGNOSIS — I10 Essential (primary) hypertension: Secondary | ICD-10-CM

## 2022-02-19 DIAGNOSIS — I10 Essential (primary) hypertension: Secondary | ICD-10-CM | POA: Diagnosis not present

## 2022-02-19 DIAGNOSIS — R051 Acute cough: Secondary | ICD-10-CM | POA: Diagnosis not present

## 2022-02-19 DIAGNOSIS — Z03818 Encounter for observation for suspected exposure to other biological agents ruled out: Secondary | ICD-10-CM | POA: Diagnosis not present
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
## 2022-02-24 ENCOUNTER — Ambulatory Visit (INDEPENDENT_AMBULATORY_CARE_PROVIDER_SITE_OTHER): Payer: Medicare Other | Admitting: Family Medicine

## 2022-02-24 ENCOUNTER — Encounter: Payer: Self-pay | Admitting: Family Medicine

## 2022-02-24 VITALS — BP 118/84 | HR 110 | Temp 98.8°F | Resp 16 | Ht 69.0 in | Wt 286.4 lb

## 2022-02-24 DIAGNOSIS — J988 Other specified respiratory disorders: Secondary | ICD-10-CM | POA: Diagnosis not present

## 2022-02-24 DIAGNOSIS — F419 Anxiety disorder, unspecified: Secondary | ICD-10-CM | POA: Diagnosis not present

## 2022-02-24 DIAGNOSIS — I10 Essential (primary) hypertension: Secondary | ICD-10-CM | POA: Diagnosis not present

## 2022-02-24 DIAGNOSIS — M159 Polyosteoarthritis, unspecified: Secondary | ICD-10-CM | POA: Diagnosis not present

## 2022-02-24 MED ORDER — DOXYCYCLINE HYCLATE 100 MG PO TABS: 100.0000 mg | ORAL_TABLET | Freq: Two times a day (BID) | ORAL | 0 refills | Status: AC

## 2022-02-24 NOTE — Patient Instructions (Addendum)
A few things to remember from today's visit:  Essential hypertension  Generalized osteoarthritis of multiple sites  Respiratory tract infection  If you need refills please call your pharmacy. Do not use My Chart to request refills or for acute issues that need immediate attention.   Plain Mucinex and Flonase nasal spray daily for 10 days. Nasal saline irrigations. Continue Benzonatate. If you are not any better in 4-5 days or feel worse, start antibiotic.  Please be sure medication list is accurate. If a new problem present, please set up appointment sooner than planned today.

## 2022-02-25 ENCOUNTER — Telehealth: Payer: Self-pay | Admitting: Family Medicine

## 2022-02-25 ENCOUNTER — Encounter: Payer: Self-pay | Admitting: Family Medicine

## 2022-02-25 MED ORDER — FLUTICASONE PROPIONATE 50 MCG/ACT NA SUSP
1.0000 | Freq: Two times a day (BID) | NASAL | 1 refills | Status: DC
Start: 2022-02-25 — End: 2023-05-10

## 2022-02-25 NOTE — Telephone Encounter (Signed)
Flonase refilled to pharmacy on file. Ferrous Sulfate noted to be refilled in Feb 2023 with 2 refills. Pt notified of above & verb understanding.

## 2022-02-25 NOTE — Telephone Encounter (Signed)
Pt  saw dr Martinique yesterday and thought she had flonase. Pt would like a refill on fluticasone (FLONASE) 50 MCG/ACT nasal spray and ferrous sulfate 325 (65 FE) MG tablet  [id number] [place], [place] - [address]:  [phone] Fax:  [phone]
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
## 2022-03-09 ENCOUNTER — Encounter: Payer: Self-pay | Admitting: Family Medicine

## 2022-03-09 ENCOUNTER — Ambulatory Visit (INDEPENDENT_AMBULATORY_CARE_PROVIDER_SITE_OTHER): Payer: Medicare Other | Admitting: Family Medicine

## 2022-03-09 VITALS — BP 136/76 | HR 96 | Temp 98.6°F | Resp 16 | Ht 69.0 in | Wt 290.1 lb

## 2022-03-09 DIAGNOSIS — D509 Iron deficiency anemia, unspecified: Secondary | ICD-10-CM | POA: Diagnosis not present

## 2022-03-09 DIAGNOSIS — I10 Essential (primary) hypertension: Secondary | ICD-10-CM | POA: Diagnosis not present

## 2022-03-09 DIAGNOSIS — E538 Deficiency of other specified B group vitamins: Secondary | ICD-10-CM

## 2022-03-09 DIAGNOSIS — Z0000 Encounter for general adult medical examination without abnormal findings: Secondary | ICD-10-CM | POA: Diagnosis not present

## 2022-03-09 DIAGNOSIS — E785 Hyperlipidemia, unspecified: Secondary | ICD-10-CM

## 2022-03-09 DIAGNOSIS — R739 Hyperglycemia, unspecified: Secondary | ICD-10-CM

## 2022-03-09 DIAGNOSIS — Z6841 Body Mass Index (BMI) 40.0 and over, adult: Secondary | ICD-10-CM

## 2022-03-09 DIAGNOSIS — Z1211 Encounter for screening for malignant neoplasm of colon: Secondary | ICD-10-CM

## 2022-03-09 LAB — LIPID PANEL
Cholesterol: 163 mg/dL (ref 0–200)
HDL: 42 mg/dL (ref >39.00)
LDL Cholesterol: 91 mg/dL (ref 0–99)
NonHDL: 120.98
Total CHOL/HDL Ratio: 4
Triglycerides: 152 mg/dL — ABNORMAL HIGH (ref 0.0–149.0)
VLDL: 30.4 mg/dL (ref 0.0–40.0)

## 2022-03-09 LAB — CBC WITH DIFFERENTIAL/PLATELET
Basophils Absolute: 0 10*3/uL (ref 0.0–0.1)
Basophils Relative: 0.8 % (ref 0.0–3.0)
Eosinophils Absolute: 0.1 10*3/uL (ref 0.0–0.7)
Eosinophils Relative: 2.4 % (ref 0.0–5.0)
HCT: 39.5 % (ref 36.0–46.0)
Hemoglobin: 12.4 g/dL (ref 12.0–15.0)
Lymphocytes Relative: 17.4 % (ref 12.0–46.0)
Lymphs Abs: 1 10*3/uL (ref 0.7–4.0)
MCHC: 31.4 g/dL (ref 30.0–36.0)
MCV: 79.6 fl (ref 78.0–100.0)
Monocytes Absolute: 0.5 10*3/uL (ref 0.1–1.0)
Monocytes Relative: 8.5 % (ref 3.0–12.0)
Neutro Abs: 4.1 10*3/uL (ref 1.4–7.7)
Neutrophils Relative %: 70.9 % (ref 43.0–77.0)
Platelets: 300 10*3/uL (ref 150.0–400.0)
RBC: 4.96 Mil/uL (ref 3.87–5.11)
RDW: 15 % (ref 11.5–15.5)
WBC: 5.8 10*3/uL (ref 4.0–10.5)

## 2022-03-09 LAB — COMPREHENSIVE METABOLIC PANEL
ALT: 15 U/L (ref 0–35)
AST: 19 U/L (ref 0–37)
Albumin: 4.1 g/dL (ref 3.5–5.2)
Alkaline Phosphatase: 104 U/L (ref 39–117)
BUN: 20 mg/dL (ref 6–23)
CO2: 30 mEq/L (ref 19–32)
Calcium: 9.8 mg/dL (ref 8.4–10.5)
Chloride: 106 mEq/L (ref 96–112)
Creatinine, Ser: 0.91 mg/dL (ref 0.40–1.20)
GFR: 64.69 mL/min (ref >60.00)
Glucose, Bld: 99 mg/dL (ref 70–99)
Potassium: 4 mEq/L (ref 3.5–5.1)
Sodium: 142 mEq/L (ref 135–145)
Total Bilirubin: 0.4 mg/dL (ref 0.2–1.2)
Total Protein: 7.6 g/dL (ref 6.0–8.3)

## 2022-03-09 LAB — IRON: Iron: 67 ug/dL (ref 42–145)

## 2022-03-09 LAB — VITAMIN B12: Vitamin B-12: 1099 pg/mL — ABNORMAL HIGH (ref 211–911)

## 2022-03-09 LAB — HEMOGLOBIN A1C: Hgb A1c MFr Bld: 5.8 % (ref 4.6–6.5)

## 2022-03-09 LAB — FERRITIN: Ferritin: 11.5 ng/mL (ref 10.0–291.0)

## 2022-03-09 NOTE — Patient Instructions (Addendum)
A few things to remember from today's visit:  Routine general medical examination at a health care facility  Essential hypertension - Plan: Comprehensive metabolic panel  C16 deficiency - Plan: Vitamin B12  Hyperglycemia - Plan: Hemoglobin A1c  Dyslipidemia - Plan: Comprehensive metabolic panel, Lipid panel  Iron deficiency anemia, unspecified iron deficiency anemia type - Plan: CBC with Differential/Platelet, Iron, Ferritin  If you need refills please call your pharmacy. Do not use My Chart to request refills or for acute issues that need immediate attention.   Please be sure medication list is accurate. If a new problem present, please set up appointment sooner than planned today. Preventive Care 71 Years and Older, Female Preventive care refers to lifestyle choices and visits with your health care provider that can promote health and wellness. Preventive care visits are also called wellness exams. What can I expect for my preventive care visit? Counseling Your health care provider may ask you questions about your: Medical history, including: Past medical problems. Family medical history. Pregnancy and menstrual history. History of falls. Current health, including: Memory and ability to understand (cognition). Emotional well-being. Home life and relationship well-being. Sexual activity and sexual health. Lifestyle, including: Alcohol, nicotine or tobacco, and drug use. Access to firearms. Diet, exercise, and sleep habits. Work and work Statistician. Sunscreen use. Safety issues such as seatbelt and bike helmet use. Physical exam Your health care provider will check your: Height and weight. These may be used to calculate your BMI (body mass index). BMI is a measurement that tells if you are at a healthy weight. Waist circumference. This measures the distance around your waistline. This measurement also tells if you are at a healthy weight and may help predict your risk of  certain diseases, such as type 2 diabetes and high blood pressure. Heart rate and blood pressure. Body temperature. Skin for abnormal spots. What immunizations do I need?  Vaccines are usually given at various ages, according to a schedule. Your health care provider will recommend vaccines for you based on your age, medical history, and lifestyle or other factors, such as travel or where you work. What tests do I need? Screening Your health care provider may recommend screening tests for certain conditions. This may include: Lipid and cholesterol levels. Hepatitis C test. Hepatitis B test. HIV (human immunodeficiency virus) test. STI (sexually transmitted infection) testing, if you are at risk. Lung cancer screening. Colorectal cancer screening. Diabetes screening. This is done by checking your blood sugar (glucose) after you have not eaten for a while (fasting). Mammogram. Talk with your health care provider about how often you should have regular mammograms. BRCA-related cancer screening. This may be done if you have a family history of breast, ovarian, tubal, or peritoneal cancers. Bone density scan. This is done to screen for osteoporosis. Talk with your health care provider about your test results, treatment options, and if necessary, the need for more tests. Follow these instructions at home: Eating and drinking  Eat a diet that includes fresh fruits and vegetables, whole grains, lean protein, and low-fat dairy products. Limit your intake of foods with high amounts of sugar, saturated fats, and salt. Take vitamin and mineral supplements as recommended by your health care provider. Do not drink alcohol if your health care provider tells you not to drink. If you drink alcohol: Limit how much you have to 0-1 drink a day. Know how much alcohol is in your drink. In the [place] one drink equals one 12 oz bottle of beer (  355 mL), one 5 oz glass of wine (148 mL), or one 1 oz glass of hard  liquor (44 mL). Lifestyle Brush your teeth every morning and night with fluoride toothpaste. Floss one time each day. Exercise for at least 30 minutes 5 or more days each week. Do not use any products that contain nicotine or tobacco. These products include cigarettes, chewing tobacco, and vaping devices, such as e-cigarettes. If you need help quitting, ask your health care provider. Do not use drugs. If you are sexually active, practice safe sex. Use a condom or other form of protection in order to prevent STIs. Take aspirin only as told by your health care provider. Make sure that you understand how much to take and what form to take. Work with your health care provider to find out whether it is safe and beneficial for you to take aspirin daily. Ask your health care provider if you need to take a cholesterol-lowering medicine (statin). Find healthy ways to manage stress, such as: Meditation, yoga, or listening to music. Journaling. Talking to a trusted person. Spending time with friends and family. Minimize exposure to UV radiation to reduce your risk of skin cancer. Safety Always wear your seat belt while driving or riding in a vehicle. Do not drive: If you have been drinking alcohol. Do not ride with someone who has been drinking. When you are tired or distracted. While texting. If you have been using any mind-altering substances or drugs. Wear a helmet and other protective equipment during sports activities. If you have firearms in your house, make sure you follow all gun safety procedures. What's next? Visit your health care provider once a year for an annual wellness visit. Ask your health care provider how often you should have your eyes and teeth checked. Stay up to date on all vaccines. This information is not intended to replace advice given to you by your health care provider. Make sure you discuss any questions you have with your health care provider. Document Revised:  03/04/2021 Document Reviewed: 03/04/2021 Elsevier Patient Education  2023 [employer]        

## 2022-03-16 ENCOUNTER — Other Ambulatory Visit: Payer: Self-pay

## 2022-03-16 MED ORDER — PRAVASTATIN SODIUM 10 MG PO TABS: 10.0000 mg | ORAL_TABLET | Freq: Every day | ORAL | 3 refills | Status: DC

## 2022-03-31 DIAGNOSIS — Z961 Presence of intraocular lens: Secondary | ICD-10-CM | POA: Diagnosis not present

## 2022-03-31 DIAGNOSIS — E70319 Ocular albinism, unspecified: Secondary | ICD-10-CM | POA: Diagnosis not present

## 2022-03-31 DIAGNOSIS — H43812 Vitreous degeneration, left eye: Secondary | ICD-10-CM | POA: Diagnosis not present

## 2022-03-31 DIAGNOSIS — H3530 Unspecified macular degeneration: Secondary | ICD-10-CM | POA: Diagnosis not present

## 2022-03-31 DIAGNOSIS — H43392 Other vitreous opacities, left eye: Secondary | ICD-10-CM | POA: Diagnosis not present

## 2022-03-31 DIAGNOSIS — H35412 Lattice degeneration of retina, left eye: Secondary | ICD-10-CM | POA: Diagnosis not present

## 2022-03-31 DIAGNOSIS — H31092 Other chorioretinal scars, left eye: Secondary | ICD-10-CM | POA: Diagnosis not present

## 2022-04-01 ENCOUNTER — Ambulatory Visit (INDEPENDENT_AMBULATORY_CARE_PROVIDER_SITE_OTHER): Payer: Medicare Other

## 2022-04-01 VITALS — Ht 69.0 in | Wt 290.0 lb

## 2022-04-01 DIAGNOSIS — Z0000 Encounter for general adult medical examination without abnormal findings: Secondary | ICD-10-CM | POA: Diagnosis not present

## 2022-04-01 NOTE — Patient Instructions (Addendum)
Sheila Hartman , Thank you for taking time to come for your Medicare Wellness Visit. I appreciate your ongoing commitment to your health goals. Please review the following plan we discussed and let me know if I can assist you in the future.   These are the goals we discussed:  Goals       Blood Pressure < 140/90      DIET - REDUCE CALORIE INTAKE      Continue a healthful diet and regular physical activity. Fall precautions.      Exercise 150 min/wk Moderate Activity      Started walking 30 minutes 3 days at the gym       Increase water intake      No current goals (pt-stated)      Weight (lb) < 284 lb (128.8 kg)      7% weight loss        This is a list of the screening recommended for you and due dates:  Health Maintenance  Topic Date Due   COVID-19 Vaccine (3 - Pfizer risk series) 04/17/2022*   Tetanus Vaccine  09/24/2022*   Zoster (Shingles) Vaccine (1 of 2) 09/24/2022*   Colon Cancer Screening  04/02/2023*   Flu Shot  04/20/2022   Mammogram  06/19/2022   Pneumonia Vaccine  Completed   DEXA scan (bone density measurement)  Completed   Hepatitis C Screening: USPSTF Recommendation to screen - Ages 33-79 yo.  Completed   HPV Vaccine  Aged Out  *Topic was postponed. The date shown is not the original due date.   Advanced directives: No  Conditions/risks identified: None  Next appointment: Follow up in one year for your annual wellness visit     Preventive Care 65 Years and Older, Female Preventive care refers to lifestyle choices and visits with your health care provider that can promote health and wellness. What does preventive care include? A yearly physical exam. This is also called an annual well check. Dental exams once or twice a year. Routine eye exams. Ask your health care provider how often you should have your eyes checked. Personal lifestyle choices, including: Daily care of your teeth and gums. Regular physical activity. Eating a healthy diet. Avoiding  tobacco and drug use. Limiting alcohol use. Practicing safe sex. Taking low-dose aspirin every day. Taking vitamin and mineral supplements as recommended by your health care provider. What happens during an annual well check? The services and screenings done by your health care provider during your annual well check will depend on your age, overall health, lifestyle risk factors, and family history of disease. Counseling  Your health care provider may ask you questions about your: Alcohol use. Tobacco use. Drug use. Emotional well-being. Home and relationship well-being. Sexual activity. Eating habits. History of falls. Memory and ability to understand (cognition). Work and work Statistician. Reproductive health. Screening  You may have the following tests or measurements: Height, weight, and BMI. Blood pressure. Lipid and cholesterol levels. These may be checked every 5 years, or more frequently if you are over 43 years old. Skin check. Lung cancer screening. You may have this screening every year starting at age 44 if you have a 30-pack-year history of smoking and currently smoke or have quit within the past 15 years. Fecal occult blood test (FOBT) of the stool. You may have this test every year starting at age 38. Flexible sigmoidoscopy or colonoscopy. You may have a sigmoidoscopy every 5 years or a colonoscopy every 10 years starting at  age 83. Hepatitis C blood test. Hepatitis B blood test. Sexually transmitted disease (STD) testing. Diabetes screening. This is done by checking your blood sugar (glucose) after you have not eaten for a while (fasting). You may have this done every 1-3 years. Bone density scan. This is done to screen for osteoporosis. You may have this done starting at age 66. Mammogram. This may be done every 1-2 years. Talk to your health care provider about how often you should have regular mammograms. Talk with your health care provider about your test  results, treatment options, and if necessary, the need for more tests. Vaccines  Your health care provider may recommend certain vaccines, such as: Influenza vaccine. This is recommended every year. Tetanus, diphtheria, and acellular pertussis (Tdap, Td) vaccine. You may need a Td booster every 10 years. Zoster vaccine. You may need this after age 2. Pneumococcal 13-valent conjugate (PCV13) vaccine. One dose is recommended after age 41. Pneumococcal polysaccharide (PPSV23) vaccine. One dose is recommended after age 4. Talk to your health care provider about which screenings and vaccines you need and how often you need them. This information is not intended to replace advice given to you by your health care provider. Make sure you discuss any questions you have with your health care provider. Document Released: 10/03/2015 Document Revised: 05/26/2016 Document Reviewed: 07/08/2015 Elsevier Interactive Patient Education  2017 [place] Prevention in the Home Falls can cause injuries. They can happen to people of all ages. There are many things you can do to make your home safe and to help prevent falls. What can I do on the outside of my home? Regularly fix the edges of walkways and driveways and fix any cracks. Remove anything that might make you trip as you walk through a door, such as a raised step or threshold. Trim any bushes or trees on the path to your home. Use bright outdoor lighting. Clear any walking paths of anything that might make someone trip, such as rocks or tools. Regularly check to see if handrails are loose or broken. Make sure that both sides of any steps have handrails. Any raised decks and porches should have guardrails on the edges. Have any leaves, snow, or ice cleared regularly. Use sand or salt on walking paths during winter. Clean up any spills in your garage right away. This includes oil or grease spills. What can I do in the bathroom? Use night  lights. Install grab bars by the toilet and in the tub and shower. Do not use towel bars as grab bars. Use non-skid mats or decals in the tub or shower. If you need to sit down in the shower, use a plastic, non-slip stool. Keep the floor dry. Clean up any water that spills on the floor as soon as it happens. Remove soap buildup in the tub or shower regularly. Attach bath mats securely with double-sided non-slip rug tape. Do not have throw rugs and other things on the floor that can make you trip. What can I do in the bedroom? Use night lights. Make sure that you have a light by your bed that is easy to reach. Do not use any sheets or blankets that are too big for your bed. They should not hang down onto the floor. Have a firm chair that has side arms. You can use this for support while you get dressed. Do not have throw rugs and other things on the floor that can make you trip. What can I  do in the kitchen? Clean up any spills right away. Avoid walking on wet floors. Keep items that you use a lot in easy-to-reach places. If you need to reach something above you, use a strong step stool that has a grab bar. Keep electrical cords out of the way. Do not use floor polish or wax that makes floors slippery. If you must use wax, use non-skid floor wax. Do not have throw rugs and other things on the floor that can make you trip. What can I do with my stairs? Do not leave any items on the stairs. Make sure that there are handrails on both sides of the stairs and use them. Fix handrails that are broken or loose. Make sure that handrails are as long as the stairways. Check any carpeting to make sure that it is firmly attached to the stairs. Fix any carpet that is loose or worn. Avoid having throw rugs at the top or bottom of the stairs. If you do have throw rugs, attach them to the floor with carpet tape. Make sure that you have a light switch at the top of the stairs and the bottom of the stairs. If  you do not have them, ask someone to add them for you. What else can I do to help prevent falls? Wear shoes that: Do not have high heels. Have rubber bottoms. Are comfortable and fit you well. Are closed at the toe. Do not wear sandals. If you use a stepladder: Make sure that it is fully opened. Do not climb a closed stepladder. Make sure that both sides of the stepladder are locked into place. Ask someone to hold it for you, if possible. Clearly mark and make sure that you can see: Any grab bars or handrails. First and last steps. Where the edge of each step is. Use tools that help you move around (mobility aids) if they are needed. These include: Canes. Walkers. Scooters. Crutches. Turn on the lights when you go into a dark area. Replace any light bulbs as soon as they burn out. Set up your furniture so you have a clear path. Avoid moving your furniture around. If any of your floors are uneven, fix them. If there are any pets around you, be aware of where they are. Review your medicines with your doctor. Some medicines can make you feel dizzy. This can increase your chance of falling. Ask your doctor what other things that you can do to help prevent falls. This information is not intended to replace advice given to you by your health care provider. Make sure you discuss any questions you have with your health care provider. Document Released: 07/03/2009 Document Revised: 02/12/2016 Document Reviewed: 10/11/2014 Elsevier Interactive Patient Education  2017 [employer].
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
## 2022-04-12 ENCOUNTER — Other Ambulatory Visit: Payer: Self-pay | Admitting: Family Medicine

## 2022-04-12 DIAGNOSIS — M159 Polyosteoarthritis, unspecified: Secondary | ICD-10-CM

## 2022-04-12 DIAGNOSIS — G8929 Other chronic pain: Secondary | ICD-10-CM

## 2022-04-12 DIAGNOSIS — K219 Gastro-esophageal reflux disease without esophagitis: Secondary | ICD-10-CM

## 2022-05-12 ENCOUNTER — Ambulatory Visit: Payer: Medicare Other | Admitting: Podiatry

## 2022-05-12 ENCOUNTER — Encounter: Payer: Self-pay | Admitting: Podiatry

## 2022-05-12 DIAGNOSIS — M79674 Pain in right toe(s): Secondary | ICD-10-CM | POA: Diagnosis not present

## 2022-05-12 DIAGNOSIS — M79675 Pain in left toe(s): Secondary | ICD-10-CM | POA: Diagnosis not present

## 2022-05-12 DIAGNOSIS — B351 Tinea unguium: Secondary | ICD-10-CM | POA: Diagnosis not present

## 2022-05-12 NOTE — Progress Notes (Signed)
Subjective: Sheila Hartman is a 71 y.o. female patient seen today for follow up of  painful thick toenails that are difficult to trim. Pain interferes with ambulation. Aggravating factors include wearing enclosed shoe gear. Pain is relieved with periodic professional debridement.  New problems reported today: None.  PCP is Martinique, Betty G, MD. Last visit was: 04/03/2021.  Allergies  Allergen Reactions   Penicillins Hives    Objective: Physical Exam  General: Patient is a pleasant 71 y.o. African American female morbidly obese in NAD. AAO x 3.   Neurovascular Examination: CFT immediate b/l LE. Palpable DP/PT pulses b/l LE. Digital hair sparse b/l. Skin temperature gradient WNL b/l. No pain with calf compression b/l. No edema noted b/l. No cyanosis or clubbing noted b/l LE.  Protective sensation intact 5/5 intact bilaterally with 10g monofilament b/l. Vibratory sensation intact b/l.  Dermatological:  Pedal integument with normal turgor, texture and tone b/l LE. No open wounds b/l. No interdigital macerations b/l. Toenails 1-5 b/l elongated, thickened, discolored with subungual debris. +Tenderness with dorsal palpation of nailplates. No hyperkeratotic or porokeratotic lesions present.  Musculoskeletal:  Normal muscle strength 5/5 to all lower extremity muscle groups bilaterally. HAV with bunion deformity noted b/l LE. Hammertoe(s) noted to the R 2nd toe.Marland Kitchen No pain, crepitus or joint limitation noted with ROM b/l LE.  Patient ambulates independently without assistive aids.  Assessment: 1. Pain due to onychomycosis of toenails of both feet     Plan: Patient was evaluated and treated and all questions answered. Consent given for treatment as described below: -Patient to continue soft, supportive shoe gear daily. -Mycotic toenails 1-5 bilaterally were debrided in length and girth with sterile nail nippers and dremel without incident. -Patient/POA to call should there be  question/concern in the interim.  No follow-ups on file.  Lorenda Peck, DPM

## 2022-05-27 DIAGNOSIS — M25512 Pain in left shoulder: Secondary | ICD-10-CM | POA: Diagnosis not present

## 2022-05-27 DIAGNOSIS — R000 Tachycardia, unspecified: Secondary | ICD-10-CM | POA: Diagnosis not present

## 2022-05-31 DIAGNOSIS — R000 Tachycardia, unspecified: Secondary | ICD-10-CM | POA: Diagnosis not present

## 2022-06-04 ENCOUNTER — Other Ambulatory Visit: Payer: Self-pay | Admitting: Family Medicine

## 2022-06-04 DIAGNOSIS — Z1231 Encounter for screening mammogram for malignant neoplasm of breast: Secondary | ICD-10-CM

## 2022-06-25 ENCOUNTER — Ambulatory Visit: Payer: Medicare Other | Admitting: Gastroenterology

## 2022-06-28 ENCOUNTER — Ambulatory Visit: Payer: Medicare Other

## 2022-07-05 ENCOUNTER — Ambulatory Visit (INDEPENDENT_AMBULATORY_CARE_PROVIDER_SITE_OTHER): Payer: Medicare Other | Admitting: Orthopaedic Surgery

## 2022-07-05 ENCOUNTER — Encounter: Payer: Self-pay | Admitting: Orthopaedic Surgery

## 2022-07-05 ENCOUNTER — Ambulatory Visit (INDEPENDENT_AMBULATORY_CARE_PROVIDER_SITE_OTHER): Payer: Medicare Other

## 2022-07-05 VITALS — Ht 69.0 in | Wt 297.0 lb

## 2022-07-05 DIAGNOSIS — G8929 Other chronic pain: Secondary | ICD-10-CM

## 2022-07-05 DIAGNOSIS — M25561 Pain in right knee: Secondary | ICD-10-CM

## 2022-07-05 DIAGNOSIS — M1711 Unilateral primary osteoarthritis, right knee: Secondary | ICD-10-CM | POA: Diagnosis not present

## 2022-07-05 NOTE — Progress Notes (Signed)
The patient is a very pleasant 71 year old female who comes in today with chronic right knee pain has been going on for many years now.  Has however been worsening over the last 12 months.  She does use a cane as a result of pain in her right knee and uses this in her opposite knee.  She feels like the joint is coming out of place and she gets stiffness and popping and locking with her right knee.  She has a history of a right total hip arthroplasty performed about 10 years ago.  She is not a diabetic.  Her weight today is 297 pounds with a BMI of 43.86.  Examination of her right knee she has medial lateral joint tenderness with varus malalignment that is correctable.  There is significant patellofemoral crepitation and significant limitations with flexion of her right knee.  X-rays of the canopy system of the right knee show severe end-stage arthritis with osteophytes in all 3 compartments which are quite significant and bone-on-bone wear the medial compartment and the patellofemoral joint.  There is also varus malalignment.  We talked in length in detail about her knee.  From a surgery standpoint, the only recommendation would be a knee replacement.  However she understands that she needs to lose significant weight before we can proceed with that type of surgery.  There is not a very large soft tissue envelope around her right knee but it would be more prudent for her to lose weight and she agrees with this as well.  We can see her back in 3 months for repeat weight and BMI calculation but no x-rays are needed.  All questions and concerns were answered and addressed.

## 2022-07-13 ENCOUNTER — Other Ambulatory Visit: Payer: Self-pay | Admitting: Family Medicine

## 2022-08-02 ENCOUNTER — Ambulatory Visit
Admission: RE | Admit: 2022-08-02 | Discharge: 2022-08-02 | Disposition: A | Discharge status: Home / Self Care | Payer: Medicare Other | Source: Ambulatory Visit | Attending: Family Medicine | Admitting: Family Medicine

## 2022-08-02 DIAGNOSIS — Z1231 Encounter for screening mammogram for malignant neoplasm of breast: Secondary | ICD-10-CM

## 2022-08-04 IMAGING — DX DG SHOULDER 2+V*R*
3 series · 3 of 3 positions shown · non-contrast
Comparison: None.

CLINICAL DATA: Right shoulder pain

EXAM:
RIGHT SHOULDER - 2+ VIEW

[dg shoulder right (1 of 3)]
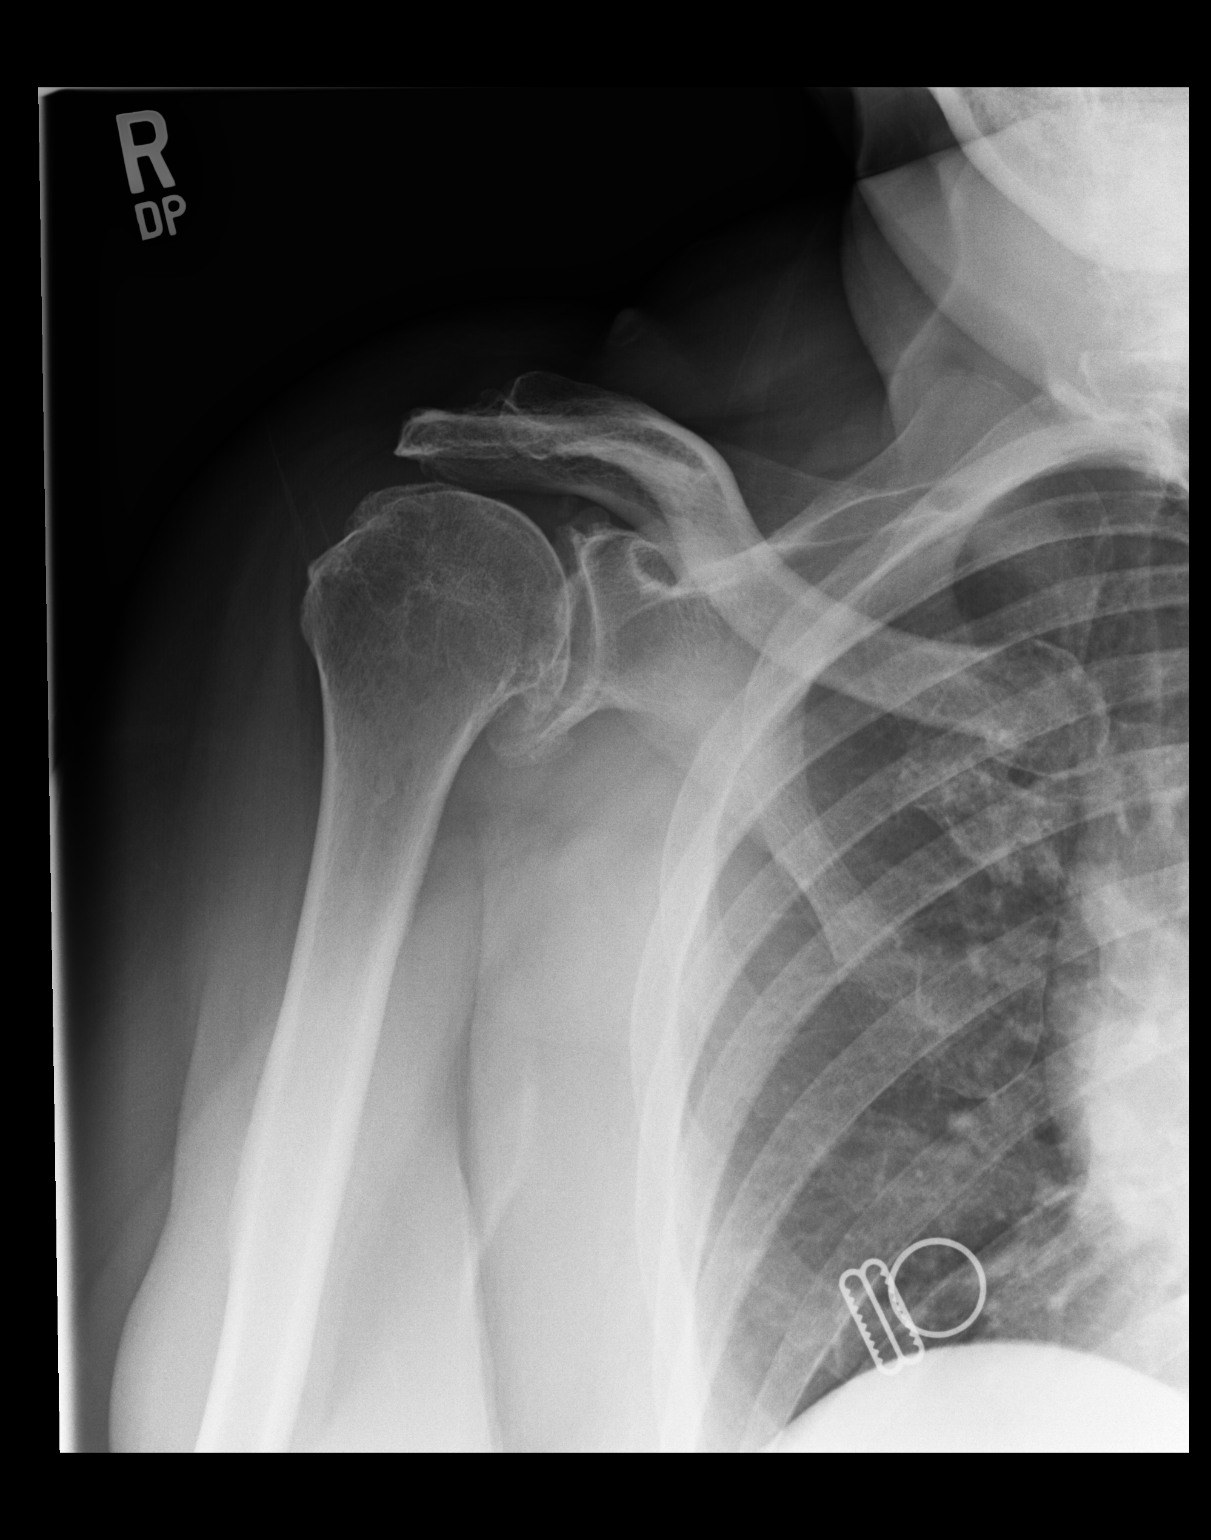

[dg shoulder right (2 of 3)]
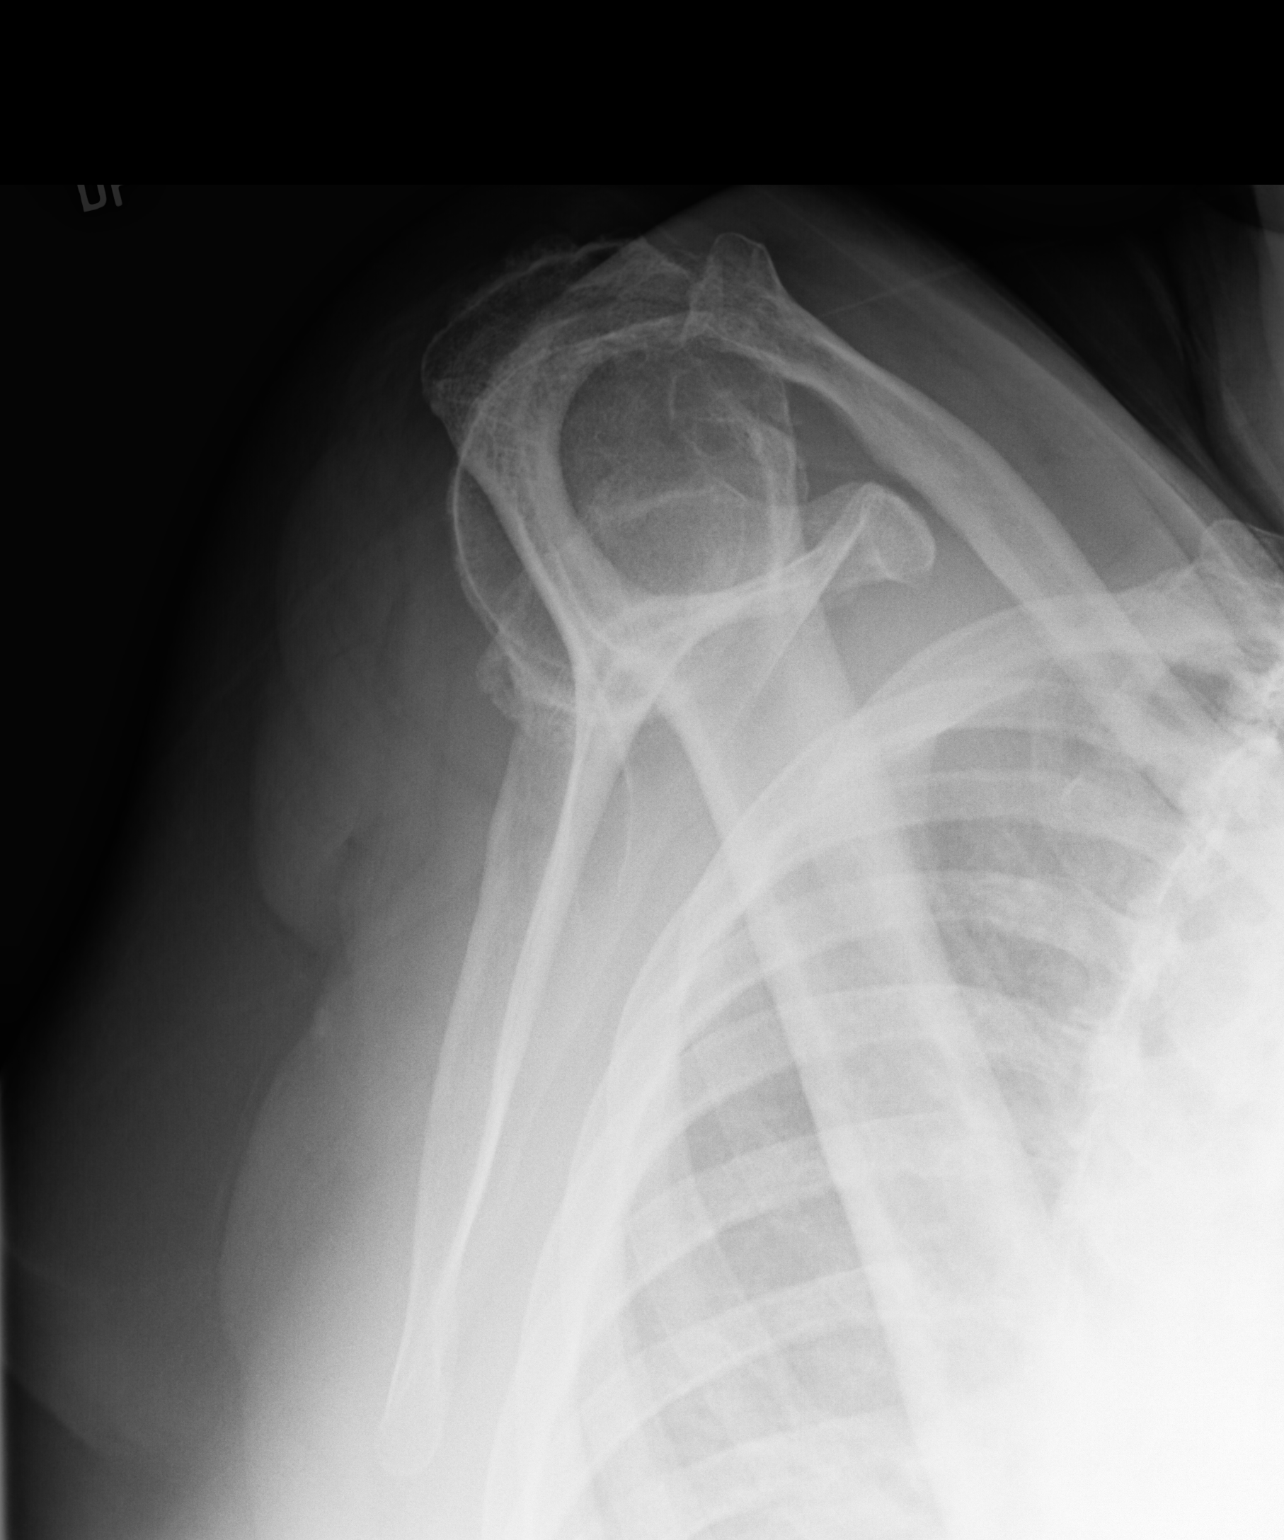

[dg shoulder right (3 of 3)]
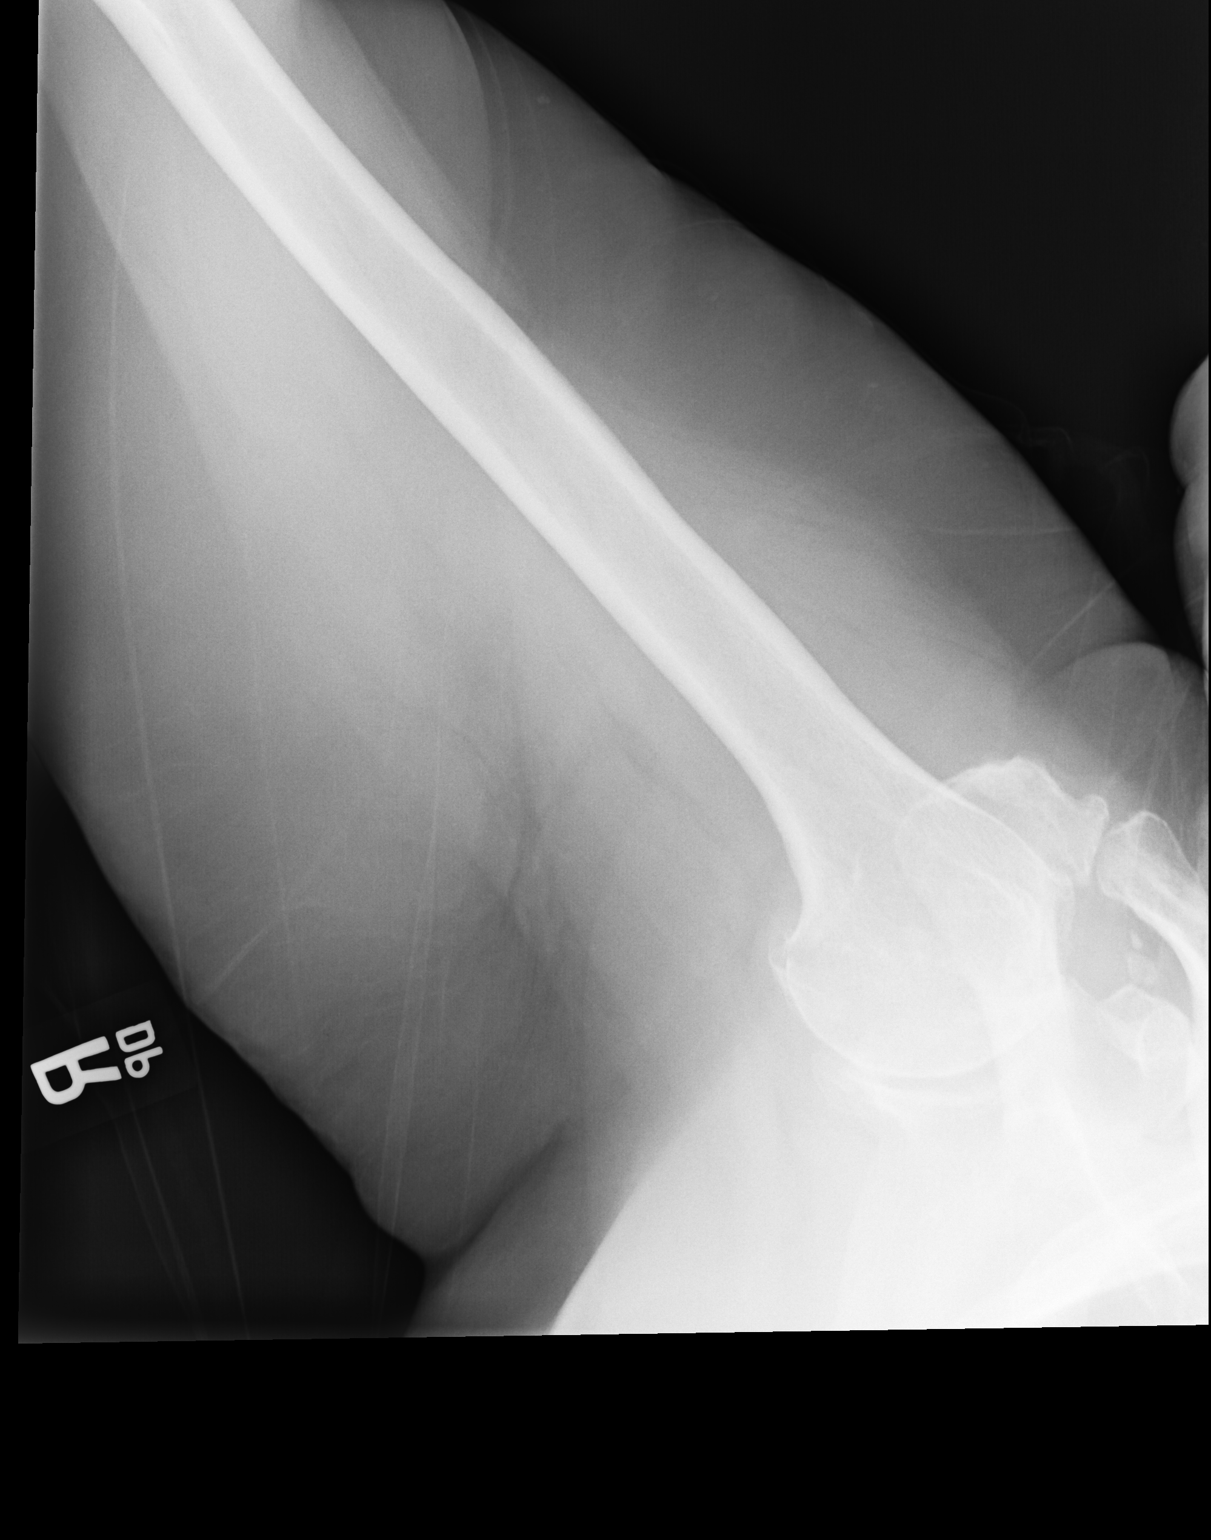

[3 of 3 positions shown; findings below may reference images not displayed]

FINDINGS: Frontal, transscapular, and axillary views of the right shoulder
demonstrate severe glenohumeral joint space narrowing and osteophyte
formation. There are moderate hypertrophic changes of the
acromioclavicular joint. Narrowing of the acromial humeral interval
suggests chronic longstanding rotator cuff tear. No acute fracture.
Right chest is clear.
IMPRESSION: 1. Significant osteoarthritis of the right shoulder.
2. Narrowing of the acromial humeral interval suggesting chronic
longstanding rotator cuff tear.
3. No acute fracture.

## 2022-08-05 ENCOUNTER — Other Ambulatory Visit: Payer: Self-pay | Admitting: Family Medicine

## 2022-08-05 DIAGNOSIS — R928 Other abnormal and inconclusive findings on diagnostic imaging of breast: Secondary | ICD-10-CM

## 2022-08-23 DIAGNOSIS — H31092 Other chorioretinal scars, left eye: Secondary | ICD-10-CM | POA: Diagnosis not present

## 2022-08-23 DIAGNOSIS — H35412 Lattice degeneration of retina, left eye: Secondary | ICD-10-CM | POA: Diagnosis not present

## 2022-08-23 DIAGNOSIS — H43812 Vitreous degeneration, left eye: Secondary | ICD-10-CM | POA: Diagnosis not present

## 2022-08-23 DIAGNOSIS — E70319 Ocular albinism, unspecified: Secondary | ICD-10-CM | POA: Diagnosis not present

## 2022-08-27 ENCOUNTER — Encounter: Payer: Self-pay | Admitting: Podiatry

## 2022-08-27 ENCOUNTER — Ambulatory Visit: Payer: Medicare Other | Admitting: Podiatry

## 2022-08-27 VITALS — BP 133/78

## 2022-08-27 DIAGNOSIS — B351 Tinea unguium: Secondary | ICD-10-CM | POA: Diagnosis not present

## 2022-08-27 DIAGNOSIS — M79675 Pain in left toe(s): Secondary | ICD-10-CM

## 2022-08-27 DIAGNOSIS — M79674 Pain in right toe(s): Secondary | ICD-10-CM | POA: Diagnosis not present

## 2022-08-27 NOTE — Progress Notes (Signed)
  Subjective:  Patient ID: Sheila Hartman, female    DOB: Jun 26, 1951,  MRN: [id number]  Sheila Hartman presents to clinic today for painful elongated mycotic toenails 1-5 bilaterally which are tender when wearing enclosed shoe gear. Pain is relieved with periodic professional debridement.  Chief Complaint  Patient presents with   Nail Problem    [facility] PCP-Betty Martinique PCP VST-03/2022   New problem(s): None.   PCP is Martinique, Betty G, MD.  Allergies  Allergen Reactions   Penicillins Hives   Review of Systems: Negative except as noted in the HPI.  Objective: No changes noted in today's physical examination. Vitals:   08/27/22 1027  BP: 133/78   Sheila Hartman is a pleasant 71 y.o. female morbidly obese in NAD. AAO x 3.  Neurovascular Examination: CFT immediate b/l LE. Palpable DP/PT pulses b/l LE. Digital hair sparse b/l. Skin temperature gradient WNL b/l. No pain with calf compression b/l. No edema noted b/l. No cyanosis or clubbing noted b/l LE.  Protective sensation intact 5/5 intact bilaterally with 10g monofilament b/l. Vibratory sensation intact b/l.  Dermatological:  Pedal integument with normal turgor, texture and tone b/l LE. No open wounds b/l. No interdigital macerations b/l.   Toenails 1-5 b/l elongated, thickened, discolored with subungual debris. +Tenderness with dorsal palpation of nailplates. No hyperkeratotic or porokeratotic lesions present.  Musculoskeletal:  Normal muscle strength 5/5 to all lower extremity muscle groups bilaterally. HAV with bunion deformity noted b/l LE. Hammertoe(s) noted to the R 2nd toe. No pain, crepitus or joint limitation noted with ROM b/l LE.  Patient ambulates independently without assistive aids.  Assessment/Plan: 1. Pain due to onychomycosis of toenails of both feet     No orders of the defined types were placed in this encounter.  -Consent given for treatment as described below: -Examined patient. -Continue  supportive shoe gear daily. -Toenails 1-5 b/l were debrided in length and girth with sterile nail nippers and dremel without iatrogenic bleeding.  -Patient/POA to call should there be question/concern in the interim.   Return in about 3 months (around 11/26/2022).  Marzetta Board, DPM

## 2022-08-30 ENCOUNTER — Ambulatory Visit
Admission: RE | Admit: 2022-08-30 | Discharge: 2022-08-30 | Disposition: A | Discharge status: Home / Self Care | Payer: Medicare Other | Source: Ambulatory Visit | Attending: Family Medicine | Admitting: Family Medicine

## 2022-08-30 ENCOUNTER — Other Ambulatory Visit: Payer: Self-pay | Admitting: Family Medicine

## 2022-08-30 DIAGNOSIS — R928 Other abnormal and inconclusive findings on diagnostic imaging of breast: Secondary | ICD-10-CM

## 2022-08-30 DIAGNOSIS — N6320 Unspecified lump in the left breast, unspecified quadrant: Secondary | ICD-10-CM

## 2022-08-30 DIAGNOSIS — R921 Mammographic calcification found on diagnostic imaging of breast: Secondary | ICD-10-CM

## 2022-08-30 DIAGNOSIS — N6321 Unspecified lump in the left breast, upper outer quadrant: Secondary | ICD-10-CM | POA: Diagnosis not present

## 2022-09-02 DIAGNOSIS — Z23 Encounter for immunization: Secondary | ICD-10-CM | POA: Diagnosis not present

## 2022-09-09 ENCOUNTER — Other Ambulatory Visit: Payer: Self-pay | Admitting: Family Medicine

## 2022-09-09 ENCOUNTER — Ambulatory Visit: Payer: Medicare Other | Admitting: Gastroenterology

## 2022-09-09 ENCOUNTER — Other Ambulatory Visit (INDEPENDENT_AMBULATORY_CARE_PROVIDER_SITE_OTHER): Payer: Medicare Other

## 2022-09-09 ENCOUNTER — Other Ambulatory Visit: Payer: Self-pay | Admitting: Gastroenterology

## 2022-09-09 ENCOUNTER — Encounter: Payer: Self-pay | Admitting: *Deleted

## 2022-09-09 VITALS — BP 128/80 | HR 97 | Ht 68.0 in | Wt 293.0 lb

## 2022-09-09 DIAGNOSIS — Z86010 Personal history of colonic polyps: Secondary | ICD-10-CM

## 2022-09-09 DIAGNOSIS — E559 Vitamin D deficiency, unspecified: Secondary | ICD-10-CM

## 2022-09-09 DIAGNOSIS — I10 Essential (primary) hypertension: Secondary | ICD-10-CM

## 2022-09-09 DIAGNOSIS — D509 Iron deficiency anemia, unspecified: Secondary | ICD-10-CM

## 2022-09-09 LAB — IBC + FERRITIN
Ferritin: 10.2 ng/mL (ref 10.0–291.0)
Iron: 52 ug/dL (ref 42–145)
Saturation Ratios: 10.3 % — ABNORMAL LOW (ref 20.0–50.0)
TIBC: 505.4 ug/dL — ABNORMAL HIGH (ref 250.0–450.0)
Transferrin: 361 mg/dL — ABNORMAL HIGH (ref 212.0–360.0)

## 2022-09-09 LAB — VITAMIN D 25 HYDROXY (VIT D DEFICIENCY, FRACTURES): VITD: 17.32 ng/mL — ABNORMAL LOW (ref 30.00–100.00)

## 2022-09-09 MED ORDER — PLENVU 140 G PO SOLR: 1.0000 | ORAL | 0 refills | Status: DC

## 2022-09-09 NOTE — Patient Instructions (Addendum)
Your provider has requested that you go to the basement level for lab work before leaving today. Press "B" on the elevator. The lab is located at the first door on the left as you exit the elevator. ______________________________________________________  Sheila Hartman have been scheduled for a colonoscopy. Please follow written instructions given to you at your visit today.  Please pick up your prep supplies at the pharmacy within the next 1-3 days. If you use inhalers (even only as needed), please bring them with you on the day of your procedure.  _______________________________________________________  If you are age 70 or older, your body mass index should be between 23-30. Your Body mass index is 44.55 kg/m. If this is out of the aforementioned range listed, please consider follow up with your Primary Care Provider.  If you are age 63 or younger, your body mass index should be between 19-25. Your Body mass index is 44.55 kg/m. If this is out of the aformentioned range listed, please consider follow up with your Primary Care Provider.   ________________________________________________________  The [facility] providers would like to encourage you to use [facility] to communicate with providers for non-urgent requests or questions.  Due to long hold times on the telephone, sending your provider a message by [facility] may be a faster and more efficient way to get a response.  Please allow 48 business hours for a response.  Please remember that this is for non-urgent requests.  _______________________________________________________  Due to recent changes in healthcare laws, you may see the results of your imaging and laboratory studies on MyChart before your provider has had a chance to review them.  We understand that in some cases there may be results that are confusing or concerning to you. Not all laboratory results come back in the same time frame and the provider may be waiting for multiple results in  order to interpret others.  Please give [place] 48 hours in order for your provider to thoroughly review all the results before contacting the office for clarification of your results.
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
## 2022-09-14 ENCOUNTER — Other Ambulatory Visit: Payer: Self-pay

## 2022-09-14 ENCOUNTER — Encounter: Payer: Self-pay | Admitting: Gastroenterology

## 2022-09-14 MED ORDER — PEG 3350-KCL-NA BICARB-NACL 420 G PO SOLR: ORAL | 0 refills | Status: DC

## 2022-09-14 MED ORDER — ERGOCALCIFEROL 1.25 MG (50000 UT) PO CAPS: 50000.0000 [IU] | ORAL_CAPSULE | ORAL | 0 refills | Status: DC

## 2022-09-22 ENCOUNTER — Other Ambulatory Visit: Payer: Self-pay | Admitting: Family Medicine

## 2022-09-22 DIAGNOSIS — R921 Mammographic calcification found on diagnostic imaging of breast: Secondary | ICD-10-CM

## 2022-09-27 ENCOUNTER — Ambulatory Visit
Admission: RE | Admit: 2022-09-27 | Discharge: 2022-09-27 | Disposition: A | Discharge status: Home / Self Care | Payer: Medicare Other | Source: Ambulatory Visit | Attending: Family Medicine | Admitting: Family Medicine

## 2022-09-27 DIAGNOSIS — N62 Hypertrophy of breast: Secondary | ICD-10-CM | POA: Diagnosis not present

## 2022-09-27 DIAGNOSIS — D0512 Intraductal carcinoma in situ of left breast: Secondary | ICD-10-CM | POA: Diagnosis not present

## 2022-09-27 DIAGNOSIS — R921 Mammographic calcification found on diagnostic imaging of breast: Secondary | ICD-10-CM | POA: Diagnosis not present

## 2022-09-27 DIAGNOSIS — N6321 Unspecified lump in the left breast, upper outer quadrant: Secondary | ICD-10-CM | POA: Diagnosis not present

## 2022-09-27 DIAGNOSIS — C50919 Malignant neoplasm of unspecified site of unspecified female breast: Secondary | ICD-10-CM

## 2022-09-27 DIAGNOSIS — N6320 Unspecified lump in the left breast, unspecified quadrant: Secondary | ICD-10-CM

## 2022-09-27 HISTORY — PX: BREAST BIOPSY: SHX20

## 2022-09-27 HISTORY — DX: Malignant neoplasm of unspecified site of unspecified female breast: C50.919

## 2022-09-29 ENCOUNTER — Other Ambulatory Visit: Payer: Self-pay | Admitting: Family Medicine

## 2022-09-29 DIAGNOSIS — N6489 Other specified disorders of breast: Secondary | ICD-10-CM

## 2022-10-05 ENCOUNTER — Ambulatory Visit: Payer: Self-pay | Admitting: General Surgery

## 2022-10-05 DIAGNOSIS — D0512 Intraductal carcinoma in situ of left breast: Secondary | ICD-10-CM

## 2022-10-05 MED ORDER — KETOROLAC TROMETHAMINE 15 MG/ML IJ SOLN: 15.0000 mg | Freq: Once | INTRAMUSCULAR | Status: AC

## 2022-10-06 ENCOUNTER — Ambulatory Visit: Payer: Medicare Other | Admitting: Orthopaedic Surgery

## 2022-10-06 VITALS — Ht 68.0 in | Wt 291.0 lb

## 2022-10-06 DIAGNOSIS — G8929 Other chronic pain: Secondary | ICD-10-CM

## 2022-10-06 DIAGNOSIS — M1711 Unilateral primary osteoarthritis, right knee: Secondary | ICD-10-CM | POA: Diagnosis not present

## 2022-10-06 DIAGNOSIS — M25561 Pain in right knee: Secondary | ICD-10-CM

## 2022-10-06 NOTE — Progress Notes (Signed)
The patient is a 72 year old female well-known to me.  She has severe debilitating end-stage arthritis of her right knee.  She is in need of knee replacement surgery.  She has tried and failed all forms of conservative treatment.  She is being seen again today for a weight and BMI assessment.  She is morbidly obese.  She understands that we cannot proceed with any knee replacement surgery safely until her BMI is below 40.  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  She does report that she is having upcoming breast surgery by one of the general surgeons in town.  Her weight does fluctuate.  She feels like she is lost a little.   On exam today her right knee lacks full extension and I cannot even flex her to 90 degrees.  There is varus malalignment that is not really correctable.  She has pain throughout arc of motion of her knee and again previous x-rays show severe end-stage arthritis.  Her BMI calculated in our office today is 44.25.  She understands that we still need her lose significant weight before safely being able to consider knee replacement surgery.  She agrees with that as well.  We will see her back in 3 months for repeat height and weight measurement with a BMI calculation.

## 2022-10-07 ENCOUNTER — Telehealth (HOSPITAL_COMMUNITY): Payer: Self-pay

## 2022-10-07 ENCOUNTER — Other Ambulatory Visit: Payer: Self-pay | Admitting: General Surgery

## 2022-10-07 DIAGNOSIS — D0512 Intraductal carcinoma in situ of left breast: Secondary | ICD-10-CM

## 2022-10-08 ENCOUNTER — Inpatient Hospital Stay: Payer: Medicare Other

## 2022-10-08 ENCOUNTER — Inpatient Hospital Stay: Payer: Medicare Other | Attending: Hematology and Oncology | Admitting: Hematology and Oncology

## 2022-10-08 VITALS — BP 149/91 | HR 102 | Temp 98.3°F | Resp 16 | Ht 68.0 in | Wt 288.4 lb

## 2022-10-08 DIAGNOSIS — Z170 Estrogen receptor positive status [ER+]: Secondary | ICD-10-CM | POA: Insufficient documentation

## 2022-10-08 DIAGNOSIS — D0512 Intraductal carcinoma in situ of left breast: Secondary | ICD-10-CM | POA: Diagnosis not present

## 2022-10-08 DIAGNOSIS — D0510 Intraductal carcinoma in situ of unspecified breast: Secondary | ICD-10-CM | POA: Insufficient documentation

## 2022-10-08 DIAGNOSIS — Z79899 Other long term (current) drug therapy: Secondary | ICD-10-CM | POA: Diagnosis not present
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
## 2022-10-08 NOTE — Assessment & Plan Note (Signed)
This is a very pleasant 72 year old postmenopausal female patient with newly diagnosed left breast DCIS, ER/PR positive referred to medical oncology for recommendations.  She is currently scheduled for surgery mid February.  At baseline she has well-controlled hypertension, otherwise denies any major health issues.  Physical examination today with post biopsy changes, large hematoma and ecchymosis.  I have today reviewed the following details about DCIS and role of antiestrogen therapy.  There is a questionable area of microinvasion  Pathology review: I discussed with the patient the difference between DCIS and invasive breast cancer. It is considered a precancerous lesion. DCIS is classified as a Stage 0 breast cancer. It is generally detected through mammograms as calcifications. We discussed the significance of grades and its impact on prognosis. We also discussed the importance of ER and PR receptors and their implications to adjuvant treatment options. Prognosis of DCIS dependence on grade and degree of comedo necrosis. It is anticipated that if not treated, 20-30% of DCIS can develop into invasive breast cancer.  Recommendation: 1. Breast conserving surgery 2. Followed by adjuvant radiation therapy 3. Followed by antiestrogen therapy with tamoxifen/aromatase inhibitors based on menopausal status 5 years  Tamoxifen counseling: We discussed the risks and benefits of tamoxifen. These include but not limited to insomnia, hot flashes, mood changes, vaginal dryness, and weight gain. Although rare, serious side effects including endometrial cancer, risk of blood clots were also discussed. We strongly believe that the benefits far outweigh the risks. Patient understands these risks and consented to starting treatment. Planned treatment duration is 5 years.  Aromatase inhibitors counseling: We have discussed the mechanism of action of aromatase inhibitors today.  We have discussed adverse effects including  but not limited to menopausal symptoms, increased risk of osteoporosis and fractures, cardiovascular events, arthralgias and myalgias.  We do believe that the benefits far outweigh the risks.  Plan treatment duration of 5 years.  She is leaning towards aromatase inhibitors at this time.  She will return to clinic after surgery to review final pathology and to discuss adjuvant recommendations once again.

## 2022-10-11 ENCOUNTER — Ambulatory Visit
Admission: RE | Admit: 2022-10-11 | Discharge: 2022-10-11 | Disposition: A | Discharge status: Home / Self Care | Payer: Medicare Other | Source: Ambulatory Visit | Attending: Family Medicine | Admitting: Family Medicine

## 2022-10-11 ENCOUNTER — Other Ambulatory Visit: Payer: Self-pay | Admitting: Family Medicine

## 2022-10-11 DIAGNOSIS — N6321 Unspecified lump in the left breast, upper outer quadrant: Secondary | ICD-10-CM | POA: Diagnosis not present

## 2022-10-11 DIAGNOSIS — S065XAA Traumatic subdural hemorrhage with loss of consciousness status unknown, initial encounter: Secondary | ICD-10-CM

## 2022-10-11 DIAGNOSIS — R921 Mammographic calcification found on diagnostic imaging of breast: Secondary | ICD-10-CM | POA: Diagnosis not present

## 2022-10-11 DIAGNOSIS — N6489 Other specified disorders of breast: Secondary | ICD-10-CM
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
## 2022-10-11 NOTE — Progress Notes (Signed)
New Breast Cancer Diagnosis: Left Breast UOQ    Did patient present with symptoms (if so, please note symptoms) or screening mammography?: Screening mammogram showed possible asymmetry and adjacent coarse heterogenous calcifications in the left breast.   Location and Extent of disease :left breast. Located at 2 o'clock position, measured 7 x 9 x 4 mm in greatest dimension. Adenopathy no.  Histology per Pathology Report: grade 2, DCIS 09/27/2022  Receptor Status: ER(positive), PR (positive), Her2-neu (), Ki-(%)   Surgeon and surgical plan, if any:  Dr. Marlou Starks -Left Breast Lumpectomy with radioactive seed localization-Surgery date will be pushed back, no date as of yet.   Medical oncologist, treatment if any:   Dr. Chryl Heck 10/08/2022 -Recommendation: 1. Breast conserving surgery 2. Followed by adjuvant radiation therapy 3. Followed by antiestrogen therapy with tamoxifen/aromatase inhibitors based on menopausal status 5 years   Family History of Breast/Ovarian/Prostate Cancer: Paternal Aunt had breast cancer.  Lymphedema issues, if any: She continues to have a hematoma.  Does not need to be drained at this time.    Pain issues, if any: No    SAFETY ISSUES: Prior radiation? No Pacemaker/ICD? No Possible current pregnancy? Hysterectomy Is the patient on methotrexate? No  Current Complaints / other details:

## 2022-10-12 ENCOUNTER — Ambulatory Visit
Admission: RE | Admit: 2022-10-12 | Discharge: 2022-10-12 | Disposition: A | Discharge status: Home / Self Care | Payer: Medicare Other | Source: Ambulatory Visit | Attending: Radiation Oncology | Admitting: Radiation Oncology

## 2022-10-12 ENCOUNTER — Telehealth: Payer: Self-pay | Admitting: Radiation Oncology

## 2022-10-12 ENCOUNTER — Encounter: Payer: Self-pay | Admitting: Radiation Oncology

## 2022-10-12 ENCOUNTER — Other Ambulatory Visit: Payer: Self-pay

## 2022-10-12 VITALS — BP 151/80 | HR 93 | Temp 96.3°F | Resp 18 | Ht 68.0 in | Wt 288.5 lb

## 2022-10-12 DIAGNOSIS — Z87891 Personal history of nicotine dependence: Secondary | ICD-10-CM | POA: Diagnosis not present

## 2022-10-12 DIAGNOSIS — Z79899 Other long term (current) drug therapy: Secondary | ICD-10-CM | POA: Diagnosis not present

## 2022-10-12 DIAGNOSIS — Z86010 Personal history of colonic polyps: Secondary | ICD-10-CM | POA: Insufficient documentation

## 2022-10-12 DIAGNOSIS — Z803 Family history of malignant neoplasm of breast: Secondary | ICD-10-CM | POA: Insufficient documentation

## 2022-10-12 DIAGNOSIS — Z800 Family history of malignant neoplasm of digestive organs: Secondary | ICD-10-CM | POA: Diagnosis not present

## 2022-10-12 DIAGNOSIS — D0512 Intraductal carcinoma in situ of left breast: Secondary | ICD-10-CM

## 2022-10-12 DIAGNOSIS — Z8673 Personal history of transient ischemic attack (TIA), and cerebral infarction without residual deficits: Secondary | ICD-10-CM | POA: Diagnosis not present

## 2022-10-12 DIAGNOSIS — Z170 Estrogen receptor positive status [ER+]: Secondary | ICD-10-CM | POA: Insufficient documentation

## 2022-10-12 DIAGNOSIS — I10 Essential (primary) hypertension: Secondary | ICD-10-CM | POA: Diagnosis not present

## 2022-10-12 DIAGNOSIS — K219 Gastro-esophageal reflux disease without esophagitis: Secondary | ICD-10-CM | POA: Insufficient documentation

## 2022-10-12 DIAGNOSIS — M129 Arthropathy, unspecified: Secondary | ICD-10-CM | POA: Insufficient documentation

## 2022-10-12 DIAGNOSIS — D649 Anemia, unspecified: Secondary | ICD-10-CM | POA: Diagnosis not present

## 2022-10-12 NOTE — Addendum Note (Signed)
Encounter addended by: Hayden Pedro, PA-C on: 10/12/2022 2:26 PM  Actions taken: Clinical Note Signed

## 2022-10-12 NOTE — Telephone Encounter (Signed)
I spoke with genetics and she had negative testing in 2021 and does not need updates to this at this time. I called and let the patient know but had to leave her a voicemail with the message.

## 2022-10-14 ENCOUNTER — Inpatient Hospital Stay: Payer: Medicare Other | Admitting: Licensed Clinical Social Worker

## 2022-10-14 ENCOUNTER — Encounter: Payer: Self-pay | Admitting: Gastroenterology

## 2022-10-14 ENCOUNTER — Encounter: Payer: Self-pay | Admitting: *Deleted

## 2022-10-14 DIAGNOSIS — D0512 Intraductal carcinoma in situ of left breast: Secondary | ICD-10-CM

## 2022-10-14 NOTE — Progress Notes (Signed)
Sheila Hartman  Initial Assessment   Sheila Hartman is a 72 y.o. year old female contacted by phone. Clinical Social Hartman was referred by new patient protocol for assessment of psychosocial needs.   SDOH (Social Determinants of Health) assessments performed: Yes SDOH Interventions    Flowsheet Row Clinical Support from 04/01/2022 in [facility] [place] from 03/10/2021 in [facility] at [place] Interventions Intervention Not Indicated Intervention Not Indicated  Housing Interventions Intervention Not Indicated Intervention Not Indicated  Transportation Interventions Intervention Not Indicated Intervention Not Indicated  Financial Strain Interventions Intervention Not Indicated Intervention Not Indicated  Physical Activity Interventions Intervention Not Indicated Intervention Not Indicated  Stress Interventions Intervention Not Indicated Intervention Not Indicated  Social Connections Interventions Intervention Not Indicated Intervention Not Indicated       SDOH Screenings   Food Insecurity: No Food Insecurity (10/12/2022)  Housing: Low Risk  (10/12/2022)  Transportation Needs: No Transportation Needs (10/12/2022)  Utilities: Not At Risk (10/12/2022)  Alcohol Screen: Low Risk  (04/01/2022)  Depression (PHQ2-9): Low Risk  (10/12/2022)  Financial Resource Strain: Low Risk  (04/01/2022)  Physical Activity: Insufficiently Active (04/01/2022)  Social Connections: Moderately Integrated (04/01/2022)  Stress: No Stress Concern Present (04/01/2022)  Tobacco Use: Medium Risk (10/12/2022)     Distress Screen completed: No     No data to display            Family/Social Information:  Housing Arrangement: patient lives with 59 yo niece whom she raised Family members/support persons in your life? Family, Friends, and Music therapist concerns: no  Income source: Risk analyst concerns: No Type of concern: None Food access concerns: no Religious or spiritual practice: Yes-trusts in her faith and is involved in a Public house manager Currently in place:    Coping/ Adjustment to diagnosis: Patient understands treatment plan and what happens next? yes, ready to move forward and have surgery and radiation. Is not looking forward to daily treatments due to negative memories with [place] due to both brothers dying from cancer but will do the treatments needed Concerns about diagnosis and/or treatment: I'm not especially worried about anything Patient enjoys  being on the move and staying busy. Watches 72yo Current coping skills/ strengths: Capable of independent living , Armed forces logistics/support/administrative officer , Motivation for treatment/growth , Physical Health , Religious Affiliation , and Supportive family/friends     SUMMARY: Current SDOH Barriers:  No significant barriers noted today  Clinical Social Hartman Clinical Goal(s):  No clinical social Hartman goals at this time  Interventions: Discussed common feeling and emotions when being diagnosed with cancer, and the importance of support during treatment Informed patient of the support team roles and support services at [facility] Provided [place] contact information and encouraged patient to call with any questions or concerns   Follow Up Plan: Patient will contact CSW with any support or resource needs Patient verbalizes understanding of plan: Yes    Sheila Cygan E Devontaye Ground, LCSW

## 2022-10-15 ENCOUNTER — Telehealth: Payer: Self-pay | Admitting: Gastroenterology

## 2022-10-15 ENCOUNTER — Other Ambulatory Visit: Payer: Self-pay

## 2022-10-15 MED ORDER — PEG 3350-KCL-NA BICARB-NACL 420 G PO SOLR: ORAL | 0 refills | Status: DC

## 2022-10-15 NOTE — Telephone Encounter (Signed)
Patient is calling wondering when her prep medication will be ready for pick up. Please advise

## 2022-10-15 NOTE — Telephone Encounter (Signed)
Contacted the patient. Confirmed her pharmacy as [facility] the prescription.

## 2022-10-19 ENCOUNTER — Encounter: Payer: Medicare Other | Admitting: Gastroenterology

## 2022-10-20 ENCOUNTER — Telehealth: Payer: Self-pay | Admitting: Gastroenterology

## 2022-10-20 DIAGNOSIS — D0512 Intraductal carcinoma in situ of left breast: Secondary | ICD-10-CM | POA: Diagnosis not present

## 2022-10-20 DIAGNOSIS — Z170 Estrogen receptor positive status [ER+]: Secondary | ICD-10-CM | POA: Diagnosis not present

## 2022-10-20 DIAGNOSIS — Z79899 Other long term (current) drug therapy: Secondary | ICD-10-CM | POA: Diagnosis not present

## 2022-10-20 NOTE — Telephone Encounter (Signed)
Inbound call from patient, states she would not like to purchase a complete box od Dulcolax if she were to only need four pills. Patient is requesting alternative. Please advise.

## 2022-10-22 ENCOUNTER — Encounter: Payer: Self-pay | Admitting: Gastroenterology

## 2022-10-22 ENCOUNTER — Ambulatory Visit (AMBULATORY_SURGERY_CENTER): Payer: Medicare Other | Admitting: Gastroenterology

## 2022-10-22 VITALS — BP 105/65 | HR 79 | Temp 96.8°F | Resp 10 | Ht 68.0 in | Wt 293.0 lb

## 2022-10-22 DIAGNOSIS — D500 Iron deficiency anemia secondary to blood loss (chronic): Secondary | ICD-10-CM | POA: Diagnosis not present

## 2022-10-22 DIAGNOSIS — D123 Benign neoplasm of transverse colon: Secondary | ICD-10-CM | POA: Diagnosis not present

## 2022-10-22 DIAGNOSIS — J45909 Unspecified asthma, uncomplicated: Secondary | ICD-10-CM | POA: Diagnosis not present

## 2022-10-22 DIAGNOSIS — D509 Iron deficiency anemia, unspecified: Secondary | ICD-10-CM | POA: Diagnosis not present

## 2022-10-22 DIAGNOSIS — K2970 Gastritis, unspecified, without bleeding: Secondary | ICD-10-CM | POA: Diagnosis not present

## 2022-10-22 MED ORDER — SODIUM CHLORIDE 0.9 % IV SOLN: 500.0000 mL | Freq: Once | INTRAVENOUS | Status: DC

## 2022-10-22 MED ORDER — OMEPRAZOLE 40 MG PO CPDR: 40.0000 mg | DELAYED_RELEASE_CAPSULE | Freq: Every day | ORAL | 3 refills | Status: DC

## 2022-10-22 NOTE — Progress Notes (Signed)
Pt's states no medical or surgical changes since previsit or office visit. VS assessed by D.T 

## 2022-10-22 NOTE — Progress Notes (Signed)
To pacu VSS. Report to [email or web address].tb 
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
## 2022-10-22 NOTE — Op Note (Signed)
[facility] Patient Name: Sheila Hartman Procedure Date: 10/22/2022 2:45 PM MRN: [id number] Endoscopist: Mauri Pole , MD, [id number] Age: 72 Referring MD:  Date of Birth: 1951-03-11 Gender: Female Account #: [id number] Procedure:                Colonoscopy Indications:              Unexplained iron deficiency anemia Medicines:                Monitored Anesthesia Care Procedure:                Pre-Anesthesia Assessment:                           - Prior to the procedure, a History and Physical                            was performed, and patient medications and                            allergies were reviewed. The patient's tolerance of                            previous anesthesia was also reviewed. The risks                            and benefits of the procedure and the sedation                            options and risks were discussed with the patient.                            All questions were answered, and informed consent                            was obtained. Prior Anticoagulants: The patient has                            taken no anticoagulant or antiplatelet agents. ASA                            Grade Assessment: III - A patient with severe                            systemic disease. After reviewing the risks and                            benefits, the patient was deemed in satisfactory                            condition to undergo the procedure.                           After obtaining informed consent, the colonoscope  was passed under direct vision. Throughout the                            procedure, the patient's blood pressure, pulse, and                            oxygen saturations were monitored continuously. The                            Olympus PCF-H190DL (#[id number]) Colonoscope was                            introduced through the anus and advanced to the the                            cecum,  identified by appendiceal orifice and                            ileocecal valve. The colonoscopy was performed                            without difficulty. The patient tolerated the                            procedure well. The quality of the bowel                            preparation was good. The ileocecal valve,                            appendiceal orifice, and rectum were photographed. Scope In: 3:00:45 PM Scope Out: 3:18:18 PM Scope Withdrawal Time: 0 hours 8 minutes 28 seconds  Total Procedure Duration: 0 hours 17 minutes 33 seconds  Findings:                 The perianal and digital rectal examinations were                            normal.                           Two sessile polyps were found in the transverse                            colon and hepatic flexure. The polyps were 4 to 6                            mm in size. These polyps were removed with a cold                            snare. Resection and retrieval were complete.                           Scattered small-mouthed diverticula were found in  the sigmoid colon, descending colon, transverse                            colon and ascending colon.                           Non-bleeding external and internal hemorrhoids were                            found during retroflexion. The hemorrhoids were                            medium-sized. Complications:            No immediate complications. Estimated Blood Loss:     Estimated blood loss was minimal. Impression:               - Two 4 to 6 mm polyps in the transverse colon and                            at the hepatic flexure, removed with a cold snare.                            Resected and retrieved.                           - Diverticulosis in the sigmoid colon, in the                            descending colon, in the transverse colon and in                            the ascending colon.                           -  Non-bleeding external and internal hemorrhoids. Recommendation:           - Patient has a contact number available for                            emergencies. The signs and symptoms of potential                            delayed complications were discussed with the                            patient. Return to normal activities tomorrow.                            Written discharge instructions were provided to the                            patient.                           - Resume previous diet.                           -  Continue present medications.                           - Await pathology results.                           - Repeat colonoscopy in 5 years for surveillance                            based on pathology results. (H/o multiple advanced                            adenomatous colon polyps) Mauri Pole, MD 10/22/2022 3:27:40 PM This report has been signed electronically.

## 2022-10-22 NOTE — Patient Instructions (Addendum)
Handout on polyps and diverticulosis given.  Resume previous diet, and continue current medications.     YOU HAD AN ENDOSCOPIC PROCEDURE TODAY AT [facility]:   Refer to the procedure report that was given to you for any specific questions about what was found during the examination.  If the procedure report does not answer your questions, please call your gastroenterologist to clarify.  If you requested that your care partner not be given the details of your procedure findings, then the procedure report has been included in a sealed envelope for you to review at your convenience later.  YOU SHOULD EXPECT: Some feelings of bloating in the abdomen. Passage of more gas than usual.  Walking can help get rid of the air that was put into your GI tract during the procedure and reduce the bloating. If you had a lower endoscopy (such as a colonoscopy or flexible sigmoidoscopy) you may notice spotting of blood in your stool or on the toilet paper. If you underwent a bowel prep for your procedure, you may not have a normal bowel movement for a few days.  Please Note:  You might notice some irritation and congestion in your nose or some drainage.  This is from the oxygen used during your procedure.  There is no need for concern and it should clear up in a day or so.  SYMPTOMS TO REPORT IMMEDIATELY:  Following lower endoscopy (colonoscopy or flexible sigmoidoscopy):  Excessive amounts of blood in the stool  Significant tenderness or worsening of abdominal pains  Swelling of the abdomen that is new, acute  Fever of 100F or higher  Following upper endoscopy (EGD)  Vomiting of blood or coffee ground material  New chest pain or pain under the shoulder blades  Painful or persistently difficult swallowing  New shortness of breath  Fever of 100F or higher  Black, tarry-looking stools  For urgent or emergent issues, a gastroenterologist can be reached at any hour by calling [phone]  [phone]. Do not use MyChart messaging for urgent concerns.    DIET:  We do recommend a small meal at first, but then you may proceed to your regular diet.  Drink plenty of fluids but you should avoid alcoholic beverages for 24 hours.  ACTIVITY:  You should plan to take it easy for the rest of today and you should NOT DRIVE or use heavy machinery until tomorrow (because of the sedation medicines used during the test).    FOLLOW UP: Our staff will call the number listed on your records the next business day following your procedure.  We will call around 7:15- 8:00 am to check on you and address any questions or concerns that you may have regarding the information given to you following your procedure. If we do not reach you, we will leave a message.     If any biopsies were taken you will be contacted by phone or by letter within the next 1-3 weeks.  Please call us at [phone] if you have not heard about the biopsies in 3 weeks.    SIGNATURES/CONFIDENTIALITY: You and/or your care partner have signed paperwork which will be entered into your electronic medical record.  These signatures attest to the fact that that the information above on your After Visit Summary has been reviewed and is understood.  Full responsibility of the confidentiality of this discharge information lies with you and/or your care-partner.

## 2022-10-22 NOTE — Progress Notes (Signed)
Called to room to assist during endoscopic procedure.  Patient ID and intended procedure confirmed with present staff. Received instructions for my participation in the procedure from the performing physician.  

## 2022-10-22 NOTE — Op Note (Signed)
[facility] Patient Name: Sheila Hartman Procedure Date: 10/22/2022 2:46 PM MRN: [id number] Endoscopist: Mauri Pole , MD, [id number] Age: 72 Referring MD:  Date of Birth: 1951/06/26 Gender: Female Account #: [id number] Procedure:                Upper GI endoscopy Indications:              Suspected upper gastrointestinal bleeding in                            patient with unexplained iron deficiency anemia Medicines:                Monitored Anesthesia Care Procedure:                Pre-Anesthesia Assessment:                           - Prior to the procedure, a History and Physical                            was performed, and patient medications and                            allergies were reviewed. The patient's tolerance of                            previous anesthesia was also reviewed. The risks                            and benefits of the procedure and the sedation                            options and risks were discussed with the patient.                            All questions were answered, and informed consent                            was obtained. Prior Anticoagulants: The patient has                            taken no anticoagulant or antiplatelet agents. ASA                            Grade Assessment: III - A patient with severe                            systemic disease. After reviewing the risks and                            benefits, the patient was deemed in satisfactory                            condition to undergo the procedure.  After obtaining informed consent, the endoscope was                            passed under direct vision. Throughout the                            procedure, the patient's blood pressure, pulse, and                            oxygen saturations were monitored continuously. The                            GIF [id number] #[id number] was introduced through the                             mouth, and advanced to the second part of duodenum.                            The upper GI endoscopy was accomplished without                            difficulty. The patient tolerated the procedure                            well. Scope In: Scope Out: Findings:                 The Z-line was regular and was found 38 cm from the                            incisors.                           The exam of the esophagus was otherwise normal.                           A 2 cm hiatal hernia was present.                           Patchy mild inflammation characterized by                            congestion (edema), erosions and erythema was found                            in the gastric antrum and in the prepyloric region                            of the stomach. Biopsies were taken with a cold                            forceps for histology. Biopsies were taken with a                            cold  forceps for Helicobacter pylori testing.                           The examined duodenum was normal. Complications:            No immediate complications. Estimated Blood Loss:     Estimated blood loss was minimal. Impression:               - Z-line regular, 38 cm from the incisors.                           - 2 cm hiatal hernia.                           - Gastritis. Biopsied.                           - Normal examined duodenum. Recommendation:           - Patient has a contact number available for                            emergencies. The signs and symptoms of potential                            delayed complications were discussed with the                            patient. Return to normal activities tomorrow.                            Written discharge instructions were provided to the                            patient.                           - Resume previous diet.                           - Continue present medications.                           - Await pathology  results.                           - Follow an antireflux regimen.                           - Use Prilosec (omeprazole) 40 mg PO daily. Rx for                            90 days with 3 refills Mauri Pole, MD 10/22/2022 3:31:09 PM This report has been signed electronically.

## 2022-10-25 ENCOUNTER — Telehealth: Payer: Self-pay

## 2022-10-25 ENCOUNTER — Other Ambulatory Visit: Payer: Self-pay | Admitting: Family Medicine

## 2022-10-25 ENCOUNTER — Other Ambulatory Visit: Payer: Self-pay

## 2022-10-25 ENCOUNTER — Ambulatory Visit
Admission: RE | Admit: 2022-10-25 | Discharge: 2022-10-25 | Disposition: A | Discharge status: Home / Self Care | Payer: Medicare Other | Source: Ambulatory Visit | Attending: Family Medicine | Admitting: Family Medicine

## 2022-10-25 ENCOUNTER — Encounter (HOSPITAL_BASED_OUTPATIENT_CLINIC_OR_DEPARTMENT_OTHER): Payer: Self-pay | Admitting: General Surgery

## 2022-10-25 DIAGNOSIS — S065XAA Traumatic subdural hemorrhage with loss of consciousness status unknown, initial encounter: Secondary | ICD-10-CM

## 2022-10-25 DIAGNOSIS — R921 Mammographic calcification found on diagnostic imaging of breast: Secondary | ICD-10-CM | POA: Diagnosis not present

## 2022-10-25 DIAGNOSIS — D0512 Intraductal carcinoma in situ of left breast: Secondary | ICD-10-CM | POA: Diagnosis not present

## 2022-10-25 NOTE — Telephone Encounter (Signed)
  Follow up Call-     10/22/2022    1:52 PM  Call back number  Post procedure Call Back phone  # [phone]  Permission to leave phone message Yes     Patient questions:  Do you have a fever, pain , or abdominal swelling? No. Pain Score  0 *  Have you tolerated food without any problems? Yes.    Have you been able to return to your normal activities? Yes.    Do you have any questions about your discharge instructions: Diet   No. Medications  No. Follow up visit  No.  Do you have questions or concerns about your Care? No.  Actions: * If pain score is 4 or above: No action needed, pain <4.

## 2022-10-27 ENCOUNTER — Encounter (HOSPITAL_BASED_OUTPATIENT_CLINIC_OR_DEPARTMENT_OTHER)
Admission: RE | Admit: 2022-10-27 | Discharge: 2022-10-27 | Disposition: A | Discharge status: Home / Self Care | Payer: Medicare Other | Source: Ambulatory Visit | Attending: General Surgery | Admitting: General Surgery

## 2022-10-27 NOTE — Progress Notes (Signed)

## 2022-10-29 ENCOUNTER — Encounter: Payer: Self-pay | Admitting: *Deleted

## 2022-10-29 ENCOUNTER — Ambulatory Visit
Admission: RE | Admit: 2022-10-29 | Discharge: 2022-10-29 | Disposition: A | Discharge status: Home / Self Care | Payer: Medicare Other | Source: Ambulatory Visit | Attending: General Surgery | Admitting: General Surgery

## 2022-10-29 DIAGNOSIS — D0512 Intraductal carcinoma in situ of left breast: Secondary | ICD-10-CM

## 2022-10-29 DIAGNOSIS — R921 Mammographic calcification found on diagnostic imaging of breast: Secondary | ICD-10-CM | POA: Diagnosis not present

## 2022-10-29 HISTORY — PX: BREAST BIOPSY: SHX20

## 2022-11-01 ENCOUNTER — Ambulatory Visit
Admission: RE | Admit: 2022-11-01 | Discharge: 2022-11-01 | Disposition: A | Discharge status: Home / Self Care | Payer: Medicare Other | Source: Ambulatory Visit | Attending: General Surgery | Admitting: General Surgery

## 2022-11-01 ENCOUNTER — Ambulatory Visit (HOSPITAL_BASED_OUTPATIENT_CLINIC_OR_DEPARTMENT_OTHER)
Admission: RE | Admit: 2022-11-01 | Discharge: 2022-11-01 | Disposition: A | Discharge status: Home / Self Care | Payer: Medicare Other | Attending: General Surgery | Admitting: General Surgery

## 2022-11-01 ENCOUNTER — Ambulatory Visit (HOSPITAL_BASED_OUTPATIENT_CLINIC_OR_DEPARTMENT_OTHER): Payer: Medicare Other | Admitting: Anesthesiology

## 2022-11-01 ENCOUNTER — Other Ambulatory Visit: Payer: Self-pay

## 2022-11-01 ENCOUNTER — Encounter (HOSPITAL_BASED_OUTPATIENT_CLINIC_OR_DEPARTMENT_OTHER)
Admission: RE | Disposition: A | Discharge status: Home / Self Care | Payer: Self-pay | Source: Home / Self Care | Attending: General Surgery

## 2022-11-01 ENCOUNTER — Encounter (HOSPITAL_BASED_OUTPATIENT_CLINIC_OR_DEPARTMENT_OTHER): Payer: Self-pay | Admitting: General Surgery

## 2022-11-01 DIAGNOSIS — K219 Gastro-esophageal reflux disease without esophagitis: Secondary | ICD-10-CM | POA: Diagnosis not present

## 2022-11-01 DIAGNOSIS — Z79899 Other long term (current) drug therapy: Secondary | ICD-10-CM | POA: Insufficient documentation

## 2022-11-01 DIAGNOSIS — Z170 Estrogen receptor positive status [ER+]: Secondary | ICD-10-CM | POA: Insufficient documentation

## 2022-11-01 DIAGNOSIS — I10 Essential (primary) hypertension: Secondary | ICD-10-CM | POA: Diagnosis not present

## 2022-11-01 DIAGNOSIS — J45909 Unspecified asthma, uncomplicated: Secondary | ICD-10-CM

## 2022-11-01 DIAGNOSIS — D0512 Intraductal carcinoma in situ of left breast: Secondary | ICD-10-CM | POA: Diagnosis not present

## 2022-11-01 DIAGNOSIS — Z803 Family history of malignant neoplasm of breast: Secondary | ICD-10-CM | POA: Diagnosis not present

## 2022-11-01 DIAGNOSIS — Z87891 Personal history of nicotine dependence: Secondary | ICD-10-CM | POA: Diagnosis not present

## 2022-11-01 DIAGNOSIS — Z8673 Personal history of transient ischemic attack (TIA), and cerebral infarction without residual deficits: Secondary | ICD-10-CM | POA: Diagnosis not present

## 2022-11-01 DIAGNOSIS — R928 Other abnormal and inconclusive findings on diagnostic imaging of breast: Secondary | ICD-10-CM | POA: Diagnosis not present

## 2022-11-01 DIAGNOSIS — D242 Benign neoplasm of left breast: Secondary | ICD-10-CM | POA: Diagnosis not present

## 2022-11-01 HISTORY — PX: BREAST LUMPECTOMY WITH RADIOACTIVE SEED LOCALIZATION: SHX6424

## 2022-11-01 SURGERY — BREAST LUMPECTOMY WITH RADIOACTIVE SEED LOCALIZATION
Anesthesia: General | Site: Breast | Laterality: Left

## 2022-11-01 MED ORDER — PROPOFOL 10 MG/ML IV BOLUS
INTRAVENOUS | Status: DC | PRN
  Administered 2022-11-01: 200 mg via INTRAVENOUS

## 2022-11-01 MED ORDER — OXYCODONE HCL 5 MG PO TABS: 5.0000 mg | ORAL_TABLET | Freq: Once | ORAL | Status: DC | PRN

## 2022-11-01 MED ORDER — ACETAMINOPHEN 500 MG PO TABS: 1000.0000 mg | ORAL_TABLET | Freq: Once | ORAL | Status: DC

## 2022-11-01 MED ORDER — VANCOMYCIN HCL 1500 MG/300ML IV SOLN
1500.0000 mg | INTRAVENOUS | Status: AC
  Administered 2022-11-01: 1500 mg via INTRAVENOUS
  Filled 2022-11-01: qty 300

## 2022-11-01 MED ORDER — PHENYLEPHRINE HCL (PRESSORS) 10 MG/ML IV SOLN
INTRAVENOUS | Status: DC | PRN
  Administered 2022-11-01: 80 ug via INTRAVENOUS
  Administered 2022-11-01 (×2): 160 ug via INTRAVENOUS
  Administered 2022-11-01 (×3): 80 ug via INTRAVENOUS
  Administered 2022-11-01: 160 ug via INTRAVENOUS

## 2022-11-01 MED ORDER — ONDANSETRON HCL 4 MG/2ML IJ SOLN
INTRAMUSCULAR | Status: DC | PRN
  Administered 2022-11-01: 4 mg via INTRAVENOUS

## 2022-11-01 MED ORDER — EPHEDRINE SULFATE (PRESSORS) 50 MG/ML IJ SOLN
INTRAMUSCULAR | Status: DC | PRN
  Administered 2022-11-01: 10 mg via INTRAVENOUS

## 2022-11-01 MED ORDER — HYDROMORPHONE HCL 1 MG/ML IJ SOLN: 0.2500 mg | INTRAMUSCULAR | Status: DC | PRN

## 2022-11-01 MED ORDER — FENTANYL CITRATE (PF) 100 MCG/2ML IJ SOLN
INTRAMUSCULAR | Status: DC | PRN
  Administered 2022-11-01: 50 ug via INTRAVENOUS

## 2022-11-01 MED ORDER — ONDANSETRON HCL 4 MG/2ML IJ SOLN: 4.0000 mg | Freq: Once | INTRAMUSCULAR | Status: DC | PRN

## 2022-11-01 MED ORDER — AMISULPRIDE (ANTIEMETIC) 5 MG/2ML IV SOLN
10.0000 mg | Freq: Once | INTRAVENOUS | Status: AC | PRN
  Administered 2022-11-01: 10 mg via INTRAVENOUS

## 2022-11-01 MED ORDER — VANCOMYCIN HCL IN DEXTROSE 1-5 GM/200ML-% IV SOLN
INTRAVENOUS | Status: AC
  Filled 2022-11-01: qty 200

## 2022-11-01 MED ORDER — LIDOCAINE HCL (CARDIAC) PF 100 MG/5ML IV SOSY
PREFILLED_SYRINGE | INTRAVENOUS | Status: DC | PRN
  Administered 2022-11-01: 60 mg via INTRAVENOUS

## 2022-11-01 MED ORDER — KETOROLAC TROMETHAMINE 30 MG/ML IJ SOLN
INTRAMUSCULAR | Status: DC | PRN
  Administered 2022-11-01: 15 mg via INTRAVENOUS

## 2022-11-01 MED ORDER — OXYCODONE HCL 5 MG PO TABS: 5.0000 mg | ORAL_TABLET | Freq: Four times a day (QID) | ORAL | 0 refills | Status: DC | PRN

## 2022-11-01 MED ORDER — ACETAMINOPHEN 500 MG PO TABS
1000.0000 mg | ORAL_TABLET | ORAL | Status: AC
  Administered 2022-11-01: 1000 mg via ORAL

## 2022-11-01 MED ORDER — GABAPENTIN 300 MG PO CAPS
300.0000 mg | ORAL_CAPSULE | ORAL | Status: AC
  Administered 2022-11-01: 300 mg via ORAL

## 2022-11-01 MED ORDER — BUPIVACAINE HCL (PF) 0.25 % IJ SOLN
INTRAMUSCULAR | Status: DC | PRN
  Administered 2022-11-01: 20 mL

## 2022-11-01 MED ORDER — LACTATED RINGERS IV SOLN: INTRAVENOUS | Status: DC

## 2022-11-01 MED ORDER — GABAPENTIN 300 MG PO CAPS
ORAL_CAPSULE | ORAL | Status: AC
  Filled 2022-11-01: qty 1

## 2022-11-01 MED ORDER — ONDANSETRON HCL 4 MG/2ML IJ SOLN
INTRAMUSCULAR | Status: AC
  Filled 2022-11-01: qty 2

## 2022-11-01 MED ORDER — DEXAMETHASONE SODIUM PHOSPHATE 4 MG/ML IJ SOLN
INTRAMUSCULAR | Status: DC | PRN
  Administered 2022-11-01: 5 mg via INTRAVENOUS

## 2022-11-01 MED ORDER — AMISULPRIDE (ANTIEMETIC) 5 MG/2ML IV SOLN
INTRAVENOUS | Status: AC
  Filled 2022-11-01: qty 4

## 2022-11-01 MED ORDER — VANCOMYCIN HCL 500 MG/100ML IV SOLN
INTRAVENOUS | Status: AC
  Filled 2022-11-01: qty 100

## 2022-11-01 MED ORDER — FENTANYL CITRATE (PF) 100 MCG/2ML IJ SOLN
INTRAMUSCULAR | Status: AC
  Filled 2022-11-01: qty 2

## 2022-11-01 MED ORDER — MIDAZOLAM HCL 5 MG/5ML IJ SOLN
INTRAMUSCULAR | Status: DC | PRN
  Administered 2022-11-01: 1 mg via INTRAVENOUS

## 2022-11-01 MED ORDER — OXYCODONE HCL 5 MG/5ML PO SOLN: 5.0000 mg | Freq: Once | ORAL | Status: DC | PRN

## 2022-11-01 MED ORDER — CHLORHEXIDINE GLUCONATE CLOTH 2 % EX PADS: 6.0000 | MEDICATED_PAD | Freq: Once | CUTANEOUS | Status: DC

## 2022-11-01 MED ORDER — MIDAZOLAM HCL 2 MG/2ML IJ SOLN
INTRAMUSCULAR | Status: AC
  Filled 2022-11-01: qty 2

## 2022-11-01 MED ORDER — ACETAMINOPHEN 500 MG PO TABS
ORAL_TABLET | ORAL | Status: AC
  Filled 2022-11-01: qty 2

## 2022-11-01 SURGICAL SUPPLY — 42 items
ADH SKN CLS APL DERMABOND .7 (GAUZE/BANDAGES/DRESSINGS) ×1
APL PRP STRL LF DISP 70% ISPRP (MISCELLANEOUS) ×1
APPLIER CLIP 9.375 MED OPEN (MISCELLANEOUS)
APR CLP MED 9.3 20 MLT OPN (MISCELLANEOUS)
BLADE SURG 15 STRL LF DISP TIS (BLADE) ×1 IMPLANT
BLADE SURG 15 STRL SS (BLADE) ×1
CANISTER SUC SOCK COL 7IN (MISCELLANEOUS) ×1 IMPLANT
CANISTER SUCT 1200ML W/VALVE (MISCELLANEOUS) ×1 IMPLANT
CHLORAPREP W/TINT 26 (MISCELLANEOUS) ×1 IMPLANT
CLIP APPLIE 9.375 MED OPEN (MISCELLANEOUS) IMPLANT
COVER BACK TABLE 60X90IN (DRAPES) ×1 IMPLANT
COVER MAYO STAND STRL (DRAPES) ×1 IMPLANT
COVER PROBE CYLINDRICAL 5X96 (MISCELLANEOUS) ×1 IMPLANT
DERMABOND ADVANCED .7 DNX12 (GAUZE/BANDAGES/DRESSINGS) ×1 IMPLANT
DRAPE LAPAROSCOPIC ABDOMINAL (DRAPES) ×1 IMPLANT
DRAPE UTILITY XL STRL (DRAPES) ×1 IMPLANT
ELECT COATED BLADE 2.86 ST (ELECTRODE) ×1 IMPLANT
ELECT REM PT RETURN 9FT ADLT (ELECTROSURGICAL) ×1
ELECTRODE REM PT RTRN 9FT ADLT (ELECTROSURGICAL) ×1 IMPLANT
GLOVE BIO SURGEON STRL SZ 6.5 (GLOVE) IMPLANT
GLOVE BIO SURGEON STRL SZ7.5 (GLOVE) ×2 IMPLANT
GOWN STRL REUS W/ TWL LRG LVL3 (GOWN DISPOSABLE) ×2 IMPLANT
GOWN STRL REUS W/TWL LRG LVL3 (GOWN DISPOSABLE) ×2
ILLUMINATOR WAVEGUIDE N/F (MISCELLANEOUS) IMPLANT
KIT MARKER MARGIN INK (KITS) ×1 IMPLANT
LIGHT WAVEGUIDE WIDE FLAT (MISCELLANEOUS) IMPLANT
NDL HYPO 25X1 1.5 SAFETY (NEEDLE) IMPLANT
NEEDLE HYPO 25X1 1.5 SAFETY (NEEDLE) IMPLANT
NS IRRIG 1000ML POUR BTL (IV SOLUTION) IMPLANT
PACK BASIN DAY SURGERY FS (CUSTOM PROCEDURE TRAY) ×1 IMPLANT
PENCIL SMOKE EVACUATOR (MISCELLANEOUS) ×1 IMPLANT
SLEEVE SCD COMPRESS KNEE MED (STOCKING) ×1 IMPLANT
SPIKE FLUID TRANSFER (MISCELLANEOUS) IMPLANT
SPONGE T-LAP 18X18 ~~LOC~~+RFID (SPONGE) ×1 IMPLANT
SUT MON AB 4-0 PC3 18 (SUTURE) ×1 IMPLANT
SUT SILK 2 0 SH (SUTURE) IMPLANT
SUT VICRYL 3-0 CR8 SH (SUTURE) ×1 IMPLANT
SYR CONTROL 10ML LL (SYRINGE) IMPLANT
TOWEL GREEN STERILE FF (TOWEL DISPOSABLE) ×1 IMPLANT
TRAY FAXITRON CT DISP (TRAY / TRAY PROCEDURE) ×1 IMPLANT
TUBE CONNECTING 20X1/4 (TUBING) ×1 IMPLANT
YANKAUER SUCT BULB TIP NO VENT (SUCTIONS) IMPLANT

## 2022-11-01 NOTE — H&P (Signed)
REFERRING PHYSICIAN: Martinique, Betty G  PROVIDER: Landry Corporal, MD  MRN: [id number] DOB: [birth year] Subjective  Chief Complaint: Breast Cancer   History of Present Illness: Sheila Hartman is a 72 y.o. female who is seen today as an office consultation for evaluation of Breast Cancer .  We are asked to see the patient in consultation by Dr. Betty Martinique to evaluate her for a new left breast cancer. The patient is a 72 year old black female who recently went for a routine screening mammogram. At that time she was found to have a 9 mm mass and 2 cm of calcification in the upper outer quadrant of the left breast. The calcs were biopsied and came back benign. The mass was biopsied and came back as ductal carcinoma in situ that was ER and PR positive. The lymph nodes looked normal. She does have some hypertension but is otherwise in good health and does not smoke. She has a family history of breast cancer and a aunt  Review of Systems: A complete review of systems was obtained from the patient. I have reviewed this information and discussed as appropriate with the patient. See HPI as well for other ROS.  ROS  Medical History: Past Medical History: Diagnosis Date Anemia Arthritis Hypertension  Patient Active Problem List Diagnosis Ductal carcinoma in situ (DCIS) of left breast  Past Surgical History: Procedure Laterality Date HYSTERECTOMY   Allergies Allergen Reactions Penicillins Hives and Rash  Current Outpatient Medications on File Prior to Visit Medication Sig Dispense Refill amLODIPine-benazepril (LOTREL) 5-20 mg capsule Take 1 capsule by mouth once daily  No current facility-administered medications on file prior to visit.  Family History Problem Relation Age of Onset Prostate cancer Mother Obesity Mother High blood pressure (Hypertension) Mother Hyperlipidemia (Elevated cholesterol) Mother Coronary Artery Disease (Blocked arteries around heart)  Mother Hyperlipidemia (Elevated cholesterol) Father High blood pressure (Hypertension) Father Obesity Brother Hyperlipidemia (Elevated cholesterol) Brother   Social History  Tobacco Use Smoking Status Never Smokeless Tobacco Never   Social History  Socioeconomic History Marital status: Single Tobacco Use Smoking status: Never Smokeless tobacco: Never Substance and Sexual Activity Alcohol use: Not Currently Drug use: Defer  Objective:  Vitals: BP: 139/83 Pulse: (!) 114 Weight: (!) 131 kg (288 lb 12.8 oz) Height: 172.7 cm (5' 8"$ ) PainSc: 0-No pain  Body mass index is 43.91 kg/m.  Physical Exam Vitals reviewed. Constitutional: General: She is not in acute distress. Appearance: Normal appearance. HENT: Head: Normocephalic and atraumatic. Right Ear: External ear normal. Left Ear: External ear normal. Nose: Nose normal. Mouth/Throat: Mouth: Mucous membranes are moist. Pharynx: Oropharynx is clear. Eyes: General: No scleral icterus. Extraocular Movements: Extraocular movements intact. Conjunctiva/sclera: Conjunctivae normal. Pupils: Pupils are equal, round, and reactive to light. Cardiovascular: Rate and Rhythm: Normal rate and regular rhythm. Pulses: Normal pulses. Heart sounds: Normal heart sounds. Pulmonary: Effort: Pulmonary effort is normal. No respiratory distress. Breath sounds: Normal breath sounds. Abdominal: General: Bowel sounds are normal. Palpations: Abdomen is soft. Tenderness: There is no abdominal tenderness. Musculoskeletal: General: No swelling, tenderness or deformity. Normal range of motion. Cervical back: Normal range of motion and neck supple. Skin: General: Skin is warm and dry. Coloration: Skin is not jaundiced. Neurological: General: No focal deficit present. Mental Status: She is alert and oriented to person, place, and time. Psychiatric: Mood and Affect: Mood normal. Behavior: Behavior normal.    Breast: There is a  large palpable bruise in the upper outer quadrant of the left breast. There is no other palpable mass  in either breast. There is no palpable axillary, supraclavicular, or cervical lymphadenopathy. She does have a small fatty mass consistent with a lipoma on the right neck  Labs, Imaging and Diagnostic Testing:  Assessment and Plan:  Diagnoses and all orders for this visit:  Ductal carcinoma in situ (DCIS) of left breast - Ambulatory Referral to Oncology-Medical - Ambulatory Referral to Radiation Oncology - [facility] Case Posting Request; Future    The patient appears to have a 9 mm area of ductal carcinoma in situ in the upper outer quadrant of the left breast. I have discussed with her in detail the different options for treatment and at this point she favors breast conservation which I feel is very reasonable. She will not need a node evaluation. I have discussed with her in detail the risks and benefits of the operation as well as some of the technical aspects including the use of a radioactive seed localization and she understands and wishes to proceed. We will also refer her to medical and radiation oncology to discuss adjuvant therapy. We will begin surgical planning.

## 2022-11-01 NOTE — Anesthesia Postprocedure Evaluation (Signed)
Anesthesia Post Note  Patient: Sheila Hartman  Procedure(s) Performed: LEFT BREAST LUMPECTOMY WITH RADIOACTIVE SEED LOCALIZATION (Left: Breast)     Patient location during evaluation: PACU Anesthesia Type: General Level of consciousness: awake and alert, oriented and patient cooperative Pain management: pain level controlled Vital Signs Assessment: post-procedure vital signs reviewed and stable Respiratory status: spontaneous breathing, nonlabored ventilation and respiratory function stable Cardiovascular status: blood pressure returned to baseline and stable Postop Assessment: no apparent nausea or vomiting Anesthetic complications: no   No notable events documented.  Last Vitals:  Vitals:   11/01/22 0929 11/01/22 1100  BP: 134/83 130/85  Pulse: 91 96  Resp: 16 18  Temp: 36.9 C 36.7 C  SpO2: 96% 100%    Last Pain:  Vitals:   11/01/22 1100  TempSrc:   PainSc: 0-No pain                 Pervis Hocking

## 2022-11-01 NOTE — Anesthesia Preprocedure Evaluation (Addendum)
Anesthesia Evaluation  Patient identified by MRN, date of birth, ID band Patient awake    Reviewed: Allergy & Precautions, NPO status , Patient's Chart, lab work & pertinent test results  History of Anesthesia Complications (+) PONV and history of anesthetic complications (only one instance of PONV remote hx)  Airway Mallampati: I  TM Distance: >3 FB Neck ROM: Full    Dental  (+) Missing, Dental Advisory Given,    Pulmonary asthma , former smoker Quit smoking 1997, 50 pack year history    Pulmonary exam normal breath sounds clear to auscultation       Cardiovascular hypertension (134/68 preop), Pt. on medications Normal cardiovascular exam Rhythm:Regular Rate:Normal     Neuro/Psych  Headaches PSYCHIATRIC DISORDERS Anxiety     TIA   GI/Hepatic Neg liver ROS,GERD  Medicated and Controlled,,  Endo/Other    Morbid obesityBMI 45  Renal/GU negative Renal ROS  negative genitourinary   Musculoskeletal  (+) Arthritis , Osteoarthritis,    Abdominal  (+) + obese  Peds  Hematology negative hematology ROS (+)   Anesthesia Other Findings L breast DCIS   Reproductive/Obstetrics negative OB ROS                             Anesthesia Physical Anesthesia Plan  ASA: 3  Anesthesia Plan: General   Post-op Pain Management: Tylenol PO (pre-op)*   Induction: Intravenous  PONV Risk Score and Plan: 4 or greater and Ondansetron, Midazolam and Treatment may vary due to age or medical condition  Airway Management Planned: LMA  Additional Equipment: None  Intra-op Plan:   Post-operative Plan: Extubation in OR  Informed Consent: I have reviewed the patients History and Physical, chart, labs and discussed the procedure including the risks, benefits and alternatives for the proposed anesthesia with the patient or authorized representative who has indicated his/her understanding and acceptance.      Dental advisory given  Plan Discussed with: CRNA  Anesthesia Plan Comments:        Anesthesia Quick Evaluation

## 2022-11-01 NOTE — Discharge Instructions (Signed)
  Post Anesthesia Home Care Instructions  Activity: Get plenty of rest for the remainder of the day. A responsible individual must stay with you for 24 hours following the procedure.  For the next 24 hours, DO NOT: -Drive a car -Paediatric nurse -Drink alcoholic beverages -Take any medication unless instructed by your physician -Make any legal decisions or sign important papers.  Meals: Start with liquid foods such as gelatin or soup. Progress to regular foods as tolerated. Avoid greasy, spicy, heavy foods. If nausea and/or vomiting occur, drink only clear liquids until the nausea and/or vomiting subsides. Call your physician if vomiting continues.  Special Instructions/Symptoms: Your throat may feel dry or sore from the anesthesia or the breathing tube placed in your throat during surgery. If this causes discomfort, gargle with warm salt water. The discomfort should disappear within 24 hours.  If you had a scopolamine patch placed behind your ear for the management of post- operative nausea and/or vomiting:  1. The medication in the patch is effective for 72 hours, after which it should be removed.  Wrap patch in a tissue and discard in the trash. Wash hands thoroughly with soap and water. 2. You may remove the patch earlier than 72 hours if you experience unpleasant side effects which may include dry mouth, dizziness or visual disturbances. 3. Avoid touching the patch. Wash your hands with soap and water after contact with the patch.      Next dose of tylenol and ibuprofen may be taken at 4p

## 2022-11-01 NOTE — Interval H&P Note (Signed)
History and Physical Interval Note:  11/01/2022 9:46 AM  Sheila Hartman  has presented today for surgery, with the diagnosis of LEFT BREAST DCIS.  The various methods of treatment have been discussed with the patient and family. After consideration of risks, benefits and other options for treatment, the patient has consented to  Procedure(s): LEFT BREAST LUMPECTOMY WITH RADIOACTIVE SEED LOCALIZATION (Left) as a surgical intervention.  The patient's history has been reviewed, patient examined, no change in status, stable for surgery.  I have reviewed the patient's chart and labs.  Questions were answered to the patient's satisfaction.     Autumn Messing III

## 2022-11-01 NOTE — Op Note (Signed)
11/01/2022  10:56 AM  PATIENT:  Sheila Hartman  72 y.o. female  PRE-OPERATIVE DIAGNOSIS:  LEFT BREAST DCIS  POST-OPERATIVE DIAGNOSIS:  LEFT BREAST DCIS  PROCEDURE:  Procedure(s): LEFT BREAST LUMPECTOMY WITH RADIOACTIVE SEED LOCALIZATION (Left)  SURGEON:  Surgeon(s) and Role:    Jovita Kussmaul, MD - Primary  PHYSICIAN ASSISTANT:   ASSISTANTS: none   ANESTHESIA:   local and general  EBL:  25 mL   BLOOD ADMINISTERED:none  DRAINS: none   LOCAL MEDICATIONS USED:  MARCAINE     SPECIMEN:  Source of Specimen:  left breast tissue  DISPOSITION OF SPECIMEN:  PATHOLOGY  COUNTS:  YES  TOURNIQUET:  * No tourniquets in log *  DICTATION: .Dragon Dictation  After informed consent was obtained the patient was brought to the operating room and placed in the supine position on the operating table.  After adequate induction of general anesthesia the patient's left breast was prepped with ChloraPrep, allowed to dry, and draped in usual sterile manner.  An appropriate timeout was performed.  Previously an I-125 seed was placed in the outer aspect of the left breast to mark an area of ductal carcinoma in situ.  The neoprobe was set to I-125 and the area of radioactivity was readily identified.  The area around this was infiltrated with quarter percent Marcaine.  An elliptical incision that was vertically oriented was made in the outer aspect of the left breast overlying the area of radioactivity with a 15 blade knife.  The incision was carried through the skin and subcutaneous tissue sharply with the electrocautery.  Dissection was then carried widely around the radioactive seed while checking the area of radioactivity frequently.  Once the specimen was removed from the patient it was oriented with the appropriate paint colors.  A specimen radiograph was obtained that showed the clip and seed to be near the center of the specimen.  The specimen was then sent to pathology for further evaluation.   Hemostasis was achieved using the Bovie electrocautery.  The wound was irrigated with saline and infiltrated with more quarter percent Marcaine.  The cavity was marked with clips.  The deep layer of the wound was then closed with layers of interrupted 3-0 Vicryl stitches.  The skin was then closed with a running 4-0 Monocryl subcuticular stitch.  Dermabond dressings were applied.  The patient tolerated the procedure well.  At the end of the case all needle sponge and instrument counts were correct.  The patient was then awakened and taken to recovery in stable condition.  PLAN OF CARE: Discharge to home after PACU  PATIENT DISPOSITION:  PACU - hemodynamically stable.   Delay start of Pharmacological VTE agent (>24hrs) due to surgical blood loss or risk of bleeding: not applicable

## 2022-11-01 NOTE — Transfer of Care (Signed)
Immediate Anesthesia Transfer of Care Note  Patient: Sheila Hartman  Procedure(s) Performed: LEFT BREAST LUMPECTOMY WITH RADIOACTIVE SEED LOCALIZATION (Left: Breast)  Patient Location: PACU  Anesthesia Type:General  Level of Consciousness: awake, alert , and oriented  Airway & Oxygen Therapy: Patient Spontanous Breathing and Patient connected to face mask oxygen  Post-op Assessment: Report given to RN and Post -op Vital signs reviewed and stable  Post vital signs: Reviewed and stable  Last Vitals:  Vitals Value Taken Time  BP 130/85 11/01/22 1100  Temp    Pulse 96 11/01/22 1102  Resp 19 11/01/22 1102  SpO2 100 % 11/01/22 1102  Vitals shown include unvalidated device data.  Last Pain:  Vitals:   11/01/22 0929  TempSrc: Oral  PainSc: 0-No pain      Patients Stated Pain Goal: 5 ([id number])  Complications: No notable events documented.

## 2022-11-01 NOTE — Anesthesia Procedure Notes (Signed)
Procedure Name: LMA Insertion Date/Time: 11/01/2022 10:11 AM  Performed by: Bufford Spikes, CRNAPre-anesthesia Checklist: Patient identified, Emergency Drugs available, Suction available and Patient being monitored Patient Re-evaluated:Patient Re-evaluated prior to induction Oxygen Delivery Method: Circle system utilized Preoxygenation: Pre-oxygenation with 100% oxygen Induction Type: IV induction Ventilation: Mask ventilation without difficulty LMA: LMA inserted LMA Size: 4.0 Number of attempts: 1 Placement Confirmation: positive ETCO2 Tube secured with: Tape Dental Injury: Teeth and Oropharynx as per pre-operative assessment

## 2022-11-02 ENCOUNTER — Inpatient Hospital Stay: Payer: Medicare Other | Attending: Hematology and Oncology | Admitting: Licensed Clinical Social Worker

## 2022-11-02 ENCOUNTER — Encounter (HOSPITAL_BASED_OUTPATIENT_CLINIC_OR_DEPARTMENT_OTHER): Payer: Self-pay | Admitting: General Surgery

## 2022-11-02 NOTE — Progress Notes (Signed)
[facility] CSW Progress Note  Clinical Education officer, museum received TC from pt inquiring about gas vouchers for when she is traveling to [facility] daily for radiation. CSW will refer pt to radonc team for [employer] for potential assistance if pt qualifies. Pt may also benefit from [place] during her treatment.  No other needs at this time.    Henok Heacock E Brigida Scotti, LCSW

## 2022-11-04 ENCOUNTER — Encounter: Payer: Self-pay | Admitting: Gastroenterology

## 2022-11-10 ENCOUNTER — Encounter: Payer: Self-pay | Admitting: *Deleted

## 2022-11-10 ENCOUNTER — Telehealth: Payer: Self-pay | Admitting: Gastroenterology

## 2022-11-10 NOTE — Telephone Encounter (Signed)
Dr Silverio Decamp Is this patient still suppose to be using the Vit D? It looks like it was for 8 weeks in the beginning

## 2022-11-10 NOTE — Telephone Encounter (Signed)
Inbound call from pt , she want to get more clarity on what vitamin D she needs to take.Marland KitchenPlease advise

## 2022-11-11 ENCOUNTER — Encounter (HOSPITAL_COMMUNITY): Payer: Self-pay

## 2022-11-15 NOTE — Telephone Encounter (Signed)
Patient call to follow up on this Medication..Please advise

## 2022-11-16 NOTE — Telephone Encounter (Signed)
Dr Silverio Decamp do you want this pt to continue Vitamin D

## 2022-11-21 ENCOUNTER — Other Ambulatory Visit: Payer: Self-pay | Admitting: Family Medicine

## 2022-11-21 DIAGNOSIS — I10 Essential (primary) hypertension: Secondary | ICD-10-CM

## 2022-11-23 NOTE — Progress Notes (Signed)
Follow-up-new nursing interview for Ductal carcinoma in situ (DCIS) of left breast.  Patient identity verified. Patient reports doing well. No related issues conveyed at this time.  Meaningful use complete. Hysterectomy  BP (!) 155/92 (BP Location: Left Arm, Patient Position: Sitting, Cuff Size: Large)   Pulse (!) 102   Temp 97.6 F (36.4 C)   Resp 20   Ht '5\' 8"'$  (1.727 m)   Wt 298 lb (135.2 kg)   SpO2 100%   BMI 45.31 kg/m    This concludes the interview.   Leandra Kern, LPN

## 2022-11-29 LAB — SURGICAL PATHOLOGY
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
## 2022-11-30 ENCOUNTER — Ambulatory Visit
Admission: RE | Admit: 2022-11-30 | Discharge: 2022-11-30 | Disposition: A | Discharge status: Home / Self Care | Payer: Medicare Other | Source: Ambulatory Visit | Attending: Radiation Oncology | Admitting: Radiation Oncology

## 2022-11-30 ENCOUNTER — Other Ambulatory Visit: Payer: Self-pay

## 2022-11-30 ENCOUNTER — Encounter: Payer: Self-pay | Admitting: Radiation Oncology

## 2022-11-30 VITALS — BP 155/92 | HR 102 | Temp 97.6°F | Resp 20 | Ht 68.0 in | Wt 298.0 lb

## 2022-11-30 DIAGNOSIS — Z170 Estrogen receptor positive status [ER+]: Secondary | ICD-10-CM | POA: Diagnosis not present

## 2022-11-30 DIAGNOSIS — Z510 Encounter for antineoplastic radiation therapy: Secondary | ICD-10-CM | POA: Insufficient documentation

## 2022-11-30 DIAGNOSIS — D0512 Intraductal carcinoma in situ of left breast: Secondary | ICD-10-CM | POA: Insufficient documentation

## 2022-12-02 ENCOUNTER — Telehealth: Payer: Self-pay | Admitting: Radiation Oncology

## 2022-12-02 NOTE — Telephone Encounter (Signed)
Called patient and went over information needed to apply for [employer].  Patient verbalized understanding and stated she would bring in info on 03/26

## 2022-12-03 NOTE — Telephone Encounter (Signed)
She should take over-the-counter vitamin D 2000 national units daily.  Thank you

## 2022-12-06 ENCOUNTER — Encounter: Payer: Self-pay | Admitting: *Deleted

## 2022-12-06 ENCOUNTER — Inpatient Hospital Stay: Payer: Medicare Other | Attending: Hematology and Oncology | Admitting: Hematology and Oncology

## 2022-12-06 ENCOUNTER — Other Ambulatory Visit: Payer: Self-pay

## 2022-12-06 VITALS — BP 137/77 | HR 99 | Temp 97.8°F | Resp 16 | Ht 68.0 in | Wt 292.6 lb

## 2022-12-06 DIAGNOSIS — D0512 Intraductal carcinoma in situ of left breast: Secondary | ICD-10-CM | POA: Insufficient documentation

## 2022-12-06 DIAGNOSIS — I10 Essential (primary) hypertension: Secondary | ICD-10-CM | POA: Insufficient documentation

## 2022-12-06 DIAGNOSIS — Z87891 Personal history of nicotine dependence: Secondary | ICD-10-CM | POA: Insufficient documentation

## 2022-12-06 DIAGNOSIS — Z90710 Acquired absence of both cervix and uterus: Secondary | ICD-10-CM | POA: Diagnosis not present

## 2022-12-06 DIAGNOSIS — Z803 Family history of malignant neoplasm of breast: Secondary | ICD-10-CM | POA: Insufficient documentation

## 2022-12-06 DIAGNOSIS — Z801 Family history of malignant neoplasm of trachea, bronchus and lung: Secondary | ICD-10-CM | POA: Diagnosis not present

## 2022-12-06 NOTE — Telephone Encounter (Signed)
Returned patients call, Told her to take Vitamin D 2000 units   She wanted to know if it was a D3.Marland Kitchen When I reserched the Vitamin for her it seems the 2000 units was the D3 . She will start the Vitamin supplement today

## 2022-12-06 NOTE — Assessment & Plan Note (Signed)
This is a very pleasant 72 year old postmenopausal female patient with newly diagnosed left breast DCIS, ER/PR positive referred to medical oncology for recommendations.  She underwent left breast lumpectomy, final pathology showed grade 2 DCIS and an intraductal papilloma, negative margins.  She is now scheduled for adjuvant radiation, anticipate completion of radiation on April 22.  We once again reviewed mechanism of action of anastrozole, adverse effects of anastrozole including but not limited to postmenopausal symptoms, arthralgias, bone density loss etc.  Repeat bone density ordered, last bone density in 2021 was normal.  She will return to clinic in second week of May to initiate antiestrogen therapy.  She is agreeable to this plan.  We have briefly discussed about tamoxifen however given her sedentary lifestyle, she may be at higher risk of DVT/PE.
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
## 2022-12-09 ENCOUNTER — Other Ambulatory Visit: Payer: Self-pay | Admitting: Family Medicine

## 2022-12-12 DIAGNOSIS — D0512 Intraductal carcinoma in situ of left breast: Secondary | ICD-10-CM | POA: Diagnosis not present

## 2022-12-12 DIAGNOSIS — Z170 Estrogen receptor positive status [ER+]: Secondary | ICD-10-CM | POA: Diagnosis not present

## 2022-12-12 DIAGNOSIS — Z510 Encounter for antineoplastic radiation therapy: Secondary | ICD-10-CM | POA: Diagnosis not present

## 2022-12-13 ENCOUNTER — Ambulatory Visit: Payer: Medicare Other | Admitting: Podiatrist

## 2022-12-13 ENCOUNTER — Encounter: Payer: Self-pay | Admitting: Podiatrist

## 2022-12-13 DIAGNOSIS — M79674 Pain in right toe(s): Secondary | ICD-10-CM

## 2022-12-13 DIAGNOSIS — M79675 Pain in left toe(s): Secondary | ICD-10-CM

## 2022-12-13 DIAGNOSIS — B351 Tinea unguium: Secondary | ICD-10-CM | POA: Diagnosis not present

## 2022-12-13 NOTE — Progress Notes (Signed)
  Subjective:  Patient ID: Sheila Hartman, female    DOB: 09-07-1951,  MRN: [id number]  Sheila Hartman presents to clinic today for painful elongated mycotic toenails 1-5 bilaterally which are tender when wearing enclosed shoe gear. Pain is relieved with periodic professional debridement.  Chief Complaint  Patient presents with   Nail Problem    [facility] PCP-[facility] PCP VST-03/2022   New problem(s): None.   PCP is Martinique, Betty G, MD.  Allergies  Allergen Reactions   Penicillins Hives   Review of Systems: Negative except as noted in the HPI.  Objective: No changes noted in today's physical examination. There were no vitals filed for this visit.  Sheila Hartman is a pleasant 72 y.o. female morbidly obese in NAD. AAO x 3.  Neurovascular Examination: CFT immediate b/l LE. Palpable DP/PT pulses b/l LE. Digital hair sparse b/l. Skin temperature gradient WNL b/l. No pain with calf compression b/l. No edema noted b/l. No cyanosis or clubbing noted b/l LE.  Protective sensation intact 5/5 intact bilaterally with 10g monofilament b/l. Vibratory sensation intact b/l.  Dermatological:  Pedal integument with normal turgor, texture and tone b/l LE. No open wounds b/l. No interdigital macerations b/l.   Toenails 1-5 b/l elongated, thickened, discolored with subungual debris. +Tenderness with dorsal palpation of nailplates. No hyperkeratotic or porokeratotic lesions present.  Musculoskeletal:  Normal muscle strength 5/5 to all lower extremity muscle groups bilaterally. HAV with bunion deformity noted b/l LE. Hammertoe(s) noted to the R 2nd toe. No pain, crepitus or joint limitation noted with ROM b/l LE.  Patient ambulates independently without assistive aids.  Assessment/Plan:   ICD-10-CM   1. Pain due to onychomycosis of toenails of both feet  B35.1    M79.675    M79.674       -Consent given for treatment as described below: -Examined patient. -Continue supportive shoe gear  daily. -Toenails 1-5 b/l were debrided in length and girth with sterile nail nippers and dremel without iatrogenic bleeding.  -Patient/POA to call should there be question/concern in the interim.     Bronson Ing, DPM

## 2022-12-14 ENCOUNTER — Ambulatory Visit
Admission: RE | Admit: 2022-12-14 | Discharge: 2022-12-14 | Disposition: A | Discharge status: Home / Self Care | Payer: Medicare Other | Source: Ambulatory Visit | Attending: Radiation Oncology | Admitting: Radiation Oncology

## 2022-12-14 ENCOUNTER — Other Ambulatory Visit: Payer: Self-pay

## 2022-12-14 DIAGNOSIS — D0512 Intraductal carcinoma in situ of left breast: Secondary | ICD-10-CM | POA: Diagnosis not present

## 2022-12-14 DIAGNOSIS — Z170 Estrogen receptor positive status [ER+]: Secondary | ICD-10-CM | POA: Diagnosis not present

## 2022-12-14 DIAGNOSIS — Z510 Encounter for antineoplastic radiation therapy: Secondary | ICD-10-CM | POA: Diagnosis not present

## 2022-12-14 LAB — RAD ONC ARIA SESSION SUMMARY
Course Elapsed Days: 0
Plan Fractions Treated to Date: 1
Plan Prescribed Dose Per Fraction: 2.66 Gy
Plan Total Fractions Prescribed: 16
Plan Total Prescribed Dose: 42.56 Gy
Reference Point Dosage Given to Date: 2.66 Gy
Reference Point Session Dosage Given: 2.66 Gy
Session Number: 1

## 2022-12-15 ENCOUNTER — Ambulatory Visit
Admission: RE | Admit: 2022-12-15 | Discharge: 2022-12-15 | Disposition: A | Discharge status: Home / Self Care | Payer: Medicare Other | Source: Ambulatory Visit | Attending: Radiation Oncology | Admitting: Radiation Oncology

## 2022-12-15 ENCOUNTER — Other Ambulatory Visit: Payer: Self-pay

## 2022-12-15 DIAGNOSIS — Z170 Estrogen receptor positive status [ER+]: Secondary | ICD-10-CM | POA: Diagnosis not present

## 2022-12-15 DIAGNOSIS — D0512 Intraductal carcinoma in situ of left breast: Secondary | ICD-10-CM | POA: Diagnosis not present

## 2022-12-15 DIAGNOSIS — Z510 Encounter for antineoplastic radiation therapy: Secondary | ICD-10-CM | POA: Diagnosis not present

## 2022-12-15 LAB — RAD ONC ARIA SESSION SUMMARY
Course Elapsed Days: 1
Plan Fractions Treated to Date: 1
Plan Prescribed Dose Per Fraction: 2.66 Gy
Plan Total Fractions Prescribed: 15
Plan Total Prescribed Dose: 39.9 Gy
Reference Point Dosage Given to Date: 5.32 Gy
Reference Point Session Dosage Given: 2.66 Gy
Session Number: 2

## 2022-12-16 ENCOUNTER — Other Ambulatory Visit: Payer: Self-pay

## 2022-12-16 ENCOUNTER — Ambulatory Visit
Admission: RE | Admit: 2022-12-16 | Discharge: 2022-12-16 | Disposition: A | Discharge status: Home / Self Care | Payer: Medicare Other | Source: Ambulatory Visit | Attending: Radiation Oncology | Admitting: Radiation Oncology

## 2022-12-16 DIAGNOSIS — Z510 Encounter for antineoplastic radiation therapy: Secondary | ICD-10-CM | POA: Diagnosis not present

## 2022-12-16 DIAGNOSIS — Z170 Estrogen receptor positive status [ER+]: Secondary | ICD-10-CM | POA: Diagnosis not present

## 2022-12-16 DIAGNOSIS — D0512 Intraductal carcinoma in situ of left breast: Secondary | ICD-10-CM | POA: Diagnosis not present

## 2022-12-16 LAB — RAD ONC ARIA SESSION SUMMARY
Course Elapsed Days: 2
Plan Fractions Treated to Date: 2
Plan Prescribed Dose Per Fraction: 2.66 Gy
Plan Total Fractions Prescribed: 15
Plan Total Prescribed Dose: 39.9 Gy
Reference Point Dosage Given to Date: 7.98 Gy
Reference Point Session Dosage Given: 2.66 Gy
Session Number: 3

## 2022-12-17 ENCOUNTER — Ambulatory Visit
Admission: RE | Admit: 2022-12-17 | Discharge: 2022-12-17 | Disposition: A | Discharge status: Home / Self Care | Payer: Medicare Other | Source: Ambulatory Visit | Attending: Radiation Oncology | Admitting: Radiation Oncology

## 2022-12-17 ENCOUNTER — Other Ambulatory Visit: Payer: Self-pay

## 2022-12-17 DIAGNOSIS — D0512 Intraductal carcinoma in situ of left breast: Secondary | ICD-10-CM | POA: Diagnosis not present

## 2022-12-17 DIAGNOSIS — Z510 Encounter for antineoplastic radiation therapy: Secondary | ICD-10-CM | POA: Diagnosis not present

## 2022-12-17 DIAGNOSIS — Z170 Estrogen receptor positive status [ER+]: Secondary | ICD-10-CM | POA: Diagnosis not present

## 2022-12-17 LAB — RAD ONC ARIA SESSION SUMMARY
Course Elapsed Days: 3
Plan Fractions Treated to Date: 3
Plan Prescribed Dose Per Fraction: 2.66 Gy
Plan Total Fractions Prescribed: 15
Plan Total Prescribed Dose: 39.9 Gy
Reference Point Dosage Given to Date: 10.64 Gy
Reference Point Session Dosage Given: 2.66 Gy
Session Number: 4

## 2022-12-17 MED ORDER — RADIAPLEXRX EX GEL: Freq: Once | CUTANEOUS | Status: AC

## 2022-12-17 MED ORDER — ALRA NON-METALLIC DEODORANT (RAD-ONC)
1.0000 | Freq: Once | TOPICAL | Status: AC
  Administered 2022-12-17: 1 via TOPICAL

## 2022-12-20 ENCOUNTER — Other Ambulatory Visit: Payer: Self-pay

## 2022-12-20 ENCOUNTER — Ambulatory Visit
Admission: RE | Admit: 2022-12-20 | Discharge: 2022-12-20 | Disposition: A | Discharge status: Home / Self Care | Payer: Medicare Other | Source: Ambulatory Visit | Attending: Radiation Oncology | Admitting: Radiation Oncology

## 2022-12-20 DIAGNOSIS — Z170 Estrogen receptor positive status [ER+]: Secondary | ICD-10-CM | POA: Insufficient documentation

## 2022-12-20 DIAGNOSIS — Z510 Encounter for antineoplastic radiation therapy: Secondary | ICD-10-CM | POA: Insufficient documentation

## 2022-12-20 DIAGNOSIS — D0512 Intraductal carcinoma in situ of left breast: Secondary | ICD-10-CM | POA: Diagnosis not present

## 2022-12-20 LAB — RAD ONC ARIA SESSION SUMMARY
Course Elapsed Days: 6
Plan Fractions Treated to Date: 4
Plan Prescribed Dose Per Fraction: 2.66 Gy
Plan Total Fractions Prescribed: 15
Plan Total Prescribed Dose: 39.9 Gy
Reference Point Dosage Given to Date: 13.3 Gy
Reference Point Session Dosage Given: 2.66 Gy
Session Number: 5

## 2022-12-21 ENCOUNTER — Ambulatory Visit
Admission: RE | Admit: 2022-12-21 | Discharge: 2022-12-21 | Disposition: A | Discharge status: Home / Self Care | Payer: Medicare Other | Source: Ambulatory Visit | Attending: Radiation Oncology | Admitting: Radiation Oncology

## 2022-12-21 ENCOUNTER — Other Ambulatory Visit: Payer: Self-pay

## 2022-12-21 DIAGNOSIS — D0512 Intraductal carcinoma in situ of left breast: Secondary | ICD-10-CM | POA: Diagnosis not present

## 2022-12-21 DIAGNOSIS — Z170 Estrogen receptor positive status [ER+]: Secondary | ICD-10-CM | POA: Diagnosis not present

## 2022-12-21 DIAGNOSIS — Z510 Encounter for antineoplastic radiation therapy: Secondary | ICD-10-CM | POA: Diagnosis not present

## 2022-12-21 LAB — RAD ONC ARIA SESSION SUMMARY
Course Elapsed Days: 7
Plan Fractions Treated to Date: 5
Plan Prescribed Dose Per Fraction: 2.66 Gy
Plan Total Fractions Prescribed: 15
Plan Total Prescribed Dose: 39.9 Gy
Reference Point Dosage Given to Date: 15.96 Gy
Reference Point Session Dosage Given: 2.66 Gy
Session Number: 6

## 2022-12-22 ENCOUNTER — Ambulatory Visit
Admission: RE | Admit: 2022-12-22 | Discharge: 2022-12-22 | Disposition: A | Discharge status: Home / Self Care | Payer: Medicare Other | Source: Ambulatory Visit | Attending: Radiation Oncology | Admitting: Radiation Oncology

## 2022-12-22 ENCOUNTER — Other Ambulatory Visit: Payer: Self-pay

## 2022-12-22 DIAGNOSIS — Z170 Estrogen receptor positive status [ER+]: Secondary | ICD-10-CM | POA: Diagnosis not present

## 2022-12-22 DIAGNOSIS — Z510 Encounter for antineoplastic radiation therapy: Secondary | ICD-10-CM | POA: Diagnosis not present

## 2022-12-22 DIAGNOSIS — D0512 Intraductal carcinoma in situ of left breast: Secondary | ICD-10-CM | POA: Diagnosis not present

## 2022-12-22 LAB — RAD ONC ARIA SESSION SUMMARY
Course Elapsed Days: 8
Plan Fractions Treated to Date: 6
Plan Prescribed Dose Per Fraction: 2.66 Gy
Plan Total Fractions Prescribed: 15
Plan Total Prescribed Dose: 39.9 Gy
Reference Point Dosage Given to Date: 18.62 Gy
Reference Point Session Dosage Given: 2.66 Gy
Session Number: 7

## 2022-12-23 ENCOUNTER — Other Ambulatory Visit: Payer: Self-pay

## 2022-12-23 ENCOUNTER — Ambulatory Visit
Admission: RE | Admit: 2022-12-23 | Discharge: 2022-12-23 | Disposition: A | Discharge status: Home / Self Care | Payer: Medicare Other | Source: Ambulatory Visit | Attending: Radiation Oncology | Admitting: Radiation Oncology

## 2022-12-23 DIAGNOSIS — Z170 Estrogen receptor positive status [ER+]: Secondary | ICD-10-CM | POA: Diagnosis not present

## 2022-12-23 DIAGNOSIS — Z510 Encounter for antineoplastic radiation therapy: Secondary | ICD-10-CM | POA: Diagnosis not present

## 2022-12-23 DIAGNOSIS — D0512 Intraductal carcinoma in situ of left breast: Secondary | ICD-10-CM | POA: Diagnosis not present

## 2022-12-23 LAB — RAD ONC ARIA SESSION SUMMARY
Course Elapsed Days: 9
Plan Fractions Treated to Date: 7
Plan Prescribed Dose Per Fraction: 2.66 Gy
Plan Total Fractions Prescribed: 15
Plan Total Prescribed Dose: 39.9 Gy
Reference Point Dosage Given to Date: 21.28 Gy
Reference Point Session Dosage Given: 2.66 Gy
Session Number: 8

## 2022-12-24 ENCOUNTER — Ambulatory Visit
Admission: RE | Admit: 2022-12-24 | Discharge: 2022-12-24 | Disposition: A | Discharge status: Home / Self Care | Payer: Medicare Other | Source: Ambulatory Visit | Attending: Radiation Oncology | Admitting: Radiation Oncology

## 2022-12-24 ENCOUNTER — Other Ambulatory Visit: Payer: Self-pay

## 2022-12-24 DIAGNOSIS — Z510 Encounter for antineoplastic radiation therapy: Secondary | ICD-10-CM | POA: Diagnosis not present

## 2022-12-24 DIAGNOSIS — D0512 Intraductal carcinoma in situ of left breast: Secondary | ICD-10-CM | POA: Diagnosis not present

## 2022-12-24 DIAGNOSIS — Z170 Estrogen receptor positive status [ER+]: Secondary | ICD-10-CM | POA: Diagnosis not present

## 2022-12-24 LAB — RAD ONC ARIA SESSION SUMMARY
Course Elapsed Days: 10
Plan Fractions Treated to Date: 8
Plan Prescribed Dose Per Fraction: 2.66 Gy
Plan Total Fractions Prescribed: 15
Plan Total Prescribed Dose: 39.9 Gy
Reference Point Dosage Given to Date: 23.94 Gy
Reference Point Session Dosage Given: 2.66 Gy
Session Number: 9

## 2022-12-27 ENCOUNTER — Other Ambulatory Visit: Payer: Self-pay

## 2022-12-27 ENCOUNTER — Ambulatory Visit
Admission: RE | Admit: 2022-12-27 | Discharge: 2022-12-27 | Disposition: A | Discharge status: Home / Self Care | Payer: Medicare Other | Source: Ambulatory Visit | Attending: Radiation Oncology | Admitting: Radiation Oncology

## 2022-12-27 DIAGNOSIS — Z510 Encounter for antineoplastic radiation therapy: Secondary | ICD-10-CM | POA: Diagnosis not present

## 2022-12-27 DIAGNOSIS — D0512 Intraductal carcinoma in situ of left breast: Secondary | ICD-10-CM | POA: Diagnosis not present

## 2022-12-27 DIAGNOSIS — Z170 Estrogen receptor positive status [ER+]: Secondary | ICD-10-CM | POA: Diagnosis not present

## 2022-12-27 LAB — RAD ONC ARIA SESSION SUMMARY
Course Elapsed Days: 13
Plan Fractions Treated to Date: 9
Plan Prescribed Dose Per Fraction: 2.66 Gy
Plan Total Fractions Prescribed: 15
Plan Total Prescribed Dose: 39.9 Gy
Reference Point Dosage Given to Date: 26.6 Gy
Reference Point Session Dosage Given: 2.66 Gy
Session Number: 10

## 2022-12-28 ENCOUNTER — Ambulatory Visit
Admission: RE | Admit: 2022-12-28 | Discharge: 2022-12-28 | Disposition: A | Discharge status: Home / Self Care | Payer: Medicare Other | Source: Ambulatory Visit | Attending: Radiation Oncology | Admitting: Radiation Oncology

## 2022-12-28 ENCOUNTER — Other Ambulatory Visit: Payer: Self-pay

## 2022-12-28 DIAGNOSIS — D0512 Intraductal carcinoma in situ of left breast: Secondary | ICD-10-CM | POA: Diagnosis not present

## 2022-12-28 DIAGNOSIS — Z170 Estrogen receptor positive status [ER+]: Secondary | ICD-10-CM | POA: Diagnosis not present

## 2022-12-28 DIAGNOSIS — Z510 Encounter for antineoplastic radiation therapy: Secondary | ICD-10-CM | POA: Diagnosis not present

## 2022-12-28 LAB — RAD ONC ARIA SESSION SUMMARY
Course Elapsed Days: 14
Plan Fractions Treated to Date: 10
Plan Prescribed Dose Per Fraction: 2.66 Gy
Plan Total Fractions Prescribed: 15
Plan Total Prescribed Dose: 39.9 Gy
Reference Point Dosage Given to Date: 29.26 Gy
Reference Point Session Dosage Given: 2.66 Gy
Session Number: 11

## 2022-12-29 ENCOUNTER — Ambulatory Visit
Admission: RE | Admit: 2022-12-29 | Discharge: 2022-12-29 | Disposition: A | Discharge status: Home / Self Care | Payer: Medicare Other | Source: Ambulatory Visit | Attending: Radiation Oncology | Admitting: Radiation Oncology

## 2022-12-29 ENCOUNTER — Other Ambulatory Visit: Payer: Self-pay

## 2022-12-29 DIAGNOSIS — D0512 Intraductal carcinoma in situ of left breast: Secondary | ICD-10-CM | POA: Diagnosis not present

## 2022-12-29 DIAGNOSIS — Z170 Estrogen receptor positive status [ER+]: Secondary | ICD-10-CM | POA: Diagnosis not present

## 2022-12-29 DIAGNOSIS — Z510 Encounter for antineoplastic radiation therapy: Secondary | ICD-10-CM | POA: Diagnosis not present

## 2022-12-29 LAB — RAD ONC ARIA SESSION SUMMARY
Course Elapsed Days: 15
Plan Fractions Treated to Date: 11
Plan Prescribed Dose Per Fraction: 2.66 Gy
Plan Total Fractions Prescribed: 15
Plan Total Prescribed Dose: 39.9 Gy
Reference Point Dosage Given to Date: 31.92 Gy
Reference Point Session Dosage Given: 2.66 Gy
Session Number: 12

## 2022-12-30 ENCOUNTER — Ambulatory Visit
Admission: RE | Admit: 2022-12-30 | Discharge: 2022-12-30 | Disposition: A | Discharge status: Home / Self Care | Payer: Medicare Other | Source: Ambulatory Visit | Attending: Radiation Oncology | Admitting: Radiation Oncology

## 2022-12-30 ENCOUNTER — Other Ambulatory Visit: Payer: Self-pay

## 2022-12-30 DIAGNOSIS — Z510 Encounter for antineoplastic radiation therapy: Secondary | ICD-10-CM | POA: Diagnosis not present

## 2022-12-30 DIAGNOSIS — Z170 Estrogen receptor positive status [ER+]: Secondary | ICD-10-CM | POA: Diagnosis not present

## 2022-12-30 DIAGNOSIS — D0512 Intraductal carcinoma in situ of left breast: Secondary | ICD-10-CM | POA: Diagnosis not present

## 2022-12-30 LAB — RAD ONC ARIA SESSION SUMMARY
Course Elapsed Days: 16
Plan Fractions Treated to Date: 12
Plan Prescribed Dose Per Fraction: 2.66 Gy
Plan Total Fractions Prescribed: 15
Plan Total Prescribed Dose: 39.9 Gy
Reference Point Dosage Given to Date: 34.58 Gy
Reference Point Session Dosage Given: 2.66 Gy
Session Number: 13

## 2022-12-31 ENCOUNTER — Ambulatory Visit
Admission: RE | Admit: 2022-12-31 | Discharge: 2022-12-31 | Disposition: A | Discharge status: Home / Self Care | Payer: Medicare Other | Source: Ambulatory Visit | Attending: Radiation Oncology | Admitting: Radiation Oncology

## 2022-12-31 ENCOUNTER — Other Ambulatory Visit: Payer: Self-pay

## 2022-12-31 DIAGNOSIS — Z510 Encounter for antineoplastic radiation therapy: Secondary | ICD-10-CM | POA: Diagnosis not present

## 2022-12-31 DIAGNOSIS — D0512 Intraductal carcinoma in situ of left breast: Secondary | ICD-10-CM | POA: Diagnosis not present

## 2022-12-31 DIAGNOSIS — Z170 Estrogen receptor positive status [ER+]: Secondary | ICD-10-CM | POA: Diagnosis not present

## 2022-12-31 LAB — RAD ONC ARIA SESSION SUMMARY
Course Elapsed Days: 17
Plan Fractions Treated to Date: 13
Plan Prescribed Dose Per Fraction: 2.66 Gy
Plan Total Fractions Prescribed: 15
Plan Total Prescribed Dose: 39.9 Gy
Reference Point Dosage Given to Date: 37.24 Gy
Reference Point Session Dosage Given: 2.66 Gy
Session Number: 14

## 2023-01-03 ENCOUNTER — Other Ambulatory Visit: Payer: Self-pay

## 2023-01-03 ENCOUNTER — Ambulatory Visit
Admission: RE | Admit: 2023-01-03 | Discharge: 2023-01-03 | Disposition: A | Discharge status: Home / Self Care | Payer: Medicare Other | Source: Ambulatory Visit | Attending: Radiation Oncology | Admitting: Radiation Oncology

## 2023-01-03 DIAGNOSIS — D0512 Intraductal carcinoma in situ of left breast: Secondary | ICD-10-CM | POA: Diagnosis not present

## 2023-01-03 DIAGNOSIS — Z170 Estrogen receptor positive status [ER+]: Secondary | ICD-10-CM | POA: Diagnosis not present

## 2023-01-03 DIAGNOSIS — Z510 Encounter for antineoplastic radiation therapy: Secondary | ICD-10-CM | POA: Diagnosis not present

## 2023-01-03 LAB — RAD ONC ARIA SESSION SUMMARY
Course Elapsed Days: 20
Plan Fractions Treated to Date: 14
Plan Prescribed Dose Per Fraction: 2.66 Gy
Plan Total Fractions Prescribed: 15
Plan Total Prescribed Dose: 39.9 Gy
Reference Point Dosage Given to Date: 39.9 Gy
Reference Point Session Dosage Given: 2.66 Gy
Session Number: 15

## 2023-01-04 ENCOUNTER — Other Ambulatory Visit: Payer: Self-pay

## 2023-01-04 ENCOUNTER — Ambulatory Visit
Admission: RE | Admit: 2023-01-04 | Discharge: 2023-01-04 | Disposition: A | Discharge status: Home / Self Care | Payer: Medicare Other | Source: Ambulatory Visit | Attending: Radiation Oncology | Admitting: Radiation Oncology

## 2023-01-04 DIAGNOSIS — Z170 Estrogen receptor positive status [ER+]: Secondary | ICD-10-CM | POA: Diagnosis not present

## 2023-01-04 DIAGNOSIS — Z510 Encounter for antineoplastic radiation therapy: Secondary | ICD-10-CM | POA: Diagnosis not present

## 2023-01-04 DIAGNOSIS — D0512 Intraductal carcinoma in situ of left breast: Secondary | ICD-10-CM | POA: Diagnosis not present

## 2023-01-04 LAB — RAD ONC ARIA SESSION SUMMARY
Course Elapsed Days: 21
Plan Fractions Treated to Date: 15
Plan Prescribed Dose Per Fraction: 2.66 Gy
Plan Total Fractions Prescribed: 15
Plan Total Prescribed Dose: 39.9 Gy
Reference Point Dosage Given to Date: 42.56 Gy
Reference Point Session Dosage Given: 2.66 Gy
Session Number: 16

## 2023-01-05 ENCOUNTER — Ambulatory Visit: Payer: Medicare Other | Admitting: Orthopaedic Surgery

## 2023-01-05 ENCOUNTER — Ambulatory Visit
Admission: RE | Admit: 2023-01-05 | Discharge: 2023-01-05 | Disposition: A | Discharge status: Home / Self Care | Payer: Medicare Other | Source: Ambulatory Visit | Attending: Radiation Oncology | Admitting: Radiation Oncology

## 2023-01-05 ENCOUNTER — Other Ambulatory Visit: Payer: Self-pay

## 2023-01-05 DIAGNOSIS — Z510 Encounter for antineoplastic radiation therapy: Secondary | ICD-10-CM | POA: Diagnosis not present

## 2023-01-05 DIAGNOSIS — Z170 Estrogen receptor positive status [ER+]: Secondary | ICD-10-CM | POA: Diagnosis not present

## 2023-01-05 DIAGNOSIS — D0512 Intraductal carcinoma in situ of left breast: Secondary | ICD-10-CM | POA: Diagnosis not present

## 2023-01-05 LAB — RAD ONC ARIA SESSION SUMMARY
Course Elapsed Days: 22
Plan Fractions Treated to Date: 1
Plan Prescribed Dose Per Fraction: 2 Gy
Plan Total Fractions Prescribed: 4
Plan Total Prescribed Dose: 8 Gy
Reference Point Dosage Given to Date: 2 Gy
Reference Point Session Dosage Given: 2 Gy
Session Number: 17

## 2023-01-06 ENCOUNTER — Other Ambulatory Visit: Payer: Self-pay

## 2023-01-06 ENCOUNTER — Ambulatory Visit
Admission: RE | Admit: 2023-01-06 | Discharge: 2023-01-06 | Disposition: A | Discharge status: Home / Self Care | Payer: Medicare Other | Source: Ambulatory Visit | Attending: Radiation Oncology | Admitting: Radiation Oncology

## 2023-01-06 DIAGNOSIS — D0512 Intraductal carcinoma in situ of left breast: Secondary | ICD-10-CM | POA: Diagnosis not present

## 2023-01-06 DIAGNOSIS — Z170 Estrogen receptor positive status [ER+]: Secondary | ICD-10-CM | POA: Diagnosis not present

## 2023-01-06 DIAGNOSIS — Z510 Encounter for antineoplastic radiation therapy: Secondary | ICD-10-CM | POA: Diagnosis not present

## 2023-01-06 LAB — RAD ONC ARIA SESSION SUMMARY
Course Elapsed Days: 23
Plan Fractions Treated to Date: 2
Plan Prescribed Dose Per Fraction: 2 Gy
Plan Total Fractions Prescribed: 4
Plan Total Prescribed Dose: 8 Gy
Reference Point Dosage Given to Date: 4 Gy
Reference Point Session Dosage Given: 2 Gy
Session Number: 18

## 2023-01-07 ENCOUNTER — Ambulatory Visit
Admission: RE | Admit: 2023-01-07 | Discharge: 2023-01-07 | Disposition: A | Discharge status: Home / Self Care | Payer: Medicare Other | Source: Ambulatory Visit | Attending: Radiation Oncology | Admitting: Radiation Oncology

## 2023-01-07 ENCOUNTER — Other Ambulatory Visit: Payer: Self-pay

## 2023-01-07 DIAGNOSIS — Z510 Encounter for antineoplastic radiation therapy: Secondary | ICD-10-CM | POA: Diagnosis not present

## 2023-01-07 DIAGNOSIS — D0512 Intraductal carcinoma in situ of left breast: Secondary | ICD-10-CM | POA: Diagnosis not present

## 2023-01-07 DIAGNOSIS — Z170 Estrogen receptor positive status [ER+]: Secondary | ICD-10-CM | POA: Diagnosis not present

## 2023-01-07 LAB — RAD ONC ARIA SESSION SUMMARY
Course Elapsed Days: 24
Plan Fractions Treated to Date: 3
Plan Prescribed Dose Per Fraction: 2 Gy
Plan Total Fractions Prescribed: 4
Plan Total Prescribed Dose: 8 Gy
Reference Point Dosage Given to Date: 6 Gy
Reference Point Session Dosage Given: 2 Gy
Session Number: 19

## 2023-01-10 ENCOUNTER — Encounter: Payer: Self-pay | Admitting: *Deleted

## 2023-01-10 ENCOUNTER — Other Ambulatory Visit: Payer: Self-pay

## 2023-01-10 ENCOUNTER — Ambulatory Visit
Admission: RE | Admit: 2023-01-10 | Discharge: 2023-01-10 | Disposition: A | Discharge status: Home / Self Care | Payer: Medicare Other | Source: Ambulatory Visit | Attending: Radiation Oncology | Admitting: Radiation Oncology

## 2023-01-10 DIAGNOSIS — Z510 Encounter for antineoplastic radiation therapy: Secondary | ICD-10-CM | POA: Diagnosis not present

## 2023-01-10 DIAGNOSIS — D0512 Intraductal carcinoma in situ of left breast: Secondary | ICD-10-CM | POA: Diagnosis not present

## 2023-01-10 DIAGNOSIS — Z170 Estrogen receptor positive status [ER+]: Secondary | ICD-10-CM | POA: Diagnosis not present

## 2023-01-10 LAB — RAD ONC ARIA SESSION SUMMARY
Course Elapsed Days: 27
Plan Fractions Treated to Date: 4
Plan Prescribed Dose Per Fraction: 2 Gy
Plan Total Fractions Prescribed: 4
Plan Total Prescribed Dose: 8 Gy
Reference Point Dosage Given to Date: 8 Gy
Reference Point Session Dosage Given: 2 Gy
Session Number: 20

## 2023-01-11 ENCOUNTER — Other Ambulatory Visit: Payer: Self-pay | Admitting: Radiation Oncology

## 2023-01-12 NOTE — Radiation Completion Notes (Signed)
  Radiation Oncology         [phone] ________________________________  Name: Sheila Hartman MRN: [id number]  Date of Service: 01/11/2023  DOB: 1951/05/22  End of Treatment Note     Diagnosis:  Intermediate grade, ER/PR positive DCIS of the left breast.   Intent: Curative     ==========DELIVERED PLANS==========  First Treatment Date: 2022-12-14 - Last Treatment Date: 2023-01-10   Plan Name: Breast_L Site: Breast, Left Technique: 3D Mode: Photon Dose Per Fraction: 2.66 Gy Prescribed Dose (Delivered / Prescribed): 2.66 Gy / 2.66 Gy Prescribed Fxs (Delivered / Prescribed): 1 / 1   Plan Name: Breast_L:1 Site: Breast, Left Technique: 3D Mode: Photon Dose Per Fraction: 2.66 Gy Prescribed Dose (Delivered / Prescribed): 39.9 Gy / 39.9 Gy Prescribed Fxs (Delivered / Prescribed): 15 / 15   Plan Name: Breast_L_Bst Site: Breast, Left Technique: 3D Mode: Photon Dose Per Fraction: 2 Gy Prescribed Dose (Delivered / Prescribed): 8 Gy / 8 Gy Prescribed Fxs (Delivered / Prescribed): 4 / 4     ==========ON TREATMENT VISIT DATES========== 2022-12-17, 2022-12-24, 2022-12-31, 2023-01-07     See weekly On Treatment Notes is Epic for details.     The patient tolerated radiation. She developed fatigue and anticipated skin changes in the treatment field.   The patient will receive a call in about one month from the radiation oncology department. She will continue follow up with Dr. Al Pimple as well.      Osker Mason, PAC

## 2023-01-26 ENCOUNTER — Ambulatory Visit: Payer: Medicare Other | Admitting: Orthopaedic Surgery

## 2023-01-26 VITALS — Ht 68.0 in | Wt 294.0 lb

## 2023-01-26 DIAGNOSIS — G8929 Other chronic pain: Secondary | ICD-10-CM | POA: Diagnosis not present

## 2023-01-26 DIAGNOSIS — M1711 Unilateral primary osteoarthritis, right knee: Secondary | ICD-10-CM | POA: Diagnosis not present

## 2023-01-26 DIAGNOSIS — M25561 Pain in right knee: Secondary | ICD-10-CM | POA: Diagnosis not present

## 2023-01-26 NOTE — Progress Notes (Signed)
The patient is well-known to Sheila Hartman.  She has debilitating arthritis in her right knee this been well-documented.  What has kept Sheila Hartman from proceeding with surgery is unfortunately related to her weight and her BMI.  We last saw her in January and her BMI is 44.  Her BMI is still 44 today.  She has been having a lot going on she states.  She has dealt with breast cancer but now that is been taken care of and treated successfully.  She does ambulate with a cane.  At this point sterile injections have really not helped her at all.  On examination of her right knee she has significant limitations in flexion of that knee.  I cannot even flex her close 90 degrees.  There is a decent soft tissue envelope around the right knee but not insurmountable in terms of knee replacement surgery.  However it is more prudent to have her BMI below 40 before proceeding with surgery and this is an insurance requirement as well and she understands that.  I encouraged about weight loss and talking to her primary care physician about this.  We can see her back in 6 months with a repeat weight and BMI calculation then.
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
## 2023-01-29 NOTE — Assessment & Plan Note (Signed)
This is a very pleasant 72 year old postmenopausal female patient with newly diagnosed left breast DCIS, ER/PR positive referred to medical oncology for recommendations.  She underwent left breast lumpectomy, final pathology showed grade 2 DCIS and an intraductal papilloma, negative margins.  She has now completed radiation on April 22.  We previously reviewed mechanism of action of anastrozole, adverse effects of anastrozole including but not limited to postmenopausal symptoms, arthralgias, bone density loss etc.  Repeat bone density ordered, last bone density in 2021 was normal.  She is now post radiation and is here to discuss once again about anti estrogen therapy.

## 2023-01-31 ENCOUNTER — Other Ambulatory Visit: Payer: Self-pay

## 2023-01-31 ENCOUNTER — Inpatient Hospital Stay: Payer: Medicare Other | Attending: Hematology and Oncology | Admitting: Hematology and Oncology

## 2023-01-31 VITALS — BP 152/86 | HR 104 | Temp 98.6°F | Resp 15 | Wt 294.9 lb

## 2023-01-31 DIAGNOSIS — Z803 Family history of malignant neoplasm of breast: Secondary | ICD-10-CM | POA: Insufficient documentation

## 2023-01-31 DIAGNOSIS — D0512 Intraductal carcinoma in situ of left breast: Secondary | ICD-10-CM | POA: Insufficient documentation

## 2023-01-31 DIAGNOSIS — Z923 Personal history of irradiation: Secondary | ICD-10-CM | POA: Insufficient documentation

## 2023-01-31 DIAGNOSIS — Z801 Family history of malignant neoplasm of trachea, bronchus and lung: Secondary | ICD-10-CM | POA: Insufficient documentation

## 2023-01-31 DIAGNOSIS — Z800 Family history of malignant neoplasm of digestive organs: Secondary | ICD-10-CM | POA: Diagnosis not present

## 2023-01-31 DIAGNOSIS — Z87891 Personal history of nicotine dependence: Secondary | ICD-10-CM | POA: Diagnosis not present

## 2023-01-31 DIAGNOSIS — Z79811 Long term (current) use of aromatase inhibitors: Secondary | ICD-10-CM | POA: Diagnosis not present

## 2023-01-31 MED ORDER — ANASTROZOLE 1 MG PO TABS: 1.0000 mg | ORAL_TABLET | Freq: Every day | ORAL | 3 refills | Status: DC

## 2023-02-25 DIAGNOSIS — H43812 Vitreous degeneration, left eye: Secondary | ICD-10-CM | POA: Diagnosis not present

## 2023-02-25 DIAGNOSIS — H35412 Lattice degeneration of retina, left eye: Secondary | ICD-10-CM | POA: Diagnosis not present

## 2023-02-25 DIAGNOSIS — Q141 Congenital malformation of retina: Secondary | ICD-10-CM | POA: Diagnosis not present

## 2023-02-25 DIAGNOSIS — Z961 Presence of intraocular lens: Secondary | ICD-10-CM | POA: Diagnosis not present

## 2023-02-25 DIAGNOSIS — E70319 Ocular albinism, unspecified: Secondary | ICD-10-CM | POA: Diagnosis not present

## 2023-03-07 ENCOUNTER — Ambulatory Visit
Admission: RE | Admit: 2023-03-07 | Discharge: 2023-03-07 | Disposition: A | Discharge status: Home / Self Care | Payer: Medicare Other | Source: Ambulatory Visit | Attending: Radiation Oncology | Admitting: Radiation Oncology

## 2023-03-07 NOTE — Progress Notes (Addendum)
  Radiation Oncology         [phone]) [phone] ________________________________  Name: Sheila Hartman MRN: [id number]  Date of Service: 03/07/2023  DOB: April 29, 1951  Post Treatment Telephone Note  Diagnosis:   Intermediate grade, ER/PR positive DCIS of the left breast. (as documented in provider EOT note) (as documented in provider EOT note)   The patient was available for call today.   Symptoms of fatigue have improved since completing therapy.  Symptoms of skin changes have improved since completing therapy.  The patient was encouraged to avoid sun exposure in the area of prior treatment for up to one year following radiation with either sunscreen or by the style of clothing worn in the sun.  The patient has a scheduled follow up with Lillard Anes in Survivorship for ongoing surveillance, and was encouraged to call if she develops concerns or questions regarding radiation.   This concludes the interview.   Ruel Favors, LPN

## 2023-03-21 ENCOUNTER — Ambulatory Visit: Payer: Medicare Other | Admitting: Podiatry

## 2023-03-21 ENCOUNTER — Encounter: Payer: Self-pay | Admitting: Podiatry

## 2023-03-21 DIAGNOSIS — M79674 Pain in right toe(s): Secondary | ICD-10-CM

## 2023-03-21 DIAGNOSIS — B351 Tinea unguium: Secondary | ICD-10-CM | POA: Diagnosis not present

## 2023-03-21 DIAGNOSIS — M79675 Pain in left toe(s): Secondary | ICD-10-CM

## 2023-03-21 NOTE — Progress Notes (Signed)
Subjective: Sheila Hartman is a 72 y.o. female patient seen today for follow up of  painful thick toenails that are difficult to trim. Pain interferes with ambulation. Aggravating factors include wearing enclosed shoe gear. Pain is relieved with periodic professional debridement.  New problems reported today: None.  PCP is [place], Betty G, MD. Last visit was: 04/03/2021.  Allergies  Allergen Reactions   Penicillins Hives    Objective: Physical Exam  General: Patient is a pleasant 72 y.o. African American female morbidly obese in NAD. AAO x 3.   Neurovascular Examination: CFT immediate b/l LE. Palpable DP/PT pulses b/l LE. Digital hair sparse b/l. Skin temperature gradient WNL b/l. No pain with calf compression b/l. No edema noted b/l. No cyanosis or clubbing noted b/l LE.  Protective sensation intact 5/5 intact bilaterally with 10g monofilament b/l. Vibratory sensation intact b/l.  Dermatological:  Pedal integument with normal turgor, texture and tone b/l LE. No open wounds b/l. No interdigital macerations b/l. Toenails 1-5 b/l elongated, thickened, discolored with subungual debris. +Tenderness with dorsal palpation of nailplates. No hyperkeratotic or porokeratotic lesions present.  Musculoskeletal:  Normal muscle strength 5/5 to all lower extremity muscle groups bilaterally. HAV with bunion deformity noted b/l LE. Hammertoe(s) noted to the R 2nd toe.Marland Kitchen No pain, crepitus or joint limitation noted with ROM b/l LE.  Patient ambulates independently without assistive aids.  Assessment: 1. Pain due to onychomycosis of toenails of both feet     Plan: Patient was evaluated and treated and all questions answered. Consent given for treatment as described below: -Patient to continue soft, supportive shoe gear daily. -Mycotic toenails 1-5 bilaterally were debrided in length and girth with sterile nail nippers and dremel without incident. -Patient/POA to call should there be  question/concern in the interim.  No follow-ups on file.  Louann Sjogren, DPM

## 2023-04-05 ENCOUNTER — Telehealth: Payer: Medicare Other | Admitting: Family Medicine

## 2023-04-05 DIAGNOSIS — Z0000 Encounter for general adult medical examination without abnormal findings: Secondary | ICD-10-CM | POA: Diagnosis not present

## 2023-04-05 NOTE — Patient Instructions (Addendum)
I really enjoyed getting to talk with you today! I am available on Tuesdays and Thursdays for virtual visits if you have any questions or concerns, or if I can be of any further assistance.   CHECKLIST FROM ANNUAL WELLNESS VISIT:  -Follow up (please call to schedule if not scheduled after visit):   -yearly for annual wellness visit with primary care office  Here is a list of your preventive care/health maintenance measures and the plan for each if any are due:  PLAN For any measures below that may be due: -can get vaccines at the pharmacy  Health Maintenance  Topic Date Due   DTaP/Tdap/Td (1 - Tdap) Never done   Zoster Vaccines- Shingrix (1 of 2) Never done   COVID-19 Vaccine (3 - Pfizer risk series) 01/11/2020   INFLUENZA VACCINE  04/21/2023   MAMMOGRAM  08/03/2023   Medicare Annual Wellness (AWV)  04/04/2024   Colonoscopy  10/23/2027   Pneumonia Vaccine 89+ Years old  Completed   DEXA SCAN  Completed   Hepatitis C Screening  Completed   HPV VACCINES  Aged Out    -See a dentist at least yearly  -Get your eyes checked and then per your eye specialist's recommendations  -Other issues addressed today:   -I have included below further information regarding a healthy whole foods based diet, physical activity guidelines for adults, stress management and opportunities for social connections. I hope you find this information useful.   -----------------------------------------------------------------------------------------------------------------------------------------------------------------------------------------------------------------------------------------------------------  NUTRITION: -eat real food: lots of colorful vegetables (half the plate) and fruits -5-7 servings of vegetables and fruits per day (fresh or steamed is best), exp. 2 servings of vegetables with lunch and dinner and 2 servings of fruit per day. Berries and greens such as kale and collards are great  choices.  -consume on a regular basis: whole grains (make sure first ingredient on label contains the word "whole"), fresh fruits, fish, nuts, seeds, healthy oils (such as olive oil, avocado oil, grape seed oil) -may eat small amounts of dairy and lean meat on occasion, but avoid processed meats such as ham, bacon, lunch meat, etc. -drink water -try to avoid fast food and pre-packaged foods, processed meat -most experts advise limiting sodium to < 2300mg  per day, should limit further is any chronic conditions such as high blood pressure, heart disease, diabetes, etc. The American Heart Association advised that < 1500mg  is is ideal -try to avoid foods that contain any ingredients with names you do not recognize  -try to avoid sugar/sweets (except for the natural sugar that occurs in fresh fruit) -try to avoid sweet drinks -try to avoid white rice, white bread, pasta (unless whole grain), white or yellow potatoes  EXERCISE GUIDELINES FOR ADULTS: -if you wish to increase your physical activity, do so gradually and with the approval of your doctor -STOP and seek medical care immediately if you have any chest pain, chest discomfort or trouble breathing when starting or increasing exercise  -move and stretch your body, legs, feet and arms when sitting for long periods -Physical activity guidelines for optimal health in adults: -least 150 minutes per week of aerobic exercise (can talk, but not sing) once approved by your doctor, 20-30 minutes of sustained activity or two 10 minute episodes of sustained activity every day.  -resistance training at least 2 days per week if approved by your doctor -balance exercises 3+ days per week:   Stand somewhere where you have something sturdy to hold onto if you lose balance.  1) lift up on toes, start with 5x per day and work up to 20x   2) stand and lift on leg straight out to the side so that foot is a few inches of the floor, start with 5x each side and work  up to 20x each side   3) stand on one foot, start with 5 seconds each side and work up to 20 seconds on each side  If you need ideas or help with getting more active:  -Silver sneakers https://tools.silversneakers.com  -Walk with a Doc: http://www.duncan-[email or web address]  -try to include resistance (weight lifting/strength building) and balance exercises twice per week: or the following link for ideas: http://castillo-powell.com/  BuyDucts.dk  STRESS MANAGEMENT: -can try meditating, or just sitting quietly with deep breathing while intentionally relaxing all parts of your body for 5 minutes daily -if you need further help with stress, anxiety or depression please follow up with your primary doctor or contact the wonderful folks at [facility]: [phone]  SOCIAL CONNECTIONS: -options in [place] if you wish to engage in more social and exercise related activities:  -Silver sneakers https://tools.silversneakers.com  -Walk with a Doc: http://www.duncan-[email or web address]  -Check out the [facility] Active Adults 50+ section on the [place] [employer] (hiking clubs, book clubs, cards and games, chess, exercise classes, aquatic classes and much more) - see the website for details: https://www.[place].gov/departments/parks-recreation/active-adults50  -YouTube has lots of exercise videos for different ages and abilities as well  -[facility] (a variety of indoor and outdoor inperson activities for adults). [phone]. [address].  -Virtual Online Classes (a variety of topics): see seniorplanet.org or call [phone]  -consider volunteering at a school, hospice center, church, senior center or elsewhere  ADVANCED HEALTHCARE DIRECTIVES:  Everyone should have advanced health care directives in place. This is so that you get the care you want, should you ever  be in a situation where you are unable to make your own medical decisions.   From the [place] Advanced Directive Website: "Advance Health Care Directives are legal documents in which you give written instructions about your health care if, in the future, you cannot speak for yourself.   A health care power of attorney allows you to name a person you trust to make your health care decisions if you cannot make them yourself. A declaration of a desire for a natural death (or living will) is document, which states that you desire not to have your life prolonged by extraordinary measures if you have a terminal or incurable illness or if you are in a vegetative state. An advance instruction for mental health treatment makes a declaration of instructions, information and preferences regarding your mental health treatment. It also states that you are aware that the advance instruction authorizes a mental health treatment provider to act according to your wishes. It may also outline your consent or refusal of mental health treatment. A declaration of an anatomical gift allows anyone over the age of 36 to make a gift by will, organ donor card or other document."   Please see the following website or an elder law attorney for forms, FAQs and for completion of advanced directives: [place] Health Care Directives Advance Health Care Directives (http://guzman.[email or web address]/)  Or copy and paste the following to your web browser: PoshChat.fi

## 2023-04-05 NOTE — Progress Notes (Signed)
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 health treatment provider to act according to your wishes. It may also outline your consent or refusal of mental health treatment. A declaration of an anatomical gift allows anyone over the age of 55 to make a gift by will, organ donor card or other document."   Please see the following website or an elder law attorney for forms, FAQs and for completion of advanced directives: [place]  TEFL teacher Health Care Directives Advance Health Care Directives ([email or web address]  Or copy and paste the following to your web browser: [email or web address]           Terressa Koyanagi, DO
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
## 2023-04-20 ENCOUNTER — Encounter: Payer: Self-pay | Admitting: Family Medicine

## 2023-04-20 ENCOUNTER — Encounter (INDEPENDENT_AMBULATORY_CARE_PROVIDER_SITE_OTHER): Payer: Self-pay

## 2023-04-20 ENCOUNTER — Ambulatory Visit (INDEPENDENT_AMBULATORY_CARE_PROVIDER_SITE_OTHER): Payer: Medicare Other

## 2023-04-20 ENCOUNTER — Ambulatory Visit: Payer: Medicare Other | Admitting: Family Medicine

## 2023-04-20 VITALS — BP 124/80 | HR 60 | Temp 98.7°F | Resp 16 | Ht 68.0 in | Wt 296.0 lb

## 2023-04-20 DIAGNOSIS — K219 Gastro-esophageal reflux disease without esophagitis: Secondary | ICD-10-CM

## 2023-04-20 DIAGNOSIS — E559 Vitamin D deficiency, unspecified: Secondary | ICD-10-CM

## 2023-04-20 DIAGNOSIS — R053 Chronic cough: Secondary | ICD-10-CM

## 2023-04-20 DIAGNOSIS — I10 Essential (primary) hypertension: Secondary | ICD-10-CM

## 2023-04-20 DIAGNOSIS — E785 Hyperlipidemia, unspecified: Secondary | ICD-10-CM | POA: Diagnosis not present

## 2023-04-20 DIAGNOSIS — R7303 Prediabetes: Secondary | ICD-10-CM

## 2023-04-20 DIAGNOSIS — F419 Anxiety disorder, unspecified: Secondary | ICD-10-CM

## 2023-04-20 DIAGNOSIS — R059 Cough, unspecified: Secondary | ICD-10-CM | POA: Diagnosis not present

## 2023-04-20 LAB — COMPREHENSIVE METABOLIC PANEL
ALT: 22 U/L (ref 0–35)
AST: 25 U/L (ref 0–37)
Albumin: 4.5 g/dL (ref 3.5–5.2)
Alkaline Phosphatase: 115 U/L (ref 39–117)
BUN: 20 mg/dL (ref 6–23)
CO2: 30 mEq/L (ref 19–32)
Calcium: 10.3 mg/dL (ref 8.4–10.5)
Chloride: 103 mEq/L (ref 96–112)
Creatinine, Ser: 0.96 mg/dL (ref 0.40–1.20)
GFR: 60.2 mL/min (ref >60.00)
Glucose, Bld: 103 mg/dL — ABNORMAL HIGH (ref 70–99)
Potassium: 4.1 mEq/L (ref 3.5–5.1)
Sodium: 142 mEq/L (ref 135–145)
Total Bilirubin: 0.4 mg/dL (ref 0.2–1.2)
Total Protein: 8 g/dL (ref 6.0–8.3)

## 2023-04-20 LAB — LIPID PANEL
Cholesterol: 157 mg/dL (ref 0–200)
HDL: 48.1 mg/dL (ref >39.00)
LDL Cholesterol: 84 mg/dL (ref 0–99)
NonHDL: 108.81
Total CHOL/HDL Ratio: 3
Triglycerides: 124 mg/dL (ref 0.0–149.0)
VLDL: 24.8 mg/dL (ref 0.0–40.0)

## 2023-04-20 LAB — CBC WITH DIFFERENTIAL/PLATELET
Basophils Absolute: 0 10*3/uL (ref 0.0–0.1)
Basophils Relative: 0.7 % (ref 0.0–3.0)
Eosinophils Absolute: 0.1 10*3/uL (ref 0.0–0.7)
Eosinophils Relative: 1.7 % (ref 0.0–5.0)
HCT: 41 % (ref 36.0–46.0)
Hemoglobin: 12.8 g/dL (ref 12.0–15.0)
Lymphocytes Relative: 10.1 % — ABNORMAL LOW (ref 12.0–46.0)
Lymphs Abs: 0.7 10*3/uL (ref 0.7–4.0)
MCHC: 31.2 g/dL (ref 30.0–36.0)
MCV: 81 fl (ref 78.0–100.0)
Monocytes Absolute: 0.7 10*3/uL (ref 0.1–1.0)
Monocytes Relative: 10.7 % (ref 3.0–12.0)
Neutro Abs: 5.4 10*3/uL (ref 1.4–7.7)
Neutrophils Relative %: 76.8 % (ref 43.0–77.0)
Platelets: 281 10*3/uL (ref 150.0–400.0)
RBC: 5.06 Mil/uL (ref 3.87–5.11)
RDW: 15.8 % — ABNORMAL HIGH (ref 11.5–15.5)
WBC: 7 10*3/uL (ref 4.0–10.5)

## 2023-04-20 LAB — HEMOGLOBIN A1C: Hgb A1c MFr Bld: 5.9 % (ref 4.6–6.5)

## 2023-04-20 LAB — VITAMIN D 25 HYDROXY (VIT D DEFICIENCY, FRACTURES): VITD: 45.71 ng/mL (ref 30.00–100.00)

## 2023-04-20 MED ORDER — BENZONATATE 100 MG PO CAPS
100.0000 mg | ORAL_CAPSULE | Freq: Two times a day (BID) | ORAL | 0 refills | Status: AC | PRN
Start: 2023-04-20 — End: 2023-04-30

## 2023-04-20 NOTE — Patient Instructions (Addendum)
A few things to remember from today's visit:  Hypertension, essential, benign - Plan: CBC with Differential/Platelet, Comprehensive metabolic panel  Gastroesophageal reflux disease without esophagitis  Prediabetes - Plan: Hemoglobin A1c  Vitamin D deficiency, unspecified - Plan: VITAMIN D 25 Hydroxy (Vit-D Deficiency, Fractures)  Dyslipidemia - Plan: Lipid panel  Persistent cough - Plan: DG Chest 2 View, benzonatate (TESSALON) 100 MG capsule  Plain Mucinex may help. Adequate hydration. Check blood pressure at home.  If you need refills for medications you take chronically, please call your pharmacy. Do not use My Chart to request refills or for acute issues that need immediate attention. If you send a my chart message, it may take a few days to be addressed, specially if I am not in the office.  Please be sure medication list is accurate. If a new problem present, please set up appointment sooner than planned today.

## 2023-04-21 ENCOUNTER — Other Ambulatory Visit: Payer: Self-pay | Admitting: Family Medicine

## 2023-04-21 ENCOUNTER — Telehealth: Payer: Self-pay | Admitting: Adult Health

## 2023-04-21 DIAGNOSIS — M159 Polyosteoarthritis, unspecified: Secondary | ICD-10-CM

## 2023-04-21 DIAGNOSIS — G8929 Other chronic pain: Secondary | ICD-10-CM

## 2023-04-21 NOTE — Telephone Encounter (Signed)
Rescheduled appointment per room/resource. Left voicemail for patient.

## 2023-04-22 ENCOUNTER — Telehealth: Payer: Self-pay | Admitting: Gastroenterology

## 2023-04-22 ENCOUNTER — Telehealth: Payer: Self-pay | Admitting: Radiation Oncology

## 2023-04-22 NOTE — Telephone Encounter (Signed)
Pt states she has been taking 2000iu vit D. Discussed with pt that per prescription she was to take the Vit d 50,000 weekly for 8 weeks and then 800iu daily. Pt will start the vit d 800iu daily.

## 2023-04-22 NOTE — Telephone Encounter (Signed)
Inbound call form patient requesting a call from the nurse to discuss her Vitamin D medication. Please advise.

## 2023-04-22 NOTE — Telephone Encounter (Signed)
8/2 @ 9:29 am follow up call to patient after receiving message from call center, that patient was returning missed call from our office.  No calls from our office.  Patient stated it may have been to remind her of her upcoming appointment.  Patient was grateful for the call back.

## 2023-04-23 ENCOUNTER — Encounter: Payer: Self-pay | Admitting: Family Medicine

## 2023-04-23 NOTE — Assessment & Plan Note (Signed)
Encouraged a healthier life style for diabetes prevention.

## 2023-04-23 NOTE — Assessment & Plan Note (Signed)
Continue Pravastatin 10 mg daily, low fat diet also recommended. She is fasting today.

## 2023-04-23 NOTE — Assessment & Plan Note (Signed)
Problem is well controlled. She took Sertraline before, changed to Duloxetine 60 mg daily to also help with OA. No changes today.

## 2023-04-23 NOTE — Assessment & Plan Note (Signed)
This problem cough be contributing to some of her symptoms (sore throat and cough). Continue Omeprazole same dose. GERD precautions also recommended.

## 2023-04-23 NOTE — Assessment & Plan Note (Signed)
BP adequately controlled. Continue Amlodipine-Benazepril 5-20 mg daily as well as low salt diet.

## 2023-04-23 NOTE — Assessment & Plan Note (Signed)
Continue OTC vit D supplementation. Further recommendations according to 25 OH vit D result.
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
## 2023-04-28 ENCOUNTER — Ambulatory Visit (INDEPENDENT_AMBULATORY_CARE_PROVIDER_SITE_OTHER): Payer: Medicare Other | Admitting: Internal Medicine

## 2023-04-28 ENCOUNTER — Encounter: Payer: Self-pay | Admitting: Internal Medicine

## 2023-04-28 VITALS — BP 126/98 | HR 95 | Temp 98.2°F | Ht 68.0 in | Wt 293.4 lb

## 2023-04-28 DIAGNOSIS — R053 Chronic cough: Secondary | ICD-10-CM | POA: Diagnosis not present

## 2023-04-28 DIAGNOSIS — J029 Acute pharyngitis, unspecified: Secondary | ICD-10-CM | POA: Diagnosis not present

## 2023-04-28 MED ORDER — AZITHROMYCIN 250 MG PO TABS: ORAL_TABLET | ORAL | 0 refills | Status: AC

## 2023-04-28 MED ORDER — PREDNISONE 50 MG PO TABS: 50.0000 mg | ORAL_TABLET | Freq: Every day | ORAL | 0 refills | Status: DC

## 2023-04-28 NOTE — Patient Instructions (Signed)
Adding trial antibiotic and short course prednisone . If not improving in another 10 -14 days or if worse  plan fu .   Change cough drops to sugar free candy Stay on your omeprazole in case  reflux is adding to the coughing

## 2023-05-01 ENCOUNTER — Other Ambulatory Visit: Payer: Self-pay | Admitting: Family Medicine

## 2023-05-01 DIAGNOSIS — K219 Gastro-esophageal reflux disease without esophagitis: Secondary | ICD-10-CM

## 2023-05-02 ENCOUNTER — Other Ambulatory Visit: Payer: Self-pay | Admitting: *Deleted

## 2023-05-02 MED ORDER — ANASTROZOLE 1 MG PO TABS: 1.0000 mg | ORAL_TABLET | Freq: Every day | ORAL | 3 refills | Status: DC

## 2023-05-03 ENCOUNTER — Encounter: Payer: Medicare Other | Admitting: Adult Health

## 2023-05-10 ENCOUNTER — Inpatient Hospital Stay: Payer: Medicare Other | Attending: Hematology and Oncology | Admitting: Adult Health

## 2023-05-10 ENCOUNTER — Encounter: Payer: Self-pay | Admitting: Adult Health

## 2023-05-10 VITALS — BP 121/85 | HR 100 | Temp 98.3°F | Resp 18 | Ht 68.0 in | Wt 293.8 lb

## 2023-05-10 DIAGNOSIS — Z79811 Long term (current) use of aromatase inhibitors: Secondary | ICD-10-CM | POA: Insufficient documentation

## 2023-05-10 DIAGNOSIS — Z170 Estrogen receptor positive status [ER+]: Secondary | ICD-10-CM | POA: Insufficient documentation

## 2023-05-10 DIAGNOSIS — Z923 Personal history of irradiation: Secondary | ICD-10-CM | POA: Insufficient documentation

## 2023-05-10 DIAGNOSIS — D0512 Intraductal carcinoma in situ of left breast: Secondary | ICD-10-CM | POA: Diagnosis not present
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
## 2023-05-11 ENCOUNTER — Telehealth: Payer: Self-pay | Admitting: Adult Health

## 2023-05-11 NOTE — Telephone Encounter (Signed)
Scheduled appointment per 8/20 los. Left voicemail with appointment details.

## 2023-05-17 DIAGNOSIS — Z0000 Encounter for general adult medical examination without abnormal findings: Secondary | ICD-10-CM | POA: Insufficient documentation

## 2023-05-17 NOTE — Patient Instructions (Incomplete)
A few things to remember from today's visit:  Routine general medical examination at a health care facility  Hypertension, essential, benign  Essential hypertension  MGUS (monoclonal gammopathy of unknown significance) - Plan: Multiple Myeloma Panel (SPEP&IFE w/QIG)  Cardiomegaly - Plan: ECHOCARDIOGRAM COMPLETE  Monitor blood pressure at home, should be under 140/90, ideally at 130/80 or less.  If you need refills for medications you take chronically, please call your pharmacy. Do not use My Chart to request refills or for acute issues that need immediate attention. If you send a my chart message, it may take a few days to be addressed, specially if I am not in the office.  Please be sure medication list is accurate. If a new problem present, please set up appointment sooner than planned today.
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
## 2023-05-18 ENCOUNTER — Encounter: Payer: Self-pay | Admitting: Family Medicine

## 2023-05-18 ENCOUNTER — Other Ambulatory Visit: Payer: Self-pay | Admitting: Family Medicine

## 2023-05-18 ENCOUNTER — Ambulatory Visit (INDEPENDENT_AMBULATORY_CARE_PROVIDER_SITE_OTHER): Payer: Medicare Other | Admitting: Family Medicine

## 2023-05-18 ENCOUNTER — Telehealth (HOSPITAL_BASED_OUTPATIENT_CLINIC_OR_DEPARTMENT_OTHER): Payer: Self-pay | Admitting: *Deleted

## 2023-05-18 VITALS — BP 140/90 | HR 100 | Temp 98.5°F | Resp 16 | Ht 68.0 in | Wt 293.1 lb

## 2023-05-18 DIAGNOSIS — I517 Cardiomegaly: Secondary | ICD-10-CM | POA: Diagnosis not present

## 2023-05-18 DIAGNOSIS — Z0000 Encounter for general adult medical examination without abnormal findings: Secondary | ICD-10-CM | POA: Diagnosis not present

## 2023-05-18 DIAGNOSIS — D472 Monoclonal gammopathy: Secondary | ICD-10-CM | POA: Diagnosis not present

## 2023-05-18 DIAGNOSIS — I10 Essential (primary) hypertension: Secondary | ICD-10-CM

## 2023-05-18 DIAGNOSIS — M159 Polyosteoarthritis, unspecified: Secondary | ICD-10-CM

## 2023-05-18 DIAGNOSIS — F419 Anxiety disorder, unspecified: Secondary | ICD-10-CM

## 2023-05-18 DIAGNOSIS — G8929 Other chronic pain: Secondary | ICD-10-CM

## 2023-05-18 MED ORDER — DULOXETINE HCL 60 MG PO CPEP
ORAL_CAPSULE | ORAL | Status: DC
Start: 2023-05-18 — End: 2023-05-19

## 2023-05-18 NOTE — Telephone Encounter (Signed)
Left message for patient to call and schedule the echocardiogram ordered by Dr. Betty [place]

## 2023-05-18 NOTE — Assessment & Plan Note (Signed)
Re-checked 1240/90. It has been lower during recent OV's. Recommend monitoring BP at home. F/U in 6 months.

## 2023-05-19 MED ORDER — DULOXETINE HCL 30 MG PO CPEP
30.0000 mg | ORAL_CAPSULE | Freq: Every day | ORAL | 0 refills | Status: DC
Start: 2023-05-19 — End: 2023-11-19

## 2023-05-19 NOTE — Assessment & Plan Note (Signed)
We discussed the importance of regular physical activity and healthy diet for prevention of chronic illness and/or complications. Preventive guidelines reviewed. Vaccination up to date. Ca++ and vit D supplementation to continue. Next CPE in a year. 

## 2023-05-19 NOTE — Assessment & Plan Note (Signed)
Multiple myeloma panel ordered.

## 2023-05-19 NOTE — Assessment & Plan Note (Signed)
It has improved. Will start weaning off medication.

## 2023-05-24 LAB — MULTIPLE MYELOMA PANEL, SERUM
Albumin SerPl Elph-Mcnc: 3.8 g/dL (ref 2.9–4.4)
Albumin/Glob SerPl: 1.2 (ref 0.7–1.7)
Alpha 1: 0.3 g/dL (ref 0.0–0.4)
Alpha2 Glob SerPl Elph-Mcnc: 0.6 g/dL (ref 0.4–1.0)
B-Globulin SerPl Elph-Mcnc: 1.1 g/dL (ref 0.7–1.3)
Gamma Glob SerPl Elph-Mcnc: 1.2 g/dL (ref 0.4–1.8)
Globulin, Total: 3.2 g/dL (ref 2.2–3.9)
IgA/Immunoglobulin A, Serum: 114 mg/dL (ref 87–352)
IgG (Immunoglobin G), Serum: 1284 mg/dL (ref 586–1602)
IgM (Immunoglobulin M), Srm: 55 mg/dL (ref 26–217)
M Protein SerPl Elph-Mcnc: 0.6 g/dL — ABNORMAL HIGH
Total Protein: 7 g/dL (ref 6.0–8.5)

## 2023-06-08 ENCOUNTER — Ambulatory Visit
Admission: RE | Admit: 2023-06-08 | Discharge: 2023-06-08 | Disposition: A | Discharge status: Home / Self Care | Payer: Medicare Other | Source: Ambulatory Visit | Attending: Hematology and Oncology | Admitting: Hematology and Oncology

## 2023-06-08 DIAGNOSIS — D0512 Intraductal carcinoma in situ of left breast: Secondary | ICD-10-CM

## 2023-06-08 DIAGNOSIS — E349 Endocrine disorder, unspecified: Secondary | ICD-10-CM | POA: Diagnosis not present

## 2023-06-09 ENCOUNTER — Ambulatory Visit (INDEPENDENT_AMBULATORY_CARE_PROVIDER_SITE_OTHER): Payer: Medicare Other

## 2023-06-09 DIAGNOSIS — I517 Cardiomegaly: Secondary | ICD-10-CM | POA: Diagnosis not present

## 2023-06-09 LAB — ECHOCARDIOGRAM COMPLETE
Area-P 1/2: 4.8 cm2
S' Lateral: 1.95 cm

## 2023-06-10 ENCOUNTER — Telehealth: Payer: Self-pay

## 2023-06-10 NOTE — Progress Notes (Signed)
06/10/2023  Patient ID: Sheila Hartman, female   DOB: 04-12-1951, 72 y.o.   MRN: [id number]  Contact patient to discuss hypertension. Confirmed patient is taking Amlodipine/Benazepril as directed by PCP.  Does not have a blood pressure monitor at home currently but states she would be able to purchase one over the counter, hopefully in the next 2-3 weeks. Reviewed proper BP home monitoring procedure.  Declines scheduling an appt to come in for BP check at this time.  Sherrill Raring, PharmD Clinical Pharmacist [phone]

## 2023-06-21 ENCOUNTER — Ambulatory Visit: Payer: Medicare Other | Admitting: Podiatry

## 2023-06-23 ENCOUNTER — Telehealth: Payer: Self-pay | Admitting: Family Medicine

## 2023-06-23 DIAGNOSIS — R0989 Other specified symptoms and signs involving the circulatory and respiratory systems: Secondary | ICD-10-CM

## 2023-06-23 NOTE — Telephone Encounter (Signed)
Pt says she received a notification that she may need to have an evaluation with a cardiologist based on results of most recent endocariogram, says if provider feels she needs it to please refer her to cardiology

## 2023-06-24 NOTE — Addendum Note (Signed)
Addended by: Kathreen Devoid on: 06/24/2023 07:18 AM   Modules accepted: Orders

## 2023-06-24 NOTE — Telephone Encounter (Signed)
Referral placed.

## 2023-06-29 ENCOUNTER — Ambulatory Visit: Payer: Medicare Other | Admitting: Podiatry

## 2023-06-29 ENCOUNTER — Encounter: Payer: Self-pay | Admitting: Podiatry

## 2023-06-29 DIAGNOSIS — M79675 Pain in left toe(s): Secondary | ICD-10-CM

## 2023-06-29 DIAGNOSIS — B351 Tinea unguium: Secondary | ICD-10-CM | POA: Diagnosis not present

## 2023-06-29 DIAGNOSIS — M79674 Pain in right toe(s): Secondary | ICD-10-CM | POA: Diagnosis not present

## 2023-06-29 NOTE — Progress Notes (Signed)
Subjective: Sheila Hartman is a 72 y.o. female patient seen today for follow up of  painful thick toenails that are difficult to trim. Pain interferes with ambulation. Aggravating factors include wearing enclosed shoe gear. Pain is relieved with periodic professional debridement.  New problems reported today: None.  PCP is [place], Betty G, MD. Last visit was: 04/03/2021.  Allergies  Allergen Reactions   Penicillins Hives    Objective: Physical Exam  General: Patient is a pleasant 72 y.o. African American female morbidly obese in NAD. AAO x 3.   Neurovascular Examination: CFT immediate b/l LE. Palpable DP/PT pulses b/l LE. Digital hair sparse b/l. Skin temperature gradient WNL b/l. No pain with calf compression b/l. No edema noted b/l. No cyanosis or clubbing noted b/l LE.  Protective sensation intact 5/5 intact bilaterally with 10g monofilament b/l. Vibratory sensation intact b/l.  Dermatological:  Pedal integument with normal turgor, texture and tone b/l LE. No open wounds b/l. No interdigital macerations b/l. Toenails 1-5 b/l elongated, thickened, discolored with subungual debris. +Tenderness with dorsal palpation of nailplates. No hyperkeratotic or porokeratotic lesions present.  Musculoskeletal:  Normal muscle strength 5/5 to all lower extremity muscle groups bilaterally. HAV with bunion deformity noted b/l LE. Hammertoe(s) noted to the R 2nd toe.Marland Kitchen No pain, crepitus or joint limitation noted with ROM b/l LE.  Patient ambulates independently without assistive aids.  Assessment: 1. Pain due to onychomycosis of toenails of both feet     Plan: Patient was evaluated and treated and all questions answered. Consent given for treatment as described below: -Patient to continue soft, supportive shoe gear daily. -Mycotic toenails 1-5 bilaterally were debrided in length and girth with sterile nail nippers and dremel without incident. -Patient/POA to call should there be  question/concern in the interim.  No follow-ups on file.  Louann Sjogren, DPM

## 2023-07-01 ENCOUNTER — Telehealth: Payer: Self-pay | Admitting: Family Medicine

## 2023-07-01 MED ORDER — OMEPRAZOLE 40 MG PO CPDR: 40.0000 mg | DELAYED_RELEASE_CAPSULE | Freq: Every day | ORAL | 3 refills | Status: DC

## 2023-07-01 NOTE — Telephone Encounter (Signed)
Prescription Request  07/01/2023  LOV: 05/18/2023  What is the name of the medication or equipment? omeprazole (PRILOSEC) 40 MG capsule   *Pt states she is completely out of this Rx.   Have you contacted your pharmacy to request a refill? No   Which pharmacy would you like this sent to?  [facility] - [place], [place] -  [address] Phone: [phone]  Fax: [phone]    Patient notified that their request is being sent to the clinical staff for review and that they should receive a response within 2 business days.   Please advise at Mobile [phone] (mobile)

## 2023-07-01 NOTE — Telephone Encounter (Signed)
Rx sent 

## 2023-07-02 ENCOUNTER — Other Ambulatory Visit: Payer: Self-pay | Admitting: Family Medicine

## 2023-08-01 ENCOUNTER — Ambulatory Visit: Payer: Medicare Other | Admitting: Orthopaedic Surgery

## 2023-08-01 ENCOUNTER — Other Ambulatory Visit: Payer: Self-pay | Admitting: Family Medicine

## 2023-08-01 DIAGNOSIS — I10 Essential (primary) hypertension: Secondary | ICD-10-CM

## 2023-08-03 ENCOUNTER — Telehealth: Payer: Self-pay | Admitting: Family Medicine

## 2023-08-03 ENCOUNTER — Ambulatory Visit (INDEPENDENT_AMBULATORY_CARE_PROVIDER_SITE_OTHER): Payer: Medicare Other | Admitting: Orthopaedic Surgery

## 2023-08-03 VITALS — Ht 68.0 in | Wt 299.0 lb

## 2023-08-03 DIAGNOSIS — M1711 Unilateral primary osteoarthritis, right knee: Secondary | ICD-10-CM

## 2023-08-03 DIAGNOSIS — G8929 Other chronic pain: Secondary | ICD-10-CM

## 2023-08-03 DIAGNOSIS — M25561 Pain in right knee: Secondary | ICD-10-CM | POA: Diagnosis not present

## 2023-08-03 NOTE — Progress Notes (Signed)
The patient is well-known to me.  She is now 72 years old and has well-documented debilitating end-stage arthritis of her right knee.  X-rays in 2023 showed complete loss of joint space with bone-on-bone wear.  She does ambulate using a cane.  She has had conservative treatment in the past.  Unfortunately her weight has been prohibitive for [place] proceeding with any type of surgical intervention for her right knee.  Her last BMI with [place] was 44.  Today her BMI is 45.46.  She says her knee is sore and is not terrible but it is at times detrimentally affecting her mobility, her quality of life and her actives daily living.  On exam today she lacks full extension by about 30 degrees and I can flex her to only maybe 60 to 70 degrees.  She understands surgery right now would be significantly difficult given the combination of her morbid obesity combined with the deformity of her knee.  I did offer a steroid injection today but she deferred this.  I cannot of agree with that as well given the fact that her arthritis is so severe that injection would only help her short-term.  Once again we did recommend weight loss.  We can certainly see her back in 6 months to see how she is doing overall and no get a repeat weight and BMI calculation.   The patient meets the AMA guidelines for Morbid (severe) obesity with a BMI > 40.0 and I have recommended weight loss.

## 2023-08-03 NOTE — Telephone Encounter (Addendum)
Pt called to say she needs a knee replacement, but was told she needs to lose weight. Pt is asking what does MD suggest? Pt was offered an OV, but states she was just here in August. Please advise.

## 2023-08-05 NOTE — Telephone Encounter (Signed)
Appointment with healthy weight and wellness clinic can be arranged. Thanks, BJ

## 2023-08-05 NOTE — Telephone Encounter (Signed)
I spoke with patient, referral has been placed & she is aware they will call her to schedule.

## 2023-08-22 NOTE — Progress Notes (Unsigned)
Cardiology Office Note:  .   Date:  08/24/2023  ID:  Sheila Hartman, DOB September 01, 1951, MRN [id number] PCP: Hartman, Sheila G, MD  [facility] Providers Cardiologist:  None { History of Present Illness: .   Sheila Hartman is a 72 y.o. female with history of HTN, HLD, MGUS who presents for the evaluation of LVH at the request of Hartman, Sheila G, MD.  History of Present Illness   Sheila Hartman, a 72 year old female with a history of hypertension, hyperlipidemia, and MGUS, presents for evaluation of left ventricular hypertrophy (LVH). She had an echocardiogram earlier this year which showed mild LVH and no significant abnormalities. She had COVID-19 in August and continued to experience a cough and sore throat. A chest x-ray was done to rule out pneumonia, which showed an enlarged cardiac silhouette. She denies any history of heart attack, stroke, or diabetes. She is currently trying to lose weight for knee surgery due to worsening knee pain. Both her parents had diabetes and her father likely had a heart attack. She denies any personal history of smoking or alcohol use. 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 She is single and has one child, who is actually her niece, but she has had custody of her since she was [age]. No symptoms of SOB or CP.          Problem List HTN HLD MGUS Mild LVH    ROS: All other ROS reviewed and negative. Pertinent positives noted in the HPI.     Studies Reviewed: Marland Kitchen   EKG Interpretation Date/Time:  Wednesday August 24 2023 10:54:33 EST Ventricular Rate:  100 PR Interval:  136 QRS Duration:  78 QT Interval:  362 QTC Calculation: 466 R Axis:   34  Text Interpretation: Normal sinus rhythm Normal ECG Confirmed by Lennie Odor [phone]) on 08/24/2023 10:57:18 AM    TTE 06/09/2023  1. Mild intracavitary gradient. Peak velocity 0.71 m/s. Peak gradient 2  mmHg. Left ventricular ejection fraction, by estimation,  is 70 to 75%. The  left ventricle has hyperdynamic function. The left ventricle has no  regional wall motion abnormalities.  There is mild concentric left ventricular hypertrophy. Left ventricular  diastolic parameters are consistent with Grade I diastolic dysfunction  (impaired relaxation). Elevated left ventricular end-diastolic pressure.   2. Right ventricular systolic function is normal. The right ventricular  size is normal.   3. The mitral valve is normal in structure. No evidence of mitral valve  regurgitation. No evidence of mitral stenosis.   4. The aortic valve is normal in structure. Aortic valve regurgitation is  not visualized. No aortic stenosis is present.   5. The inferior vena cava is normal in size with greater than 50%  respiratory variability, suggesting right atrial pressure of 3 mmHg.  Physical Exam:   VS:  BP 130/86 (BP Location: Left Arm, Patient Position: Sitting, Cuff Size: Normal)   Ht 5\' 8"  (1.727 m)   Wt 296 lb (134.3 kg)   SpO2 99%   BMI 45.01 kg/m    Wt Readings from Last 3 Encounters:  08/24/23 296 lb (134.3 kg)  08/03/23 299 lb (135.6 kg)  05/18/23 293 lb 2 oz (133 kg)    GEN: Well nourished, well developed in no acute distress NECK: No JVD; No carotid bruits CARDIAC: RRR, no murmurs, rubs, gallops RESPIRATORY:  Clear to auscultation without rales, wheezing or rhonchi  ABDOMEN: Soft, non-tender, non-distended EXTREMITIES:  No edema;  No deformity  ASSESSMENT AND PLAN: .   Assessment and Plan    Left Ventricular Hypertrophy (LVH) Mild LVH noted on echocardiogram, likely secondary to hypertension. No significant abnormalities. EKG normal. No symptoms of heart disease. -Continue current management. No changes to medications.  Hypertension Well controlled on current regimen. -Continue current antihypertensive medications.  Hyperlipidemia Well controlled. -Continue current lipid-lowering therapy.  MGUS Stable, no evidence of heart  involvement. Followed by primary care physician. -Continue current monitoring and management with primary care physician.  Follow-up As needed.              Follow-up: Return if symptoms worsen or fail to improve.  Signed, Lenna Gilford. Flora Lipps, MD, [facility]  [facility]  [address] [place], [place] [address] [phone]  11:47 AM

## 2023-08-24 ENCOUNTER — Ambulatory Visit: Payer: Medicare Other | Attending: Cardiovascular Disease | Admitting: Cardiovascular Disease

## 2023-08-24 ENCOUNTER — Encounter: Payer: Self-pay | Admitting: Cardiovascular Disease

## 2023-08-24 VITALS — BP 130/86 | Ht 68.0 in | Wt 296.0 lb

## 2023-08-24 DIAGNOSIS — I517 Cardiomegaly: Secondary | ICD-10-CM | POA: Diagnosis not present

## 2023-08-24 NOTE — Patient Instructions (Signed)
Medication Instructions:   NONE   *If you need a refill on your cardiac medications before your next appointment, please call your pharmacy*   Lab Work: NONE    If you have labs (blood work) drawn today and your tests are completely normal, you will receive your results only by: MyChart Message (if you have MyChart) OR A paper copy in the mail If you have any lab test that is abnormal or we need to change your treatment, we will call you to review the results.   Testing/Procedures: NONE    Follow-Up: At [facility], you and your health needs are our priority.  As part of our continuing mission to provide you with exceptional heart care, we have created designated Provider Care Teams.  These Care Teams include your primary Cardiologist (physician) and Advanced Practice Providers (APPs -  Physician Assistants and Nurse Practitioners) who all work together to provide you with the care you need, when you need it.  We recommend signing up for the patient portal called "MyChart".  Sign up information is provided on this After Visit Summary.  MyChart is used to connect with patients for Virtual Visits (Telemedicine).  Patients are able to view lab/test results, encounter notes, upcoming appointments, etc.  Non-urgent messages can be sent to your provider as well.   To learn more about what you can do with MyChart, go to [email or web address].    Your next appointment:   Follow up as needed    Provider:   Lennie Odor, MD   Other Instructions

## 2023-09-12 ENCOUNTER — Telehealth (INDEPENDENT_AMBULATORY_CARE_PROVIDER_SITE_OTHER): Payer: Medicare Other | Admitting: Family Medicine

## 2023-09-12 ENCOUNTER — Encounter: Payer: Self-pay | Admitting: Family Medicine

## 2023-09-12 VITALS — Ht 68.0 in

## 2023-09-12 DIAGNOSIS — M545 Low back pain, unspecified: Secondary | ICD-10-CM

## 2023-09-12 MED ORDER — TIZANIDINE HCL 4 MG PO TABS
2.0000 mg | ORAL_TABLET | Freq: Every day | ORAL | 0 refills | Status: AC
Start: 2023-09-12 — End: 2023-09-26
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
## 2023-10-05 ENCOUNTER — Ambulatory Visit: Payer: Medicare Other | Admitting: Podiatry

## 2023-10-05 ENCOUNTER — Encounter: Payer: Self-pay | Admitting: Podiatry

## 2023-10-05 DIAGNOSIS — M79674 Pain in right toe(s): Secondary | ICD-10-CM | POA: Diagnosis not present

## 2023-10-05 DIAGNOSIS — M79675 Pain in left toe(s): Secondary | ICD-10-CM | POA: Diagnosis not present

## 2023-10-05 DIAGNOSIS — B351 Tinea unguium: Secondary | ICD-10-CM

## 2023-10-05 NOTE — Progress Notes (Signed)
Subjective: Sheila Hartman is a 73 y.o. female patient seen today for follow up of  painful thick toenails that are difficult to trim. Pain interferes with ambulation. Aggravating factors include wearing enclosed shoe gear. Pain is relieved with periodic professional debridement.  New problems reported today: None.  PCP is [place], Betty G, MD. Last visit was: 04/03/2021.  Allergies  Allergen Reactions   Penicillins Hives    Objective: Physical Exam  General: Patient is a pleasant 73 y.o. African American female morbidly obese in NAD. AAO x 3.   Neurovascular Examination: CFT immediate b/l LE. Palpable DP/PT pulses b/l LE. Digital hair sparse b/l. Skin temperature gradient WNL b/l. No pain with calf compression b/l. No edema noted b/l. No cyanosis or clubbing noted b/l LE.  Protective sensation intact 5/5 intact bilaterally with 10g monofilament b/l. Vibratory sensation intact b/l.  Dermatological:  Pedal integument with normal turgor, texture and tone b/l LE. No open wounds b/l. No interdigital macerations b/l. Toenails 1-5 b/l elongated, thickened, discolored with subungual debris. +Tenderness with dorsal palpation of nailplates. No hyperkeratotic or porokeratotic lesions present.  Musculoskeletal:  Normal muscle strength 5/5 to all lower extremity muscle groups bilaterally. HAV with bunion deformity noted b/l LE. Hammertoe(s) noted to the R 2nd toe.Marland Kitchen No pain, crepitus or joint limitation noted with ROM b/l LE.  Patient ambulates independently without assistive aids.  Assessment: 1. Pain due to onychomycosis of toenails of both feet     Plan: Patient was evaluated and treated and all questions answered. Consent given for treatment as described below: -Patient to continue soft, supportive shoe gear daily. -Mycotic toenails 1-5 bilaterally were debrided in length and girth with sterile nail nippers and dremel without incident. -Patient/POA to call should there be  question/concern in the interim.  No follow-ups on file.  Louann Sjogren, DPM

## 2023-10-06 DIAGNOSIS — H52203 Unspecified astigmatism, bilateral: Secondary | ICD-10-CM | POA: Diagnosis not present

## 2023-10-06 DIAGNOSIS — H5213 Myopia, bilateral: Secondary | ICD-10-CM | POA: Diagnosis not present

## 2023-10-06 DIAGNOSIS — E70319 Ocular albinism, unspecified: Secondary | ICD-10-CM | POA: Diagnosis not present

## 2023-10-06 DIAGNOSIS — H53003 Unspecified amblyopia, bilateral: Secondary | ICD-10-CM | POA: Diagnosis not present

## 2023-10-07 ENCOUNTER — Telehealth: Payer: Self-pay

## 2023-10-07 MED ORDER — FERROUS SULFATE 325 (65 FE) MG PO TABS: ORAL_TABLET | ORAL | 2 refills | Status: DC

## 2023-10-07 NOTE — Telephone Encounter (Signed)
Copied from CRM [id number]. Topic: Clinical - Medication Refill >> Oct 07, 2023 10:40 AM Aletta Edouard wrote: Most Recent Primary Care Visit:  Provider: [place], BETTY G  Department: [facility]  Visit Type: OFFICE VISIT  Date: 09/12/2023  Medication: ferrous sulfate 325 (65 FE) MG tablet   Has the patient contacted their pharmacy? Yes (Agent: If no, request that the patient contact the pharmacy for the refill. If patient does not wish to contact the pharmacy document the reason why and proceed with request.) (Agent: If yes, when and what did the pharmacy advise?)  Is this the correct pharmacy for this prescription? Yes If no, delete pharmacy and type the correct one.  This is the patient's preferred pharmacy:  [id number] - [place], [place] - [address] AT [facility] [address] [place] [address] Phone: [phone] Fax: [phone]  7   Has the prescription been filled recently? No  Is the patient out of the medication? Yes  Has the patient been seen for an appointment in the last year OR does the patient have an upcoming appointment? Yes  Can we respond through MyChart? No  Agent: Please be advised that Rx refills may take up to 3 business days. We ask that you follow-up with your pharmacy.

## 2023-11-10 ENCOUNTER — Inpatient Hospital Stay: Payer: Medicare Other | Admitting: Hematology and Oncology

## 2023-11-11 ENCOUNTER — Telehealth: Payer: Self-pay | Admitting: Hematology and Oncology

## 2023-11-11 NOTE — Telephone Encounter (Signed)
Patient is aware of rescheduled appointment times/dates 

## 2023-11-18 ENCOUNTER — Telehealth: Payer: Self-pay

## 2023-11-18 ENCOUNTER — Ambulatory Visit: Payer: Medicare Other | Admitting: Family Medicine

## 2023-11-18 ENCOUNTER — Encounter: Payer: Self-pay | Admitting: Family Medicine

## 2023-11-18 VITALS — BP 130/80 | HR 96 | Resp 16 | Ht 68.0 in | Wt 290.0 lb

## 2023-11-18 DIAGNOSIS — R7303 Prediabetes: Secondary | ICD-10-CM

## 2023-11-18 DIAGNOSIS — D472 Monoclonal gammopathy: Secondary | ICD-10-CM | POA: Diagnosis not present

## 2023-11-18 DIAGNOSIS — F419 Anxiety disorder, unspecified: Secondary | ICD-10-CM

## 2023-11-18 DIAGNOSIS — I10 Essential (primary) hypertension: Secondary | ICD-10-CM | POA: Diagnosis not present

## 2023-11-18 DIAGNOSIS — E559 Vitamin D deficiency, unspecified: Secondary | ICD-10-CM | POA: Diagnosis not present

## 2023-11-18 LAB — BASIC METABOLIC PANEL
BUN: 13 mg/dL (ref 6–23)
CO2: 27 meq/L (ref 19–32)
Calcium: 10.2 mg/dL (ref 8.4–10.5)
Chloride: 106 meq/L (ref 96–112)
Creatinine, Ser: 1 mg/dL (ref 0.40–1.20)
GFR: 57.09 mL/min — ABNORMAL LOW (ref >60.00)
Glucose, Bld: 109 mg/dL — ABNORMAL HIGH (ref 70–99)
Potassium: 3.9 meq/L (ref 3.5–5.1)
Sodium: 142 meq/L (ref 135–145)

## 2023-11-18 LAB — VITAMIN D 25 HYDROXY (VIT D DEFICIENCY, FRACTURES): VITD: 37.89 ng/mL (ref 30.00–100.00)

## 2023-11-18 LAB — HEMOGLOBIN A1C: Hgb A1c MFr Bld: 5.8 % (ref 4.6–6.5)

## 2023-11-18 NOTE — Assessment & Plan Note (Addendum)
 Last about 6 Lb since 08/2023. Patient understands the benefits of wt loss as well as adverse effects of obesity. Consistency with healthy diet and physical activity encouraged. She is to call to [facility] to arrange appointment.

## 2023-11-18 NOTE — Telephone Encounter (Signed)
 Left message for patient concerning visit on 11/21/23

## 2023-11-18 NOTE — Patient Instructions (Addendum)
 A few things to remember from today's visit:  Hypertension, essential, benign - Plan: Basic metabolic panel, VITAMIN D 25 Hydroxy (Vit-D Deficiency, Fractures)  Vitamin D deficiency, unspecified  Prediabetes - Plan: Hemoglobin A1c  No changes today. Continue working on a healthful diet and arrange appt with the healthy wt and wellness clinic.  If you need refills for medications you take chronically, please call your pharmacy. Do not use My Chart to request refills or for acute issues that need immediate attention. If you send a my chart message, it may take a few days to be addressed, specially if I am not in the office.  Please be sure medication list is accurate. If a new problem present, please set up appointment sooner than planned today.

## 2023-11-18 NOTE — Assessment & Plan Note (Addendum)
 Labs done in 03/2023, she is no longer following with hematology for this problem. She last saw Dr Candise Che in 10/2020. Asymptomatic. Will continue annual follow-up, before if needed. She wants me to send a message to Dr Candise Che to let him know labs were done in 04/2023.

## 2023-11-18 NOTE — Assessment & Plan Note (Signed)
 BP adequately controlled. Continue amlodipine-benazepril 5-20 mg daily as well as low-salt diet Recommend monitoring BP at home. F/U in 6 months. Eye exam is current.
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
## 2023-11-18 NOTE — Assessment & Plan Note (Signed)
 Encouraged consistency with a healthy lifestyle for diabetes prevention. Last hemoglobin A1c was 5.9 in 03/2023. Further recommendation will be given according to hemoglobin A1c result.

## 2023-11-19 ENCOUNTER — Encounter: Payer: Self-pay | Admitting: Family Medicine

## 2023-11-19 MED ORDER — DULOXETINE HCL 30 MG PO CPEP: 30.0000 mg | ORAL_CAPSULE | Freq: Every day | ORAL | 3 refills | Status: DC

## 2023-11-19 MED ORDER — DULOXETINE HCL 30 MG PO CPEP: 30.0000 mg | ORAL_CAPSULE | Freq: Every day | ORAL | 3 refills | Status: AC

## 2023-11-19 NOTE — Assessment & Plan Note (Signed)
 Continue Vit D 800 U daily. Further recommendations according to 25 OH vit D result.

## 2023-11-19 NOTE — Assessment & Plan Note (Signed)
 Well controlled. She would like to continue Duloxetine 30 mg daily.

## 2023-11-21 ENCOUNTER — Inpatient Hospital Stay: Payer: Medicare Other | Admitting: Hematology and Oncology

## 2023-11-22 ENCOUNTER — Encounter (INDEPENDENT_AMBULATORY_CARE_PROVIDER_SITE_OTHER): Payer: Self-pay

## 2023-12-05 ENCOUNTER — Telehealth: Payer: Self-pay

## 2023-12-05 NOTE — Telephone Encounter (Signed)
 Spoke with patient and confirmed appointment on 12/06/23

## 2023-12-06 ENCOUNTER — Inpatient Hospital Stay: Attending: Hematology and Oncology | Admitting: Hematology and Oncology

## 2023-12-06 VITALS — BP 141/72 | HR 98 | Temp 97.2°F | Resp 16 | Wt 292.9 lb

## 2023-12-06 DIAGNOSIS — Z923 Personal history of irradiation: Secondary | ICD-10-CM | POA: Insufficient documentation

## 2023-12-06 DIAGNOSIS — D0512 Intraductal carcinoma in situ of left breast: Secondary | ICD-10-CM | POA: Diagnosis not present

## 2023-12-06 DIAGNOSIS — Z87891 Personal history of nicotine dependence: Secondary | ICD-10-CM | POA: Insufficient documentation

## 2023-12-06 DIAGNOSIS — Z79811 Long term (current) use of aromatase inhibitors: Secondary | ICD-10-CM | POA: Diagnosis not present

## 2023-12-06 NOTE — Assessment & Plan Note (Addendum)
 This is a very pleasant 73 year old postmenopausal female patient with newly diagnosed left breast DCIS, ER/PR positive referred to medical oncology for recommendations.  She underwent left breast lumpectomy, final pathology showed grade 2 DCIS and an intraductal papilloma, negative margins.  She then completed radiation and is now on anastrozole. Baseline bone density is normal.  Breast cancer On anastrozole therapy with no side effects. No recurrence evidence. Last bone density normal. Mammogram overdue. - Continue anastrozole therapy. - Ensure follow-up with breast surgeon as needed. - Schedule overdue mammogram.  Knee osteoarthritis Engaged in weight loss for potential knee surgery. - Continue weight loss efforts.  Follow-up Advised follow-up in one year unless seen by breast surgeon. - Schedule follow-up appointment in one year.
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
## 2023-12-28 ENCOUNTER — Ambulatory Visit
Admission: RE | Admit: 2023-12-28 | Discharge: 2023-12-28 | Disposition: A | Discharge status: Home / Self Care | Source: Ambulatory Visit | Attending: Adult Health | Admitting: Adult Health

## 2023-12-28 ENCOUNTER — Ambulatory Visit: Admitting: Podiatry

## 2023-12-28 ENCOUNTER — Ambulatory Visit (INDEPENDENT_AMBULATORY_CARE_PROVIDER_SITE_OTHER)

## 2023-12-28 DIAGNOSIS — M21611 Bunion of right foot: Secondary | ICD-10-CM | POA: Diagnosis not present

## 2023-12-28 DIAGNOSIS — Z853 Personal history of malignant neoplasm of breast: Secondary | ICD-10-CM | POA: Diagnosis not present

## 2023-12-28 DIAGNOSIS — M65971 Unspecified synovitis and tenosynovitis, right ankle and foot: Secondary | ICD-10-CM

## 2023-12-28 DIAGNOSIS — D0512 Intraductal carcinoma in situ of left breast: Secondary | ICD-10-CM

## 2023-12-28 DIAGNOSIS — M21619 Bunion of unspecified foot: Secondary | ICD-10-CM

## 2023-12-28 DIAGNOSIS — Z08 Encounter for follow-up examination after completed treatment for malignant neoplasm: Secondary | ICD-10-CM | POA: Diagnosis not present

## 2023-12-28 DIAGNOSIS — M7751 Other enthesopathy of right foot: Secondary | ICD-10-CM

## 2023-12-28 MED ORDER — TRIAMCINOLONE ACETONIDE 10 MG/ML IJ SUSP
2.5000 mg | Freq: Once | INTRAMUSCULAR | Status: AC
  Administered 2023-12-28: 2.5 mg via INTRA_ARTICULAR

## 2023-12-28 MED ORDER — DEXAMETHASONE SODIUM PHOSPHATE 120 MG/30ML IJ SOLN
4.0000 mg | Freq: Once | INTRAMUSCULAR | Status: AC
  Administered 2023-12-28: 4 mg via INTRA_ARTICULAR

## 2023-12-28 NOTE — Progress Notes (Signed)
 Subjective: Sheila Hartman is a 73 y.o. female patient seen today for new problem with right great toe bunion. States a couple weeks agop the toe started getting red swollen and very sore to the touch has improved some but is still very sore. Does have a history of gout and a family history. Denies any current treatments. Denies any other pedal complaints.   PCP is [place], Betty G, MD. Last visit was: 09/12/23  Allergies  Allergen Reactions   Penicillins Hives    Objective: Vascular: DP/PT pulses 2/4 bilateral. CFT <3 seconds. Absent hair growth on digits. Edema noted to bilateral lower extremities. Xerosis noted bilaterally.  Skin. No lacerations or abrasions bilateral feet. Nails 1-5 bilateral  are thickened discolored and elongated with subungual debris.  Musculoskeletal: MMT 5/5 bilateral lower extremities in DF, PF, Inversion and Eversion. Deceased ROM in DF of ankle joint. HAV deformity and hammered second digit on right. Tender to medial eminence on right. Pain with ROM of the joint as well. Sensitive to touch with mild erythema and edema noted.  Neurological: Sensation intact to light touch. Protective sensation diminished bilateral.    Assessment: 1. Bunion, right   2. Capsulitis of metatarsophalangeal (MTP) joint of right foot     Plan: Patient was evaluated and treated and all questions answ ered. -Xrays reviewed. 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  -Discussed HAV and treatment options;conservative and surgical management; risks, benefits, alternatives discussed. All patient's questions answered. -Discussed padding and wide shoe gear.   -Discussed treatement options for gouty arthritis and gout education provided. -Patient opted for injection. After oral consent, injected right first MPJ with 1cc lidocaine and marcaine plain mixed with 1 cc Dexmethasone phosphate without complication; post  injection care explained. -Discussed diet and modifications.  -Recommend continue with good supportive shoes and inserts.  -Discussed surgical options.  Return next week for recheck and rfc.   No follow-ups on file.  Louann Sjogren, DPM

## 2024-01-04 ENCOUNTER — Ambulatory Visit (INDEPENDENT_AMBULATORY_CARE_PROVIDER_SITE_OTHER): Payer: Medicare Other | Admitting: Podiatry

## 2024-01-04 DIAGNOSIS — Z91199 Patient's noncompliance with other medical treatment and regimen due to unspecified reason: Secondary | ICD-10-CM

## 2024-01-04 NOTE — Progress Notes (Signed)
 No show

## 2024-01-23 ENCOUNTER — Ambulatory Visit (INDEPENDENT_AMBULATORY_CARE_PROVIDER_SITE_OTHER): Admitting: Podiatry

## 2024-01-23 ENCOUNTER — Encounter: Payer: Self-pay | Admitting: Podiatry

## 2024-01-23 DIAGNOSIS — M79675 Pain in left toe(s): Secondary | ICD-10-CM

## 2024-01-23 DIAGNOSIS — M21611 Bunion of right foot: Secondary | ICD-10-CM | POA: Diagnosis not present

## 2024-01-23 DIAGNOSIS — M79674 Pain in right toe(s): Secondary | ICD-10-CM

## 2024-01-23 DIAGNOSIS — M65971 Unspecified synovitis and tenosynovitis, right ankle and foot: Secondary | ICD-10-CM

## 2024-01-23 DIAGNOSIS — B351 Tinea unguium: Secondary | ICD-10-CM | POA: Diagnosis not present

## 2024-01-23 MED ORDER — METHYLPREDNISOLONE 4 MG PO TBPK: ORAL_TABLET | ORAL | 0 refills | Status: AC

## 2024-01-23 NOTE — Progress Notes (Signed)
 Subjective: Sheila Hartman is a 73 y.o. female patient seen today for follow up of  painful thick toenails that are difficult to trim. Pain interferes with ambulation. Aggravating factors include wearing enclosed shoe gear. Pain is relieved with periodic professional debridement.  New problems reported today: Does relate the great toe does seem flared up again. She has been dealing with HAV and gout and relates the toe was acting up this past week   PCP is [place], Betty G, MD. Last visit was: 04/03/2021.  Allergies  Allergen Reactions   Penicillins Hives    Objective: Physical Exam  General: Patient is a pleasant 73 y.o. African American female morbidly obese in NAD. AAO x 3.   Neurovascular Examination: CFT immediate b/l LE. Palpable DP/PT pulses b/l LE. Digital hair sparse b/l. Skin temperature gradient WNL b/l. No pain with calf compression b/l. No edema noted b/l. No cyanosis or clubbing noted b/l LE.  Protective sensation intact 5/5 intact bilaterally with 10g monofilament b/l. Vibratory sensation intact b/l.  Dermatological:  Pedal integument with normal turgor, texture and tone b/l LE. No open wounds b/l. No interdigital macerations b/l. Toenails 1-5 b/l elongated, thickened, discolored with subungual debris. +Tenderness with dorsal palpation of nailplates. No hyperkeratotic or porokeratotic lesions present.  Musculoskeletal:  Normal muscle strength 5/5 to all lower extremity muscle groups bilaterally. HAV with bunion deformity noted b/l LE. Hammertoe(s) noted to the R 2nd toe.Aaron Aas No pain, crepitus or joint limitation noted with ROM b/l LE.  Patient ambulates independently without assistive aids.  Assessment: 1. Pain due to onychomycosis of toenails of both feet   2. Synovitis of right foot   3. Bunion, right      Plan: Patient was evaluated and treated and all questions answered. Consent given for treatment as described below: -Patient to continue soft, supportive  shoe gear daily. -Mycotic toenails 1-5 bilaterally were debrided in length and girth with sterile nail nippers and dremel without incident. -Patient/POA to call should there be question/concern in the interim.  Discussed HAV and gout and treatment options;conservative and surgical management; risks, benefits, alternatives discussed. All patient's questions answered. -Discussed padding and wide shoe gear.   -Medrol  dose pack provided for flares.  -Discussed diet and modifications.  -Recommend continue with good supportive shoes and inserts.  -Discussed surgical options.   Return 3 months for rfc.   Return in about 3 months (around 04/24/2024) for rfc.  Jennefer Moats, DPM

## 2024-01-30 ENCOUNTER — Encounter (INDEPENDENT_AMBULATORY_CARE_PROVIDER_SITE_OTHER): Payer: Self-pay | Admitting: Physician Assistant

## 2024-01-30 ENCOUNTER — Ambulatory Visit (INDEPENDENT_AMBULATORY_CARE_PROVIDER_SITE_OTHER): Admitting: Physician Assistant

## 2024-01-30 VITALS — BP 148/87 | HR 79 | Temp 98.1°F | Ht 66.0 in | Wt 286.0 lb

## 2024-01-30 DIAGNOSIS — E559 Vitamin D deficiency, unspecified: Secondary | ICD-10-CM | POA: Diagnosis not present

## 2024-01-30 DIAGNOSIS — I10 Essential (primary) hypertension: Secondary | ICD-10-CM | POA: Diagnosis not present

## 2024-01-30 DIAGNOSIS — E538 Deficiency of other specified B group vitamins: Secondary | ICD-10-CM

## 2024-01-30 DIAGNOSIS — D472 Monoclonal gammopathy: Secondary | ICD-10-CM | POA: Diagnosis not present

## 2024-01-30 DIAGNOSIS — Z6841 Body Mass Index (BMI) 40.0 and over, adult: Secondary | ICD-10-CM

## 2024-01-30 DIAGNOSIS — M170 Bilateral primary osteoarthritis of knee: Secondary | ICD-10-CM

## 2024-01-30 DIAGNOSIS — I517 Cardiomegaly: Secondary | ICD-10-CM

## 2024-01-30 DIAGNOSIS — R7303 Prediabetes: Secondary | ICD-10-CM

## 2024-01-30 DIAGNOSIS — M159 Polyosteoarthritis, unspecified: Secondary | ICD-10-CM

## 2024-01-30 DIAGNOSIS — F419 Anxiety disorder, unspecified: Secondary | ICD-10-CM

## 2024-01-30 DIAGNOSIS — E785 Hyperlipidemia, unspecified: Secondary | ICD-10-CM

## 2024-01-30 DIAGNOSIS — E66813 Obesity, class 3: Secondary | ICD-10-CM
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
## 2024-02-01 ENCOUNTER — Ambulatory Visit: Payer: Medicare Other | Admitting: Orthopaedic Surgery

## 2024-02-16 ENCOUNTER — Telehealth: Payer: Self-pay | Admitting: *Deleted

## 2024-02-16 DIAGNOSIS — M170 Bilateral primary osteoarthritis of knee: Secondary | ICD-10-CM

## 2024-02-16 NOTE — Telephone Encounter (Signed)
 Copied from CRM [id number]. Topic: Referral - Request for Referral >> Feb 16, 2024  9:08 AM Sheila Hartman wrote: Did the patient discuss referral with their provider in the last year? Yes   Appointment offered? No  Type of order/referral and detailed reason for visit: Pt states that she needs a referral for a reclining lift chair due to her bad knees. States they will do it for her once she loses some weight but needs the referral to do so.  Preference of office, provider, location: [facility] Fax number-[phone]  If referral order, have you been seen by this specialty before? No (If Yes, this issue or another issue? When? Where?  Can we respond through MyChart? Yes, or you can call [phone] or [phone]

## 2024-02-17 ENCOUNTER — Other Ambulatory Visit: Payer: Self-pay | Admitting: Family Medicine

## 2024-02-21 NOTE — Telephone Encounter (Signed)
Referral placed/faxed.

## 2024-02-21 NOTE — Telephone Encounter (Signed)
 Patient is calling back in with the correct fax number  [phone]

## 2024-02-21 NOTE — Telephone Encounter (Signed)
 This is the same fax number order was originally sent too.

## 2024-03-02 ENCOUNTER — Telehealth: Payer: Self-pay | Admitting: Family Medicine

## 2024-03-02 NOTE — Telephone Encounter (Signed)
 Copied from CRM [id number]. Topic: Referral - Request for Referral >> Feb 16, 2024  9:08 AM Dewanda Foots wrote: Did the patient discuss referral with their provider in the last year? Yes   Appointment offered? No  Type of order/referral and detailed reason for visit: Pt states that she needs a referral for a reclining lift chair due to her bad knees. States they will do it for her once she loses some weight but needs the referral to do so.  Preference of office, provider, location: [facility] Fax [phone]  If referral order, have you been seen by this specialty before? No (If Yes, this issue or another issue? When? Where?  Can we respond through MyChart? Yes, or you can call [phone] or [phone] >> Mar 02, 2024  4:21 PM Magdalene [employer] wrote: Patient is calling to check status of referral because she has not heard back since the request. Please contact patient with an update.

## 2024-03-09 NOTE — Progress Notes (Signed)
 Patient is requesting a reclining lift chair due to the bad pain in her knees. She does have a medical history of osteoarthritis in her knees. The lift chair will help to prevent falls as well.

## 2024-04-12 ENCOUNTER — Other Ambulatory Visit: Payer: Self-pay | Admitting: Family Medicine

## 2024-04-12 DIAGNOSIS — I10 Essential (primary) hypertension: Secondary | ICD-10-CM

## 2024-04-18 ENCOUNTER — Other Ambulatory Visit: Payer: Self-pay | Admitting: Gastroenterology

## 2024-04-23 ENCOUNTER — Ambulatory Visit: Payer: Self-pay

## 2024-04-23 NOTE — Telephone Encounter (Signed)
 FYI Only or Action Required?: FYI only for provider.  Patient was last seen in primary care on 11/18/2023 by [place], Betty G, MD.  Called Nurse Triage reporting No chief complaint on file..  Symptoms began a week ago.  Interventions attempted: Rest, hydration, or home remedies.  Symptoms are: gradually worsening.  Triage Disposition: See Physician Within 24 Hours  Patient/caregiver understands and will follow disposition?: Yes  Copied from CRM #[id number]. Topic: Clinical - Red Word Triage >> Apr 23, 2024  3:19 PM Sheila Hartman wrote: Red Word that prompted transfer to Nurse Triage: Patient has noticed blood in her urine, light spotting, frequent urinating/feels like she has to go then gets to the toilet and doesn't have to. Not feeling burning but a little bit of pain while going. Reason for Disposition  [1] Pink or red-colored urine and likely from food (beets, rhubarb, red food dye) AND [2] lasts > 24 hours after stopping food  Answer Assessment - Initial Assessment Questions 1. COLOR of URINE: Describe the color of the urine.  (e.g., tea-colored, pink, red, bloody) Do you have blood clots in your urine? (e.g., none, pea, grape, small coin)     Pinkish Colored Urine  2. ONSET: When did the bleeding start?      A week ago  3. EPISODES: How many times has there been blood in the urine? or How many times today?     2 Today, Uncertain  4. PAIN with URINATION: Is there any pain with passing your urine? If Yes, ask: How bad is the pain?  (Scale 1-10; or mild, moderate, severe)     Mild  5. FEVER: Do you have a fever? If Yes, ask: What is your temperature, how was it measured, and when did it start?     No  6. ASSOCIATED SYMPTOMS: Are you passing urine more frequently than usual?     Frequency  7. OTHER SYMPTOMS: Do you have any other symptoms? (e.g., back/flank pain, abdomen pain, vomiting)     Denies  8. PREGNANCY: Is there any chance you are pregnant? When  was your last menstrual period?     No and No  Protocols used: Urine - Blood In-A-AH

## 2024-04-24 ENCOUNTER — Ambulatory Visit: Payer: Self-pay | Admitting: Family Medicine

## 2024-04-24 ENCOUNTER — Ambulatory Visit (INDEPENDENT_AMBULATORY_CARE_PROVIDER_SITE_OTHER): Admitting: Family Medicine

## 2024-04-24 ENCOUNTER — Ambulatory Visit: Admitting: Podiatry

## 2024-04-24 ENCOUNTER — Encounter: Payer: Self-pay | Admitting: Family Medicine

## 2024-04-24 VITALS — BP 138/70 | HR 92 | Temp 98.4°F | Resp 16 | Ht 66.0 in | Wt 293.0 lb

## 2024-04-24 DIAGNOSIS — R319 Hematuria, unspecified: Secondary | ICD-10-CM

## 2024-04-24 DIAGNOSIS — R310 Gross hematuria: Secondary | ICD-10-CM

## 2024-04-24 DIAGNOSIS — N390 Urinary tract infection, site not specified: Secondary | ICD-10-CM

## 2024-04-24 DIAGNOSIS — I10 Essential (primary) hypertension: Secondary | ICD-10-CM | POA: Diagnosis not present

## 2024-04-24 DIAGNOSIS — Z87442 Personal history of urinary calculi: Secondary | ICD-10-CM | POA: Diagnosis not present

## 2024-04-24 LAB — URINALYSIS, ROUTINE W REFLEX MICROSCOPIC
Bilirubin Urine: NEGATIVE
Ketones, ur: NEGATIVE
Nitrite: NEGATIVE
Specific Gravity, Urine: 1.02 (ref 1.000–1.030)
Total Protein, Urine: 30 — AB
Urine Glucose: NEGATIVE
Urobilinogen, UA: 0.2 (ref 0.0–1.0)
pH: 6 (ref 5.0–8.0)

## 2024-04-24 LAB — BASIC METABOLIC PANEL WITH GFR
BUN: 17 mg/dL (ref 6–23)
CO2: 27 meq/L (ref 19–32)
Calcium: 9.8 mg/dL (ref 8.4–10.5)
Chloride: 107 meq/L (ref 96–112)
Creatinine, Ser: 0.88 mg/dL (ref 0.40–1.20)
GFR: 66.35 mL/min (ref >60.00)
Glucose, Bld: 110 mg/dL — ABNORMAL HIGH (ref 70–99)
Potassium: 3.5 meq/L (ref 3.5–5.1)
Sodium: 142 meq/L (ref 135–145)

## 2024-04-24 LAB — CBC
HCT: 37.9 % (ref 36.0–46.0)
Hemoglobin: 12 g/dL (ref 12.0–15.0)
MCHC: 31.5 g/dL (ref 30.0–36.0)
MCV: 78.4 fl (ref 78.0–100.0)
Platelets: 249 K/uL (ref 150.0–400.0)
RBC: 4.84 Mil/uL (ref 3.87–5.11)
RDW: 16.2 % — ABNORMAL HIGH (ref 11.5–15.5)
WBC: 7.5 K/uL (ref 4.0–10.5)

## 2024-04-24 MED ORDER — SULFAMETHOXAZOLE-TRIMETHOPRIM 800-160 MG PO TABS
1.0000 | ORAL_TABLET | Freq: Two times a day (BID) | ORAL | 0 refills | Status: AC
Start: 2024-04-24 — End: 2024-04-29

## 2024-04-24 NOTE — Patient Instructions (Addendum)
 A few things to remember from today's visit:  Gross hematuria - Plan: Urinalysis, Routine w reflex microscopic, Urine Culture, Basic metabolic panel with GFR, CBC, CT ABDOMEN PELVIS W WO CONTRAST, sulfamethoxazole -trimethoprim  (BACTRIM  DS) 800-160 MG tablet  Hypertension, essential, benign - Plan: Basic metabolic panel with GFR, CBC  History of nephrolithiasis - Plan: CT ABDOMEN PELVIS W WO CONTRAST  Urinary tract infection with hematuria, site unspecified  If you need refills for medications you take chronically, please call your pharmacy. Do not use My Chart to request refills or for acute issues that need immediate attention. If you send a my chart message, it may take a few days to be addressed, specially if I am not in the office.  Please be sure medication list is accurate. If a new problem present, please set up appointment sooner than planned today.
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
## 2024-04-24 NOTE — Assessment & Plan Note (Signed)
 Otherwise BP adequately controlled. Continue amlodipine -benazepril  5-20 mg daily as well as low-salt diet Recommend monitoring BP at home.

## 2024-04-27 LAB — URINE CULTURE
MICRO NUMBER:: 16788903
SPECIMEN QUALITY:: ADEQUATE

## 2024-04-30 ENCOUNTER — Other Ambulatory Visit: Payer: Self-pay | Admitting: *Deleted

## 2024-04-30 MED ORDER — ANASTROZOLE 1 MG PO TABS
1.0000 mg | ORAL_TABLET | Freq: Every day | ORAL | 3 refills | Status: AC
Start: 2024-04-30 — End: ?

## 2024-05-01 ENCOUNTER — Ambulatory Visit: Admitting: Podiatry

## 2024-05-14 ENCOUNTER — Ambulatory Visit (INDEPENDENT_AMBULATORY_CARE_PROVIDER_SITE_OTHER): Admitting: Podiatry

## 2024-05-14 ENCOUNTER — Encounter: Payer: Self-pay | Admitting: Podiatry

## 2024-05-14 DIAGNOSIS — B351 Tinea unguium: Secondary | ICD-10-CM | POA: Diagnosis not present

## 2024-05-14 DIAGNOSIS — M79674 Pain in right toe(s): Secondary | ICD-10-CM

## 2024-05-14 DIAGNOSIS — M79675 Pain in left toe(s): Secondary | ICD-10-CM

## 2024-05-14 NOTE — Progress Notes (Signed)
 Subjective: Sheila Hartman is a 73 y.o. female patient seen today for follow up of  painful thick toenails that are difficult to trim. Pain interferes with ambulation. Aggravating factors include wearing enclosed shoe gear. Pain is relieved with periodic professional debridement.   PCP is [place], Betty G, MD. Last visit was: 04/03/2021.  Allergies  Allergen Reactions   Penicillins Hives    Objective: Physical Exam  General: Patient is a pleasant 74 y.o. African American female morbidly obese in NAD. AAO x 3.   Neurovascular Examination: CFT immediate b/l LE. Palpable DP/PT pulses b/l LE. Digital hair sparse b/l. Skin temperature gradient WNL b/l. No pain with calf compression b/l. No edema noted b/l. No cyanosis or clubbing noted b/l LE.  Protective sensation intact 5/5 intact bilaterally with 10g monofilament b/l. Vibratory sensation intact b/l.  Dermatological:  Pedal integument with normal turgor, texture and tone b/l LE. No open wounds b/l. No interdigital macerations b/l. Toenails 1-5 b/l elongated, thickened, discolored with subungual debris. +Tenderness with dorsal palpation of nailplates. No hyperkeratotic or porokeratotic lesions present.  Musculoskeletal:  Normal muscle strength 5/5 to all lower extremity muscle groups bilaterally. HAV with bunion deformity noted b/l LE. Hammertoe(s) noted to the R 2nd toe.SABRA No pain, crepitus or joint limitation noted with ROM b/l LE.  Patient ambulates independently without assistive aids.  Assessment: 1. Pain due to onychomycosis of toenails of both feet       Plan: Patient was evaluated and treated and all questions answered. Consent given for treatment as described below: -Patient to continue soft, supportive shoe gear daily. -Mycotic toenails 1-5 bilaterally were debrided in length and girth with sterile nail nippers and dremel without incident. -Patient/POA to call should there be question/concern in the interim.   Return  3 months for rfc.   Return in about 3 months (around 08/14/2024) for rfc.  [place] Failing, DPM

## 2024-06-06 ENCOUNTER — Other Ambulatory Visit: Payer: Self-pay | Admitting: Family Medicine

## 2024-06-19 ENCOUNTER — Encounter: Payer: Self-pay | Admitting: Family Medicine

## 2024-06-19 ENCOUNTER — Ambulatory Visit: Admitting: Family Medicine

## 2024-06-19 DIAGNOSIS — Z0000 Encounter for general adult medical examination without abnormal findings: Secondary | ICD-10-CM | POA: Diagnosis not present

## 2024-06-19 NOTE — Progress Notes (Signed)
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 church, senior center or elsewhere  ADVANCED HEALTHCARE DIRECTIVES:  [place] Advanced Directives assistance:   ExpressWeek.com.cy  Everyone should have advanced health care directives in place. This is so that you get the care you want, should you ever be in a situation where you are unable to make your own medical decisions.   From the [place] Advanced Directive Website: Advance Health Care Directives are legal documents in which you give written instructions about your health care if, in the future, you cannot speak for yourself.   A health care power of attorney allows you to name a person you trust to make your health care decisions if you cannot make them yourself. A declaration of a desire for a natural  death (or living will) is document, which states that you desire not to have your life prolonged by extraordinary measures if you have a terminal or incurable illness or if you are in a vegetative state. An advance instruction for mental health treatment makes a declaration of instructions, information and preferences regarding your mental health treatment. It also states that you are aware that the advance instruction authorizes a mental health treatment provider to act according to your wishes. It may also outline your consent or refusal of mental health treatment. A declaration of an anatomical gift allows anyone over the age of 31 to make a gift by will, organ donor card or other document.   Please see the following website or an elder law attorney for forms, FAQs and for completion of advanced directives: [place]  Print production planner Health Care Directives Advance Health Care Directives ([email or web address]  Or copy and paste the following to your web browser: [email or web address]            Chiquita JONELLE Cramp, DO

## 2024-06-19 NOTE — Patient Instructions (Addendum)
 I really enjoyed getting to talk with you today! I am available on Tuesdays and Thursdays for virtual visits if you have any questions or concerns, or if I can be of any further assistance.   CHECKLIST FROM ANNUAL WELLNESS VISIT:  -Follow up (please call to schedule if not scheduled after visit):   -yearly for annual wellness visit with primary care office  Here is a list of your preventive care/health maintenance measures and the plan for each if any are due:  PLAN For any measures below that may be due:    1.  Can get vaccines at the pharmacy. Can get the flu vaccine at the office.   Health Maintenance  Topic Date Due   Influenza Vaccine  04/20/2024   COVID-19 Vaccine (3 - Pfizer risk series) 07/05/2024 (Originally 01/11/2020)   Zoster Vaccines- Shingrix (1 of 2) 09/18/2024 (Originally 05/24/1972)   Mammogram  12/27/2024   Medicare Annual Wellness (AWV)  06/19/2025   Colonoscopy  10/23/2027   Pneumococcal Vaccine: 50+ Years  Completed   DEXA SCAN  Completed   Hepatitis C Screening  Completed   HPV VACCINES  Aged Out   Meningococcal B Vaccine  Aged Out   DTaP/Tdap/Td  Discontinued    -See a dentist at least yearly  -Get your eyes checked and then per your eye specialist's recommendations  -Other issues addressed today:   -I have included below further information regarding a healthy whole foods based diet, physical activity guidelines for adults, stress management and opportunities for social connections. I hope you find this information useful.   -----------------------------------------------------------------------------------------------------------------------------------------------------------------------------------------------------------------------------------------------------------    NUTRITION: -eat real food: lots of colorful vegetables (half the plate) and fruits -5-7 servings of vegetables and fruits per day (fresh or steamed is best), exp. 2 servings of  vegetables with lunch and dinner and 2 servings of fruit per day. Berries and greens such as kale and collards are great choices.  -consume on a regular basis:  fresh fruits, fresh veggies, fish, nuts, seeds, healthy oils (such as olive oil, avocado oil), whole grains (make sure for bread/pasta/crackers/etc., that the first ingredient on label contains the word whole), legumes. -can eat small amounts of dairy and lean meat (no larger than the palm of your hand), but avoid processed meats such as ham, bacon, lunch meat, etc. -drink water -try to avoid fast food and pre-packaged foods, processed meat, ultra processed foods/beverages (donuts, candy, etc.) -most experts advise limiting sodium to < 2300mg  per day, should limit further is any chronic conditions such as high blood pressure, heart disease, diabetes, etc. The American Heart Association advised that < 1500mg  is is ideal -try to avoid foods/beverages that contain any ingredients with names you do not recognize  -try to avoid foods/beverages  with added sugar or sweeteners/sweets  -try to avoid sweet drinks (including diet drinks): soda, juice, Gatorade, sweet tea, power drinks, diet drinks -try to avoid white rice, white bread, pasta (unless whole grain)  EXERCISE GUIDELINES FOR ADULTS: -if you wish to increase your physical activity, do so gradually and with the approval of your doctor -STOP and seek medical care immediately if you have any chest pain, chest discomfort or trouble breathing when starting or increasing exercise  -move and stretch your body, legs, feet and arms when sitting for long periods -Physical activity guidelines for optimal health in adults: -get at least 150 minutes per week of moderate exercise (can talk, but not sing); this is about 20-30 minutes of sustained activity 5-7 days per  week or two 10-15 minute episodes of sustained activity 5-7 days per week -do some muscle building/resistance training/strength training  at least 2 days per week  -balance exercises 3+ days per week:   Stand somewhere where you have something sturdy to hold onto if you lose balance    1) lift up on toes, then back down, start with 5x per day and work up to 20x   2) stand and lift one leg straight out to the side so that foot is a few inches of the floor, start with 5x each side and work up to 20x each side   3) stand on one foot, start with 5 seconds each side and work up to 20 seconds on each side  If you need ideas or help with getting more active:  -Silver sneakers https://tools.silversneakers.com  -Walk with a Doc: http://www.duncan-williams.com/  -try to include resistance (weight lifting/strength building) and balance exercises twice per week: or the following link for ideas: http://castillo-powell.com/  BuyDucts.dk  STRESS MANAGEMENT: -can try meditating, or just sitting quietly with deep breathing while intentionally relaxing all parts of your body for 5 minutes daily -if you need further help with stress, anxiety or depression please follow up with your primary doctor or contact the wonderful folks at [facility]: [phone]  SOCIAL CONNECTIONS: -options in [place] if you wish to engage in more social and exercise related activities:  -Silver sneakers https://tools.silversneakers.com  -Walk with a Doc: http://www.duncan-williams.com/  -Check out the [facility] Active Adults 50+ section on the [place] [employer] (hiking clubs, book clubs, cards and games, chess, exercise classes, aquatic classes and much more) - see the website for details: https://www.[place].gov/departments/parks-recreation/active-adults50  -YouTube has lots of exercise videos for different ages and abilities as well  -[facility] (a variety of indoor and outdoor inperson activities for adults). [phone]. [address]  -Virtual Online Classes (a variety of topics): see seniorplanet.org or call [phone]  -consider volunteering at a school, hospice center, church, senior center or elsewhere  ADVANCED HEALTHCARE DIRECTIVES:  [place] Advanced Directives assistance:   ExpressWeek.com.cy  Everyone should have advanced health care directives in place. This is so that you get the care you want, should you ever be in a situation where you are unable to make your own medical decisions.   From the [place] Advanced Directive Website: Advance Health Care Directives are legal documents in which you give written instructions about your health care if, in the future, you cannot speak for yourself.   A health care power of attorney allows you to name a person you trust to make your health care decisions if you cannot make them yourself. A declaration of a desire for a natural death (or living will) is document, which states that you desire not to have your life prolonged by extraordinary measures if you have a terminal or incurable illness or if you are in a vegetative state. An advance instruction for mental health treatment makes a declaration of instructions, information and preferences regarding your mental health treatment. It also states that you are aware that the advance instruction authorizes a mental health treatment provider to act according to your wishes. It may also outline your consent or refusal of mental health treatment. A declaration of an anatomical gift allows anyone over the age of 32 to make a gift by will, organ donor card or other document.   Please see the following website or an elder law attorney for forms, FAQs and for  completion of advanced directives: [place]  Print production planner Health Care Directives Advance Health Care Directives ([email or web address]/)  Or copy and paste the following to your web  browser: [email or web address]

## 2024-06-19 NOTE — Progress Notes (Signed)
 Patient was unable to self-report due to a lack of equipment at home via telehealth

## 2024-07-12 ENCOUNTER — Other Ambulatory Visit: Payer: Self-pay | Admitting: Family Medicine

## 2024-07-18 ENCOUNTER — Other Ambulatory Visit: Payer: Self-pay | Admitting: Family Medicine

## 2024-07-18 MED ORDER — PRAVASTATIN SODIUM 10 MG PO TABS: 10.0000 mg | ORAL_TABLET | Freq: Every day | ORAL | 3 refills | Status: AC

## 2024-07-18 NOTE — Telephone Encounter (Signed)
 Copied from CRM [id number]. Topic: Clinical - Prescription Issue >> Jul 18, 2024 10:25 AM Nessti S wrote: Reason for CRM: pt needs script pravastatin  (PRAVACHOL ) 10 MG tablet written again. Call back number [phone]

## 2024-07-23 ENCOUNTER — Encounter: Payer: Self-pay | Admitting: Radiology

## 2024-08-20 ENCOUNTER — Ambulatory Visit: Admitting: Podiatry

## 2024-08-20 ENCOUNTER — Encounter: Payer: Self-pay | Admitting: Podiatry

## 2024-08-20 DIAGNOSIS — B351 Tinea unguium: Secondary | ICD-10-CM | POA: Diagnosis not present

## 2024-08-20 DIAGNOSIS — L853 Xerosis cutis: Secondary | ICD-10-CM

## 2024-08-20 DIAGNOSIS — M79674 Pain in right toe(s): Secondary | ICD-10-CM | POA: Diagnosis not present

## 2024-08-20 DIAGNOSIS — M79675 Pain in left toe(s): Secondary | ICD-10-CM

## 2024-08-20 MED ORDER — AMMONIUM LACTATE 12 % EX CREA: 1.0000 | TOPICAL_CREAM | CUTANEOUS | 5 refills | Status: AC | PRN
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
## 2024-10-02 ENCOUNTER — Other Ambulatory Visit: Payer: Self-pay | Admitting: Adult Health

## 2024-10-02 DIAGNOSIS — N6320 Unspecified lump in the left breast, unspecified quadrant: Secondary | ICD-10-CM

## 2024-11-28 ENCOUNTER — Ambulatory Visit: Admitting: Podiatry

## 2024-12-06 ENCOUNTER — Ambulatory Visit: Admitting: Hematology and Oncology

## 2024-12-31 ENCOUNTER — Encounter
